# Patient Record
Sex: Female | Born: 1945 | Race: Black or African American | Hispanic: No | Marital: Single | Smoking: Never smoker
Health system: Southern US, Community
[De-identification: ages and names within clinical notes are randomized; demographics above are authoritative.]

## PROBLEM LIST (undated history)

## (undated) DIAGNOSIS — C801 Malignant (primary) neoplasm, unspecified: Secondary | ICD-10-CM

## (undated) DIAGNOSIS — F329 Major depressive disorder, single episode, unspecified: Secondary | ICD-10-CM

## (undated) DIAGNOSIS — R196 Halitosis: Secondary | ICD-10-CM

## (undated) DIAGNOSIS — F32A Depression, unspecified: Secondary | ICD-10-CM

## (undated) DIAGNOSIS — R569 Unspecified convulsions: Secondary | ICD-10-CM

## (undated) DIAGNOSIS — N183 Chronic kidney disease, stage 3 unspecified: Secondary | ICD-10-CM

## (undated) DIAGNOSIS — I10 Essential (primary) hypertension: Secondary | ICD-10-CM

## (undated) DIAGNOSIS — M1990 Unspecified osteoarthritis, unspecified site: Secondary | ICD-10-CM

## (undated) DIAGNOSIS — R634 Abnormal weight loss: Secondary | ICD-10-CM

## (undated) DIAGNOSIS — I619 Nontraumatic intracerebral hemorrhage, unspecified: Secondary | ICD-10-CM

## (undated) DIAGNOSIS — B0239 Other herpes zoster eye disease: Secondary | ICD-10-CM

## (undated) DIAGNOSIS — F1411 Cocaine abuse, in remission: Secondary | ICD-10-CM

## (undated) DIAGNOSIS — E785 Hyperlipidemia, unspecified: Secondary | ICD-10-CM

## (undated) DIAGNOSIS — D649 Anemia, unspecified: Secondary | ICD-10-CM

## (undated) HISTORY — DX: Unspecified osteoarthritis, unspecified site: M19.90

## (undated) HISTORY — DX: Cocaine abuse, in remission: F14.11

## (undated) HISTORY — DX: Malignant (primary) neoplasm, unspecified: C80.1

## (undated) HISTORY — DX: Essential (primary) hypertension: I10

## (undated) HISTORY — DX: Halitosis: R19.6

## (undated) HISTORY — DX: Hyperlipidemia, unspecified: E78.5

## (undated) HISTORY — DX: Major depressive disorder, single episode, unspecified: F32.9

## (undated) HISTORY — DX: Other herpes zoster eye disease: B02.39

## (undated) HISTORY — DX: Chronic kidney disease, stage 3 (moderate): N18.3

## (undated) HISTORY — DX: Abnormal weight loss: R63.4

## (undated) HISTORY — DX: Chronic kidney disease, stage 3 unspecified: N18.30

## (undated) HISTORY — DX: Unspecified convulsions: R56.9

## (undated) HISTORY — DX: Anemia, unspecified: D64.9

## (undated) HISTORY — DX: Depression, unspecified: F32.A

## (undated) HISTORY — DX: Nontraumatic intracerebral hemorrhage, unspecified: I61.9

---

## 2003-02-08 ENCOUNTER — Inpatient Hospital Stay (HOSPITAL_COMMUNITY): Admission: EM | Admit: 2003-02-08 | Discharge: 2003-02-11 | Payer: Self-pay | Admitting: Emergency Medicine

## 2003-04-22 DIAGNOSIS — I619 Nontraumatic intracerebral hemorrhage, unspecified: Secondary | ICD-10-CM

## 2003-04-22 HISTORY — DX: Nontraumatic intracerebral hemorrhage, unspecified: I61.9

## 2003-05-11 ENCOUNTER — Inpatient Hospital Stay (HOSPITAL_COMMUNITY): Admission: EM | Admit: 2003-05-11 | Discharge: 2003-05-20 | Payer: Self-pay | Admitting: Emergency Medicine

## 2003-05-12 ENCOUNTER — Encounter (INDEPENDENT_AMBULATORY_CARE_PROVIDER_SITE_OTHER): Payer: Self-pay | Admitting: Cardiology

## 2003-05-20 ENCOUNTER — Inpatient Hospital Stay (HOSPITAL_COMMUNITY)
Admission: RE | Admit: 2003-05-20 | Discharge: 2003-06-05 | Payer: Self-pay | Admitting: Physical Medicine & Rehabilitation

## 2003-05-21 ENCOUNTER — Encounter: Admission: RE | Admit: 2003-05-21 | Discharge: 2003-05-21 | Payer: Self-pay | Admitting: Family Medicine

## 2003-07-16 ENCOUNTER — Encounter
Admission: RE | Admit: 2003-07-16 | Discharge: 2003-10-14 | Payer: Self-pay | Admitting: Physical Medicine & Rehabilitation

## 2003-10-14 ENCOUNTER — Encounter
Admission: RE | Admit: 2003-10-14 | Discharge: 2003-12-12 | Payer: Self-pay | Admitting: Physical Medicine & Rehabilitation

## 2003-12-01 ENCOUNTER — Encounter
Admission: RE | Admit: 2003-12-01 | Discharge: 2003-12-12 | Payer: Self-pay | Admitting: Physical Medicine & Rehabilitation

## 2003-12-02 ENCOUNTER — Ambulatory Visit: Payer: Self-pay | Admitting: Physical Medicine & Rehabilitation

## 2004-01-29 ENCOUNTER — Ambulatory Visit: Payer: Self-pay | Admitting: Nurse Practitioner

## 2004-02-24 ENCOUNTER — Ambulatory Visit (HOSPITAL_COMMUNITY): Admission: RE | Admit: 2004-02-24 | Discharge: 2004-02-24 | Payer: Self-pay | Admitting: Family Medicine

## 2004-03-01 ENCOUNTER — Ambulatory Visit: Payer: Self-pay | Admitting: Nurse Practitioner

## 2004-03-01 ENCOUNTER — Encounter (INDEPENDENT_AMBULATORY_CARE_PROVIDER_SITE_OTHER): Payer: Self-pay | Admitting: Hospitalist

## 2004-03-01 ENCOUNTER — Other Ambulatory Visit: Admission: RE | Admit: 2004-03-01 | Discharge: 2004-03-01 | Payer: Self-pay | Admitting: Family Medicine

## 2004-03-01 LAB — CONVERTED CEMR LAB: Pap Smear: NORMAL

## 2004-05-24 ENCOUNTER — Encounter
Admission: RE | Admit: 2004-05-24 | Discharge: 2004-08-22 | Payer: Self-pay | Admitting: Physical Medicine & Rehabilitation

## 2004-06-10 ENCOUNTER — Ambulatory Visit: Payer: Self-pay | Admitting: Nurse Practitioner

## 2004-06-25 ENCOUNTER — Ambulatory Visit: Payer: Self-pay | Admitting: Physical Medicine & Rehabilitation

## 2004-08-26 ENCOUNTER — Ambulatory Visit: Payer: Self-pay | Admitting: Nurse Practitioner

## 2005-03-30 ENCOUNTER — Ambulatory Visit (HOSPITAL_COMMUNITY): Admission: RE | Admit: 2005-03-30 | Discharge: 2005-03-30 | Payer: Self-pay | Admitting: Family Medicine

## 2005-04-06 ENCOUNTER — Ambulatory Visit: Payer: Self-pay | Admitting: Nurse Practitioner

## 2005-07-05 ENCOUNTER — Ambulatory Visit: Payer: Self-pay | Admitting: Nurse Practitioner

## 2005-09-19 ENCOUNTER — Ambulatory Visit (HOSPITAL_BASED_OUTPATIENT_CLINIC_OR_DEPARTMENT_OTHER): Admission: RE | Admit: 2005-09-19 | Discharge: 2005-09-19 | Payer: Self-pay | Admitting: Ophthalmology

## 2005-10-03 ENCOUNTER — Ambulatory Visit (HOSPITAL_BASED_OUTPATIENT_CLINIC_OR_DEPARTMENT_OTHER): Admission: RE | Admit: 2005-10-03 | Discharge: 2005-10-03 | Payer: Self-pay | Admitting: Ophthalmology

## 2005-10-25 ENCOUNTER — Ambulatory Visit: Payer: Self-pay | Admitting: Nurse Practitioner

## 2005-10-28 ENCOUNTER — Ambulatory Visit (HOSPITAL_COMMUNITY): Admission: RE | Admit: 2005-10-28 | Discharge: 2005-10-28 | Payer: Self-pay | Admitting: Nurse Practitioner

## 2006-03-01 ENCOUNTER — Ambulatory Visit: Payer: Self-pay | Admitting: Hospitalist

## 2006-03-01 LAB — CONVERTED CEMR LAB
BUN: 35 mg/dL — ABNORMAL HIGH (ref 6–23)
CO2: 27 meq/L (ref 19–32)
Calcium: 10.4 mg/dL (ref 8.4–10.5)
Chloride: 102 meq/L (ref 96–112)
Creatinine, Ser: 1.38 mg/dL — ABNORMAL HIGH (ref 0.40–1.20)
Glucose, Bld: 94 mg/dL (ref 70–99)
Potassium: 3.9 meq/L (ref 3.5–5.3)
Sodium: 140 meq/L (ref 135–145)

## 2006-03-07 ENCOUNTER — Encounter (INDEPENDENT_AMBULATORY_CARE_PROVIDER_SITE_OTHER): Payer: Self-pay | Admitting: Hospitalist

## 2006-03-07 DIAGNOSIS — Z9189 Other specified personal risk factors, not elsewhere classified: Secondary | ICD-10-CM | POA: Insufficient documentation

## 2006-03-07 DIAGNOSIS — I10 Essential (primary) hypertension: Secondary | ICD-10-CM | POA: Insufficient documentation

## 2006-03-07 DIAGNOSIS — G8190 Hemiplegia, unspecified affecting unspecified side: Secondary | ICD-10-CM | POA: Insufficient documentation

## 2006-03-07 DIAGNOSIS — D509 Iron deficiency anemia, unspecified: Secondary | ICD-10-CM

## 2006-03-07 DIAGNOSIS — E785 Hyperlipidemia, unspecified: Secondary | ICD-10-CM

## 2006-03-08 ENCOUNTER — Encounter (INDEPENDENT_AMBULATORY_CARE_PROVIDER_SITE_OTHER): Payer: Self-pay | Admitting: *Deleted

## 2006-03-08 ENCOUNTER — Ambulatory Visit: Payer: Self-pay | Admitting: *Deleted

## 2006-03-08 LAB — CONVERTED CEMR LAB
BUN: 26 mg/dL — ABNORMAL HIGH (ref 6–23)
CO2: 27 meq/L (ref 19–32)
Calcium: 10.2 mg/dL (ref 8.4–10.5)
Chloride: 104 meq/L (ref 96–112)
Creatinine, Ser: 1.46 mg/dL — ABNORMAL HIGH (ref 0.40–1.20)
Glucose, Bld: 89 mg/dL (ref 70–99)
Potassium: 4 meq/L (ref 3.5–5.3)
Sodium: 141 meq/L (ref 135–145)

## 2006-05-01 ENCOUNTER — Ambulatory Visit (HOSPITAL_COMMUNITY): Admission: RE | Admit: 2006-05-01 | Discharge: 2006-05-01 | Payer: Self-pay | Admitting: Obstetrics and Gynecology

## 2006-08-22 ENCOUNTER — Telehealth (INDEPENDENT_AMBULATORY_CARE_PROVIDER_SITE_OTHER): Payer: Self-pay | Admitting: *Deleted

## 2006-08-28 ENCOUNTER — Telehealth: Payer: Self-pay | Admitting: *Deleted

## 2006-09-15 ENCOUNTER — Ambulatory Visit: Payer: Self-pay | Admitting: Internal Medicine

## 2006-09-15 ENCOUNTER — Encounter (INDEPENDENT_AMBULATORY_CARE_PROVIDER_SITE_OTHER): Payer: Self-pay | Admitting: *Deleted

## 2006-09-18 LAB — CONVERTED CEMR LAB
ALT: <8 units/L (ref 0–35)
AST: 14 units/L (ref 0–37)
Albumin: 5.1 g/dL (ref 3.5–5.2)
Alkaline Phosphatase: 57 units/L (ref 39–117)
Amphetamine Screen, Ur: NEGATIVE
BUN: 35 mg/dL — ABNORMAL HIGH (ref 6–23)
Barbiturate Quant, Ur: NEGATIVE
Benzodiazepines.: NEGATIVE
CO2: 26 meq/L (ref 19–32)
Calcium: 10.7 mg/dL — ABNORMAL HIGH (ref 8.4–10.5)
Chloride: 103 meq/L (ref 96–112)
Cholesterol: 202 mg/dL — ABNORMAL HIGH (ref 0–200)
Cocaine Metabolites: NEGATIVE
Creatinine, Ser: 1.45 mg/dL — ABNORMAL HIGH (ref 0.40–1.20)
Creatinine,U: 203.9 mg/dL
Glucose, Bld: 90 mg/dL (ref 70–99)
HDL: 72 mg/dL (ref >39)
LDL Cholesterol: 112 mg/dL — ABNORMAL HIGH (ref 0–99)
Marijuana Metabolite: NEGATIVE
Methadone: NEGATIVE
Opiates: NEGATIVE
Phencyclidine (PCP): NEGATIVE
Potassium: 3.9 meq/L (ref 3.5–5.3)
Propoxyphene: NEGATIVE
Sodium: 141 meq/L (ref 135–145)
Total Bilirubin: 0.8 mg/dL (ref 0.3–1.2)
Total CHOL/HDL Ratio: 2.8
Total Protein: 8.4 g/dL — ABNORMAL HIGH (ref 6.0–8.3)
Triglycerides: 92 mg/dL (ref <150)
VLDL: 18 mg/dL (ref 0–40)

## 2006-11-21 ENCOUNTER — Ambulatory Visit: Payer: Self-pay | Admitting: Hospitalist

## 2006-11-21 LAB — CONVERTED CEMR LAB
BUN: 22 mg/dL (ref 6–23)
Basophils Absolute: 0 10*3/uL (ref 0.0–0.1)
Basophils Relative: 1 % (ref 0–1)
CO2: 22 meq/L (ref 19–32)
Calcium: 10.5 mg/dL (ref 8.4–10.5)
Chloride: 102 meq/L (ref 96–112)
Creatinine, Ser: 1.34 mg/dL — ABNORMAL HIGH (ref 0.40–1.20)
Eosinophils Absolute: 0.1 10*3/uL (ref 0.0–0.7)
Eosinophils Relative: 1 % (ref 0–5)
Glucose, Bld: 85 mg/dL (ref 70–99)
HCT: 36.9 % (ref 36.0–46.0)
Hemoglobin: 11.8 g/dL — ABNORMAL LOW (ref 12.0–15.0)
Lymphocytes Relative: 27 % (ref 12–46)
Lymphs Abs: 1.4 10*3/uL (ref 0.7–3.3)
MCHC: 32 g/dL (ref 30.0–36.0)
MCV: 92 fL (ref 78.0–100.0)
Monocytes Absolute: 0.5 10*3/uL (ref 0.2–0.7)
Monocytes Relative: 9 % (ref 3–11)
Neutro Abs: 3.2 10*3/uL (ref 1.7–7.7)
Neutrophils Relative %: 62 % (ref 43–77)
Platelets: 209 10*3/uL (ref 150–400)
Potassium: 3.6 meq/L (ref 3.5–5.3)
RBC: 4.01 M/uL (ref 3.87–5.11)
RDW: 12.7 % (ref 11.5–14.0)
Sodium: 142 meq/L (ref 135–145)
WBC: 5.2 10*3/uL (ref 4.0–10.5)

## 2007-02-08 ENCOUNTER — Ambulatory Visit: Payer: Self-pay | Admitting: Hospitalist

## 2007-02-09 ENCOUNTER — Telehealth: Payer: Self-pay | Admitting: *Deleted

## 2007-02-09 ENCOUNTER — Telehealth (INDEPENDENT_AMBULATORY_CARE_PROVIDER_SITE_OTHER): Payer: Self-pay | Admitting: Hospitalist

## 2007-03-06 ENCOUNTER — Ambulatory Visit: Payer: Self-pay | Admitting: Hospitalist

## 2007-03-06 LAB — CONVERTED CEMR LAB
ALT: 10 units/L (ref 0–35)
AST: 23 units/L (ref 0–37)
Albumin: 4.9 g/dL (ref 3.5–5.2)
Alkaline Phosphatase: 78 units/L (ref 39–117)
BUN: 25 mg/dL — ABNORMAL HIGH (ref 6–23)
CO2: 21 meq/L (ref 19–32)
Calcium: 10.4 mg/dL (ref 8.4–10.5)
Chloride: 103 meq/L (ref 96–112)
Cholesterol: 204 mg/dL — ABNORMAL HIGH (ref 0–200)
Creatinine, Ser: 1.2 mg/dL (ref 0.40–1.20)
Glucose, Bld: 86 mg/dL (ref 70–99)
HDL: 72 mg/dL (ref >39)
LDL Cholesterol: 117 mg/dL — ABNORMAL HIGH (ref 0–99)
Potassium: 4.1 meq/L (ref 3.5–5.3)
Sodium: 142 meq/L (ref 135–145)
TSH: 3.008 microintl units/mL (ref 0.350–5.50)
Total Bilirubin: 0.8 mg/dL (ref 0.3–1.2)
Total CHOL/HDL Ratio: 2.8
Total Protein: 8.5 g/dL — ABNORMAL HIGH (ref 6.0–8.3)
Triglycerides: 73 mg/dL (ref <150)
VLDL: 15 mg/dL (ref 0–40)

## 2007-05-03 ENCOUNTER — Ambulatory Visit: Payer: Self-pay | Admitting: Hospitalist

## 2007-06-07 ENCOUNTER — Ambulatory Visit: Payer: Self-pay | Admitting: Hospitalist

## 2007-06-13 ENCOUNTER — Ambulatory Visit (HOSPITAL_COMMUNITY): Admission: RE | Admit: 2007-06-13 | Discharge: 2007-06-13 | Payer: Self-pay | Admitting: Hospitalist

## 2007-06-20 DIAGNOSIS — B0230 Zoster ocular disease, unspecified: Secondary | ICD-10-CM

## 2007-06-20 HISTORY — DX: Zoster ocular disease, unspecified: B02.30

## 2007-07-12 ENCOUNTER — Ambulatory Visit: Payer: Self-pay | Admitting: Hospitalist

## 2007-07-12 LAB — CONVERTED CEMR LAB
BUN: 22 mg/dL (ref 6–23)
CO2: 25 meq/L (ref 19–32)
Calcium: 10.5 mg/dL (ref 8.4–10.5)
Cholesterol: 166 mg/dL (ref 0–200)
Creatinine, Ser: 1.2 mg/dL (ref 0.40–1.20)
Glucose, Bld: 96 mg/dL (ref 70–99)
Total Bilirubin: 0.6 mg/dL (ref 0.3–1.2)
Total CHOL/HDL Ratio: 2.4
Triglycerides: 64 mg/dL (ref <150)
VLDL: 13 mg/dL (ref 0–40)

## 2007-07-17 ENCOUNTER — Ambulatory Visit: Payer: Self-pay | Admitting: Hospitalist

## 2007-09-12 ENCOUNTER — Ambulatory Visit: Payer: Self-pay | Admitting: Hospitalist

## 2007-11-05 ENCOUNTER — Telehealth: Payer: Self-pay | Admitting: *Deleted

## 2007-11-09 ENCOUNTER — Ambulatory Visit: Payer: Self-pay | Admitting: *Deleted

## 2007-12-13 ENCOUNTER — Encounter: Payer: Self-pay | Admitting: Internal Medicine

## 2007-12-13 ENCOUNTER — Ambulatory Visit: Payer: Self-pay | Admitting: Internal Medicine

## 2007-12-14 LAB — CONVERTED CEMR LAB
AST: 17 units/L (ref 0–37)
Alkaline Phosphatase: 62 units/L (ref 39–117)
BUN: 17 mg/dL (ref 6–23)
Calcium: 10.8 mg/dL — ABNORMAL HIGH (ref 8.4–10.5)
Creatinine, Ser: 1.19 mg/dL (ref 0.40–1.20)
Glucose, Bld: 90 mg/dL (ref 70–99)
HDL: 75 mg/dL (ref >39)
Total CHOL/HDL Ratio: 2.2
Triglycerides: 66 mg/dL (ref <150)

## 2007-12-27 ENCOUNTER — Ambulatory Visit: Payer: Self-pay | Admitting: Internal Medicine

## 2007-12-27 ENCOUNTER — Encounter: Payer: Self-pay | Admitting: Internal Medicine

## 2007-12-28 DIAGNOSIS — N189 Chronic kidney disease, unspecified: Secondary | ICD-10-CM | POA: Insufficient documentation

## 2007-12-28 LAB — CONVERTED CEMR LAB
CO2: 29 meq/L (ref 19–32)
Calcium: 10.8 mg/dL — ABNORMAL HIGH (ref 8.4–10.5)
Creatinine, Ser: 1.66 mg/dL — ABNORMAL HIGH (ref 0.40–1.20)
Glucose, Bld: 91 mg/dL (ref 70–99)
Sodium: 145 meq/L (ref 135–145)

## 2008-01-01 ENCOUNTER — Encounter: Payer: Self-pay | Admitting: Internal Medicine

## 2008-01-01 ENCOUNTER — Ambulatory Visit: Payer: Self-pay | Admitting: Internal Medicine

## 2008-01-01 ENCOUNTER — Encounter (INDEPENDENT_AMBULATORY_CARE_PROVIDER_SITE_OTHER): Payer: Self-pay | Admitting: *Deleted

## 2008-01-03 LAB — CONVERTED CEMR LAB
CO2: 25 meq/L (ref 19–32)
Calcium: 10.2 mg/dL (ref 8.4–10.5)
Creatinine, Ser: 1.35 mg/dL — ABNORMAL HIGH (ref 0.40–1.20)
Sodium: 144 meq/L (ref 135–145)

## 2008-01-16 ENCOUNTER — Telehealth: Payer: Self-pay | Admitting: *Deleted

## 2008-01-29 ENCOUNTER — Ambulatory Visit: Payer: Self-pay | Admitting: *Deleted

## 2008-01-29 LAB — CONVERTED CEMR LAB
BUN: 21 mg/dL (ref 6–23)
CO2: 29 meq/L (ref 19–32)
Chloride: 107 meq/L (ref 96–112)
Creatinine, Ser: 1.22 mg/dL — ABNORMAL HIGH (ref 0.40–1.20)
Glucose, Bld: 108 mg/dL — ABNORMAL HIGH (ref 70–99)
HCT: 36.3 % (ref 36.0–46.0)
Hemoglobin: 12.1 g/dL (ref 12.0–15.0)
Potassium: 3.2 meq/L — ABNORMAL LOW (ref 3.5–5.3)
RBC: 3.87 M/uL (ref 3.87–5.11)
RDW: 13.8 % (ref 11.5–15.5)
WBC: 5 10*3/uL (ref 4.0–10.5)

## 2008-02-07 ENCOUNTER — Encounter (INDEPENDENT_AMBULATORY_CARE_PROVIDER_SITE_OTHER): Payer: Self-pay | Admitting: *Deleted

## 2008-03-04 ENCOUNTER — Ambulatory Visit: Payer: Self-pay | Admitting: *Deleted

## 2008-03-04 LAB — CONVERTED CEMR LAB
BUN: 22 mg/dL (ref 6–23)
Barbiturate Quant, Ur: NEGATIVE
CO2: 21 meq/L (ref 19–32)
Chloride: 110 meq/L (ref 96–112)
Creatinine, Ser: 1.13 mg/dL (ref 0.40–1.20)
Creatinine, Urine: 287.3 mg/dL
Creatinine,U: 285.4 mg/dL
Glucose, Bld: 90 mg/dL (ref 70–99)
Marijuana Metabolite: NEGATIVE
Opiates: NEGATIVE
Phencyclidine (PCP): NEGATIVE
Potassium: 3.6 meq/L (ref 3.5–5.3)
Propoxyphene: NEGATIVE

## 2008-03-11 ENCOUNTER — Ambulatory Visit: Payer: Self-pay | Admitting: Internal Medicine

## 2008-03-27 ENCOUNTER — Encounter (INDEPENDENT_AMBULATORY_CARE_PROVIDER_SITE_OTHER): Payer: Self-pay | Admitting: *Deleted

## 2008-03-27 ENCOUNTER — Ambulatory Visit: Payer: Self-pay | Admitting: Infectious Disease

## 2008-03-27 LAB — CONVERTED CEMR LAB
BUN: 21 mg/dL (ref 6–23)
Calcium: 10.2 mg/dL (ref 8.4–10.5)
Chloride: 106 meq/L (ref 96–112)
Creatinine, Ser: 1.19 mg/dL (ref 0.40–1.20)

## 2008-04-02 ENCOUNTER — Encounter (INDEPENDENT_AMBULATORY_CARE_PROVIDER_SITE_OTHER): Payer: Self-pay | Admitting: Internal Medicine

## 2008-04-11 ENCOUNTER — Encounter: Payer: Self-pay | Admitting: Internal Medicine

## 2008-04-11 ENCOUNTER — Encounter (INDEPENDENT_AMBULATORY_CARE_PROVIDER_SITE_OTHER): Payer: Self-pay | Admitting: *Deleted

## 2008-04-11 ENCOUNTER — Encounter: Admission: RE | Admit: 2008-04-11 | Discharge: 2008-04-11 | Payer: Self-pay | Admitting: Gastroenterology

## 2008-04-11 DIAGNOSIS — D499 Neoplasm of unspecified behavior of unspecified site: Secondary | ICD-10-CM | POA: Insufficient documentation

## 2008-04-16 ENCOUNTER — Ambulatory Visit: Payer: Self-pay | Admitting: Internal Medicine

## 2008-04-16 ENCOUNTER — Encounter (INDEPENDENT_AMBULATORY_CARE_PROVIDER_SITE_OTHER): Payer: Self-pay | Admitting: *Deleted

## 2008-04-17 ENCOUNTER — Telehealth: Payer: Self-pay | Admitting: Internal Medicine

## 2008-04-17 ENCOUNTER — Telehealth (INDEPENDENT_AMBULATORY_CARE_PROVIDER_SITE_OTHER): Payer: Self-pay | Admitting: Internal Medicine

## 2008-04-29 ENCOUNTER — Ambulatory Visit (HOSPITAL_COMMUNITY): Admission: RE | Admit: 2008-04-29 | Discharge: 2008-04-29 | Payer: Self-pay | Admitting: *Deleted

## 2008-04-29 ENCOUNTER — Ambulatory Visit: Payer: Self-pay | Admitting: *Deleted

## 2008-05-02 ENCOUNTER — Ambulatory Visit: Payer: Self-pay | Admitting: Hematology & Oncology

## 2008-05-07 ENCOUNTER — Encounter (INDEPENDENT_AMBULATORY_CARE_PROVIDER_SITE_OTHER): Payer: Self-pay | Admitting: *Deleted

## 2008-05-13 ENCOUNTER — Ambulatory Visit: Payer: Self-pay | Admitting: *Deleted

## 2008-05-21 ENCOUNTER — Encounter (INDEPENDENT_AMBULATORY_CARE_PROVIDER_SITE_OTHER): Payer: Self-pay | Admitting: *Deleted

## 2008-05-21 LAB — CBC WITH DIFFERENTIAL (CANCER CENTER ONLY)
BASO#: 0 10*3/uL (ref 0.0–0.2)
EOS%: 1.1 % (ref 0.0–7.0)
Eosinophils Absolute: 0.1 10*3/uL (ref 0.0–0.5)
HGB: 13.1 g/dL (ref 11.6–15.9)
LYMPH%: 21.7 % (ref 14.0–48.0)
MCH: 30.4 pg (ref 26.0–34.0)
MCHC: 32.8 g/dL (ref 32.0–36.0)
MCV: 93 fL (ref 81–101)
MONO%: 5.1 % (ref 0.0–13.0)
NEUT#: 4.5 10*3/uL (ref 1.5–6.5)
Platelets: 241 10*3/uL (ref 145–400)
RBC: 4.31 10*6/uL (ref 3.70–5.32)

## 2008-05-27 LAB — COMPREHENSIVE METABOLIC PANEL
AST: 19 U/L (ref 0–37)
Alkaline Phosphatase: 66 U/L (ref 39–117)
BUN: 21 mg/dL (ref 6–23)
Calcium: 10.7 mg/dL — ABNORMAL HIGH (ref 8.4–10.5)
Creatinine, Ser: 1.08 mg/dL (ref 0.40–1.20)
Total Bilirubin: 0.7 mg/dL (ref 0.3–1.2)

## 2008-05-28 ENCOUNTER — Encounter (INDEPENDENT_AMBULATORY_CARE_PROVIDER_SITE_OTHER): Payer: Self-pay | Admitting: General Surgery

## 2008-05-28 ENCOUNTER — Inpatient Hospital Stay (HOSPITAL_COMMUNITY): Admission: RE | Admit: 2008-05-28 | Discharge: 2008-06-02 | Payer: Self-pay | Admitting: General Surgery

## 2008-06-02 LAB — 5 HIAA, QUANTITATIVE, URINE, 24 HOUR
5-HIAA, 24 Hr Urine: 219.9 mg/24 h — ABNORMAL HIGH (ref <=6.0)
Volume, Urine-5HIAA: 550 mL/24 h

## 2008-06-18 ENCOUNTER — Ambulatory Visit: Payer: Self-pay | Admitting: Hematology & Oncology

## 2008-06-19 ENCOUNTER — Encounter (INDEPENDENT_AMBULATORY_CARE_PROVIDER_SITE_OTHER): Payer: Self-pay | Admitting: *Deleted

## 2008-06-19 LAB — CBC WITH DIFFERENTIAL (CANCER CENTER ONLY)
BASO#: 0 10*3/uL (ref 0.0–0.2)
Eosinophils Absolute: 0.1 10*3/uL (ref 0.0–0.5)
HCT: 33.3 % — ABNORMAL LOW (ref 34.8–46.6)
HGB: 10.9 g/dL — ABNORMAL LOW (ref 11.6–15.9)
LYMPH#: 1.6 10*3/uL (ref 0.9–3.3)
MCHC: 32.7 g/dL (ref 32.0–36.0)
MONO#: 0.3 10*3/uL (ref 0.1–0.9)
NEUT#: 2.8 10*3/uL (ref 1.5–6.5)
NEUT%: 58.4 % (ref 39.6–80.0)
RBC: 3.59 10*6/uL — ABNORMAL LOW (ref 3.70–5.32)
WBC: 4.8 10*3/uL (ref 3.9–10.0)

## 2008-06-24 ENCOUNTER — Ambulatory Visit: Payer: Self-pay | Admitting: *Deleted

## 2008-06-26 LAB — COMPREHENSIVE METABOLIC PANEL
ALT: 12 U/L (ref 0–35)
AST: 13 U/L (ref 0–37)
Albumin: 4.5 g/dL (ref 3.5–5.2)
Alkaline Phosphatase: 56 U/L (ref 39–117)
BUN: 20 mg/dL (ref 6–23)
Chloride: 105 mEq/L (ref 96–112)
Potassium: 3.5 mEq/L (ref 3.5–5.3)
Sodium: 139 mEq/L (ref 135–145)
Total Protein: 7.1 g/dL (ref 6.0–8.3)

## 2008-07-09 ENCOUNTER — Ambulatory Visit (HOSPITAL_COMMUNITY): Admission: RE | Admit: 2008-07-09 | Discharge: 2008-07-09 | Payer: Self-pay | Admitting: Internal Medicine

## 2008-07-09 LAB — HM MAMMOGRAPHY: HM Mammogram: NEGATIVE

## 2008-07-17 ENCOUNTER — Encounter (INDEPENDENT_AMBULATORY_CARE_PROVIDER_SITE_OTHER): Payer: Self-pay | Admitting: *Deleted

## 2008-07-17 LAB — COMPREHENSIVE METABOLIC PANEL
Albumin: 4.9 g/dL (ref 3.5–5.2)
Alkaline Phosphatase: 77 U/L (ref 39–117)
BUN: 16 mg/dL (ref 6–23)
Creatinine, Ser: 0.97 mg/dL (ref 0.40–1.20)
Glucose, Bld: 86 mg/dL (ref 70–99)
Potassium: 3.7 mEq/L (ref 3.5–5.3)

## 2008-07-17 LAB — CBC WITH DIFFERENTIAL (CANCER CENTER ONLY)
BASO#: 0 10*3/uL (ref 0.0–0.2)
EOS%: 1.3 % (ref 0.0–7.0)
Eosinophils Absolute: 0.1 10*3/uL (ref 0.0–0.5)
HCT: 36.5 % (ref 34.8–46.6)
HGB: 12.2 g/dL (ref 11.6–15.9)
MCH: 30.4 pg (ref 26.0–34.0)
MCHC: 33.3 g/dL (ref 32.0–36.0)
MONO%: 4.8 % (ref 0.0–13.0)
NEUT#: 3.1 10*3/uL (ref 1.5–6.5)
NEUT%: 66.7 % (ref 39.6–80.0)
RBC: 4 10*6/uL (ref 3.70–5.32)

## 2008-07-21 ENCOUNTER — Telehealth: Payer: Self-pay | Admitting: *Deleted

## 2008-08-13 ENCOUNTER — Ambulatory Visit: Payer: Self-pay | Admitting: Hematology & Oncology

## 2008-08-14 ENCOUNTER — Encounter (INDEPENDENT_AMBULATORY_CARE_PROVIDER_SITE_OTHER): Payer: Self-pay | Admitting: *Deleted

## 2008-08-14 LAB — CBC WITH DIFFERENTIAL (CANCER CENTER ONLY)
BASO%: 0.4 % (ref 0.0–2.0)
EOS%: 1.3 % (ref 0.0–7.0)
HCT: 37.3 % (ref 34.8–46.6)
LYMPH#: 1.7 10*3/uL (ref 0.9–3.3)
MCHC: 33.4 g/dL (ref 32.0–36.0)
MONO#: 0.3 10*3/uL (ref 0.1–0.9)
NEUT#: 3.4 10*3/uL (ref 1.5–6.5)
NEUT%: 61.9 % (ref 39.6–80.0)
RDW: 12.1 % (ref 10.5–14.6)
WBC: 5.5 10*3/uL (ref 3.9–10.0)

## 2008-08-20 LAB — LACTATE DEHYDROGENASE: LDH: 164 U/L (ref 94–250)

## 2008-08-20 LAB — COMPREHENSIVE METABOLIC PANEL
ALT: 17 U/L (ref 0–35)
CO2: 19 mEq/L (ref 19–32)
Creatinine, Ser: 1.22 mg/dL — ABNORMAL HIGH (ref 0.40–1.20)
Total Bilirubin: 0.6 mg/dL (ref 0.3–1.2)

## 2008-08-20 LAB — CHROMOGRANIN A: Chromogranin A: 149.3 ng/mL — ABNORMAL HIGH (ref <=36.4)

## 2008-09-05 ENCOUNTER — Telehealth: Payer: Self-pay | Admitting: *Deleted

## 2008-09-16 ENCOUNTER — Ambulatory Visit: Payer: Self-pay | Admitting: Hematology & Oncology

## 2008-10-07 ENCOUNTER — Telehealth (INDEPENDENT_AMBULATORY_CARE_PROVIDER_SITE_OTHER): Payer: Self-pay | Admitting: Internal Medicine

## 2008-10-15 ENCOUNTER — Ambulatory Visit: Payer: Self-pay | Admitting: Internal Medicine

## 2008-10-15 ENCOUNTER — Encounter (INDEPENDENT_AMBULATORY_CARE_PROVIDER_SITE_OTHER): Payer: Self-pay | Admitting: Internal Medicine

## 2008-10-16 ENCOUNTER — Ambulatory Visit: Payer: Self-pay | Admitting: Vascular Surgery

## 2008-10-16 ENCOUNTER — Encounter (INDEPENDENT_AMBULATORY_CARE_PROVIDER_SITE_OTHER): Payer: Self-pay | Admitting: Internal Medicine

## 2008-10-16 ENCOUNTER — Ambulatory Visit (HOSPITAL_COMMUNITY): Admission: RE | Admit: 2008-10-16 | Discharge: 2008-10-16 | Payer: Self-pay | Admitting: Internal Medicine

## 2008-10-16 LAB — CONVERTED CEMR LAB
BUN: 14 mg/dL (ref 6–23)
CO2: 26 meq/L (ref 19–32)
Calcium: 9.9 mg/dL (ref 8.4–10.5)
Chloride: 103 meq/L (ref 96–112)
Creatinine, Ser: 1.14 mg/dL (ref 0.40–1.20)
Glucose, Bld: 99 mg/dL (ref 70–99)
HCT: 37.3 % (ref 36.0–46.0)
HDL: 73 mg/dL (ref >39)
Hemoglobin: 12.4 g/dL (ref 12.0–15.0)
LDL Cholesterol: 74 mg/dL (ref 0–99)
Platelets: 254 10*3/uL (ref 150–400)
RBC: 4.15 M/uL (ref 3.87–5.11)
Total Bilirubin: 0.8 mg/dL (ref 0.3–1.2)
Total CHOL/HDL Ratio: 2.2
VLDL: 17 mg/dL (ref 0–40)
WBC: 5.5 10*3/uL (ref 4.0–10.5)

## 2008-11-05 ENCOUNTER — Ambulatory Visit: Payer: Self-pay | Admitting: Hematology & Oncology

## 2008-11-06 ENCOUNTER — Encounter (INDEPENDENT_AMBULATORY_CARE_PROVIDER_SITE_OTHER): Payer: Self-pay | Admitting: Internal Medicine

## 2008-11-06 LAB — CBC WITH DIFFERENTIAL (CANCER CENTER ONLY)
BASO%: 0.5 % (ref 0.0–2.0)
EOS%: 1.6 % (ref 0.0–7.0)
LYMPH#: 1.4 10*3/uL (ref 0.9–3.3)
LYMPH%: 27.9 % (ref 14.0–48.0)
MCHC: 34 g/dL (ref 32.0–36.0)
MCV: 90 fL (ref 81–101)
MONO#: 0.2 10*3/uL (ref 0.1–0.9)
Platelets: 220 10*3/uL (ref 145–400)
RDW: 13.1 % (ref 10.5–14.6)
WBC: 4.9 10*3/uL (ref 3.9–10.0)

## 2008-11-14 LAB — COMPREHENSIVE METABOLIC PANEL
Albumin: 4.6 g/dL (ref 3.5–5.2)
Alkaline Phosphatase: 93 U/L (ref 39–117)
CO2: 24 mEq/L (ref 19–32)
Calcium: 10.3 mg/dL (ref 8.4–10.5)
Chloride: 106 mEq/L (ref 96–112)
Glucose, Bld: 137 mg/dL — ABNORMAL HIGH (ref 70–99)
Potassium: 3.4 mEq/L — ABNORMAL LOW (ref 3.5–5.3)
Sodium: 143 mEq/L (ref 135–145)
Total Protein: 7.2 g/dL (ref 6.0–8.3)

## 2008-11-14 LAB — CHROMOGRANIN A: Chromogranin A: 90 ng/mL — ABNORMAL HIGH (ref 1.9–15.0)

## 2008-11-26 ENCOUNTER — Telehealth (INDEPENDENT_AMBULATORY_CARE_PROVIDER_SITE_OTHER): Payer: Self-pay | Admitting: Internal Medicine

## 2008-12-18 ENCOUNTER — Ambulatory Visit: Payer: Self-pay | Admitting: Radiology

## 2008-12-18 ENCOUNTER — Ambulatory Visit (HOSPITAL_BASED_OUTPATIENT_CLINIC_OR_DEPARTMENT_OTHER): Admission: RE | Admit: 2008-12-18 | Discharge: 2008-12-18 | Payer: Self-pay | Admitting: Hematology & Oncology

## 2008-12-31 ENCOUNTER — Ambulatory Visit: Payer: Self-pay | Admitting: Hematology & Oncology

## 2009-01-29 ENCOUNTER — Encounter (INDEPENDENT_AMBULATORY_CARE_PROVIDER_SITE_OTHER): Payer: Self-pay | Admitting: Internal Medicine

## 2009-01-29 LAB — CBC WITH DIFFERENTIAL (CANCER CENTER ONLY)
BASO%: 0.4 % (ref 0.0–2.0)
LYMPH%: 25.2 % (ref 14.0–48.0)
MCH: 30.5 pg (ref 26.0–34.0)
MCV: 89 fL (ref 81–101)
MONO%: 5.8 % (ref 0.0–13.0)
NEUT%: 67.1 % (ref 39.6–80.0)
Platelets: 230 10*3/uL (ref 145–400)
RDW: 12 % (ref 10.5–14.6)

## 2009-02-03 LAB — COMPREHENSIVE METABOLIC PANEL
AST: 24 U/L (ref 0–37)
Alkaline Phosphatase: 79 U/L (ref 39–117)
BUN: 18 mg/dL (ref 6–23)
Creatinine, Ser: 1.21 mg/dL — ABNORMAL HIGH (ref 0.40–1.20)
Total Bilirubin: 0.9 mg/dL (ref 0.3–1.2)

## 2009-02-24 ENCOUNTER — Ambulatory Visit: Payer: Self-pay | Admitting: Hematology & Oncology

## 2009-02-26 ENCOUNTER — Encounter (INDEPENDENT_AMBULATORY_CARE_PROVIDER_SITE_OTHER): Payer: Self-pay | Admitting: Internal Medicine

## 2009-02-26 LAB — CBC WITH DIFFERENTIAL (CANCER CENTER ONLY)
BASO#: 0 10*3/uL (ref 0.0–0.2)
EOS%: 1.2 % (ref 0.0–7.0)
HGB: 13.5 g/dL (ref 11.6–15.9)
LYMPH#: 1.7 10*3/uL (ref 0.9–3.3)
MCHC: 34.4 g/dL (ref 32.0–36.0)
NEUT#: 3.5 10*3/uL (ref 1.5–6.5)
RBC: 4.33 10*6/uL (ref 3.70–5.32)
WBC: 5.7 10*3/uL (ref 3.9–10.0)

## 2009-03-04 LAB — COMPREHENSIVE METABOLIC PANEL
AST: 23 U/L (ref 0–37)
Alkaline Phosphatase: 84 U/L (ref 39–117)
BUN: 24 mg/dL — ABNORMAL HIGH (ref 6–23)
Creatinine, Ser: 1.27 mg/dL — ABNORMAL HIGH (ref 0.40–1.20)
Total Bilirubin: 0.9 mg/dL (ref 0.3–1.2)

## 2009-03-31 ENCOUNTER — Ambulatory Visit: Payer: Self-pay | Admitting: Hematology & Oncology

## 2009-04-02 ENCOUNTER — Ambulatory Visit (HOSPITAL_BASED_OUTPATIENT_CLINIC_OR_DEPARTMENT_OTHER): Admission: RE | Admit: 2009-04-02 | Discharge: 2009-04-02 | Payer: Self-pay | Admitting: Hematology & Oncology

## 2009-04-02 ENCOUNTER — Ambulatory Visit: Payer: Self-pay | Admitting: Diagnostic Radiology

## 2009-04-02 ENCOUNTER — Encounter (INDEPENDENT_AMBULATORY_CARE_PROVIDER_SITE_OTHER): Payer: Self-pay | Admitting: Internal Medicine

## 2009-04-02 LAB — CBC WITH DIFFERENTIAL (CANCER CENTER ONLY)
BASO#: 0 10*3/uL (ref 0.0–0.2)
Eosinophils Absolute: 0.1 10*3/uL (ref 0.0–0.5)
HCT: 41.3 % (ref 34.8–46.6)
HGB: 13.8 g/dL (ref 11.6–15.9)
LYMPH%: 24.7 % (ref 14.0–48.0)
MCH: 31.1 pg (ref 26.0–34.0)
MCV: 93 fL (ref 81–101)
MONO#: 0.4 10*3/uL (ref 0.1–0.9)
MONO%: 5.7 % (ref 0.0–13.0)
NEUT%: 68.4 % (ref 39.6–80.0)
RBC: 4.46 10*6/uL (ref 3.70–5.32)
WBC: 7.5 10*3/uL (ref 3.9–10.0)

## 2009-04-07 LAB — COMPREHENSIVE METABOLIC PANEL
Albumin: 5.1 g/dL (ref 3.5–5.2)
Alkaline Phosphatase: 88 U/L (ref 39–117)
BUN: 20 mg/dL (ref 6–23)
CO2: 20 mEq/L (ref 19–32)
Glucose, Bld: 102 mg/dL — ABNORMAL HIGH (ref 70–99)
Sodium: 139 mEq/L (ref 135–145)
Total Bilirubin: 1.1 mg/dL (ref 0.3–1.2)
Total Protein: 8.2 g/dL (ref 6.0–8.3)

## 2009-04-07 LAB — CHROMOGRANIN A: Chromogranin A: 64 ng/mL — ABNORMAL HIGH (ref 1.9–15.0)

## 2009-04-29 ENCOUNTER — Encounter (INDEPENDENT_AMBULATORY_CARE_PROVIDER_SITE_OTHER): Payer: Self-pay | Admitting: Internal Medicine

## 2009-04-29 LAB — CBC WITH DIFFERENTIAL (CANCER CENTER ONLY)
BASO#: 0 10*3/uL (ref 0.0–0.2)
EOS%: 1.1 % (ref 0.0–7.0)
Eosinophils Absolute: 0.1 10*3/uL (ref 0.0–0.5)
HCT: 39.4 % (ref 34.8–46.6)
HGB: 13.3 g/dL (ref 11.6–15.9)
LYMPH#: 1.6 10*3/uL (ref 0.9–3.3)
MONO#: 0.3 10*3/uL (ref 0.1–0.9)
NEUT#: 4.1 10*3/uL (ref 1.5–6.5)
NEUT%: 66.7 % (ref 39.6–80.0)
RBC: 4.26 10*6/uL (ref 3.70–5.32)
WBC: 6.2 10*3/uL (ref 3.9–10.0)

## 2009-05-06 LAB — COMPREHENSIVE METABOLIC PANEL
AST: 22 U/L (ref 0–37)
Albumin: 4.6 g/dL (ref 3.5–5.2)
BUN: 17 mg/dL (ref 6–23)
CO2: 28 mEq/L (ref 19–32)
Calcium: 10.3 mg/dL (ref 8.4–10.5)
Chloride: 101 mEq/L (ref 96–112)
Creatinine, Ser: 1.2 mg/dL (ref 0.40–1.20)
Glucose, Bld: 83 mg/dL (ref 70–99)
Potassium: 3.4 mEq/L — ABNORMAL LOW (ref 3.5–5.3)

## 2009-05-06 LAB — CHROMOGRANIN A

## 2009-05-26 ENCOUNTER — Ambulatory Visit: Payer: Self-pay | Admitting: Hematology & Oncology

## 2009-05-27 LAB — CBC WITH DIFFERENTIAL (CANCER CENTER ONLY)
BASO#: 0 10*3/uL (ref 0.0–0.2)
EOS%: 1.2 % (ref 0.0–7.0)
Eosinophils Absolute: 0.1 10*3/uL (ref 0.0–0.5)
HGB: 13.4 g/dL (ref 11.6–15.9)
LYMPH#: 1.7 10*3/uL (ref 0.9–3.3)
MCH: 30.6 pg (ref 26.0–34.0)
MCHC: 33 g/dL (ref 32.0–36.0)
MONO%: 6.1 % (ref 0.0–13.0)
NEUT#: 3.1 10*3/uL (ref 1.5–6.5)
Platelets: 229 10*3/uL (ref 145–400)
RBC: 4.37 10*6/uL (ref 3.70–5.32)

## 2009-05-29 LAB — BASIC METABOLIC PANEL
BUN: 31 mg/dL — ABNORMAL HIGH (ref 6–23)
Chloride: 104 mEq/L (ref 96–112)
Potassium: 3 mEq/L — ABNORMAL LOW (ref 3.5–5.3)
Sodium: 145 mEq/L (ref 135–145)

## 2009-05-29 LAB — CHROMOGRANIN A: Chromogranin A: 86 ng/mL — ABNORMAL HIGH (ref 1.9–15.0)

## 2009-06-17 ENCOUNTER — Ambulatory Visit: Payer: Self-pay | Admitting: Internal Medicine

## 2009-06-25 ENCOUNTER — Ambulatory Visit: Payer: Self-pay | Admitting: Hematology & Oncology

## 2009-07-23 ENCOUNTER — Ambulatory Visit (HOSPITAL_BASED_OUTPATIENT_CLINIC_OR_DEPARTMENT_OTHER): Admission: RE | Admit: 2009-07-23 | Discharge: 2009-07-23 | Payer: Self-pay | Admitting: Hematology & Oncology

## 2009-07-23 ENCOUNTER — Encounter (INDEPENDENT_AMBULATORY_CARE_PROVIDER_SITE_OTHER): Payer: Self-pay | Admitting: Internal Medicine

## 2009-07-23 ENCOUNTER — Ambulatory Visit: Payer: Self-pay | Admitting: Diagnostic Radiology

## 2009-07-23 LAB — CBC WITH DIFFERENTIAL (CANCER CENTER ONLY)
BASO#: 0 10*3/uL (ref 0.0–0.2)
Eosinophils Absolute: 0.1 10*3/uL (ref 0.0–0.5)
HCT: 40.9 % (ref 34.8–46.6)
HGB: 13.6 g/dL (ref 11.6–15.9)
LYMPH#: 1.8 10*3/uL (ref 0.9–3.3)
LYMPH%: 27.5 % (ref 14.0–48.0)
MCV: 92 fL (ref 81–101)
MONO#: 0.4 10*3/uL (ref 0.1–0.9)
NEUT%: 65.2 % (ref 39.6–80.0)
RBC: 4.43 10*6/uL (ref 3.70–5.32)
WBC: 6.6 10*3/uL (ref 3.9–10.0)

## 2009-07-23 LAB — CMP (CANCER CENTER ONLY)
CO2: 30 mEq/L (ref 18–33)
Calcium: 10.4 mg/dL — ABNORMAL HIGH (ref 8.0–10.3)
Chloride: 96 mEq/L — ABNORMAL LOW (ref 98–108)
Creat: 1.1 mg/dl (ref 0.6–1.2)
Glucose, Bld: 106 mg/dL (ref 73–118)
Total Bilirubin: 1.5 mg/dl (ref 0.20–1.60)

## 2009-07-28 LAB — CHROMOGRANIN A: Chromogranin A: 58 ng/mL — ABNORMAL HIGH (ref 1.9–15.0)

## 2009-08-21 ENCOUNTER — Ambulatory Visit: Payer: Self-pay | Admitting: Hematology & Oncology

## 2009-08-24 ENCOUNTER — Encounter: Payer: Self-pay | Admitting: Internal Medicine

## 2009-08-24 LAB — CBC WITH DIFFERENTIAL (CANCER CENTER ONLY)
EOS%: 1.3 % (ref 0.0–7.0)
MCH: 31.6 pg (ref 26.0–34.0)
MCHC: 34.1 g/dL (ref 32.0–36.0)
MONO%: 5.7 % (ref 0.0–13.0)
NEUT#: 3.6 10*3/uL (ref 1.5–6.5)
Platelets: 247 10*3/uL (ref 145–400)
RBC: 4.23 10*6/uL (ref 3.70–5.32)

## 2009-08-31 LAB — COMPREHENSIVE METABOLIC PANEL
AST: 20 U/L (ref 0–37)
Albumin: 4.9 g/dL (ref 3.5–5.2)
Alkaline Phosphatase: 69 U/L (ref 39–117)
BUN: 28 mg/dL — ABNORMAL HIGH (ref 6–23)
Creatinine, Ser: 1.41 mg/dL — ABNORMAL HIGH (ref 0.40–1.20)
Potassium: 3.5 mEq/L (ref 3.5–5.3)
Total Bilirubin: 0.8 mg/dL (ref 0.3–1.2)

## 2009-09-04 ENCOUNTER — Ambulatory Visit: Payer: Self-pay | Admitting: Internal Medicine

## 2009-10-08 ENCOUNTER — Ambulatory Visit: Payer: Self-pay | Admitting: Hematology & Oncology

## 2009-10-12 ENCOUNTER — Encounter: Payer: Self-pay | Admitting: Internal Medicine

## 2009-10-12 LAB — CBC WITH DIFFERENTIAL (CANCER CENTER ONLY)
BASO#: 0 10*3/uL (ref 0.0–0.2)
BASO%: 0.4 % (ref 0.0–2.0)
Eosinophils Absolute: 0.1 10*3/uL (ref 0.0–0.5)
HCT: 35.9 % (ref 34.8–46.6)
HGB: 12 g/dL (ref 11.6–15.9)
LYMPH#: 1.7 10*3/uL (ref 0.9–3.3)
MONO#: 0.3 10*3/uL (ref 0.1–0.9)
NEUT%: 61.9 % (ref 39.6–80.0)
RBC: 3.87 10*6/uL (ref 3.70–5.32)
RDW: 11.2 % (ref 10.5–14.6)
WBC: 5.3 10*3/uL (ref 3.9–10.0)

## 2009-10-15 LAB — COMPREHENSIVE METABOLIC PANEL
ALT: 16 U/L (ref 0–35)
CO2: 28 mEq/L (ref 19–32)
Calcium: 10.2 mg/dL (ref 8.4–10.5)
Chloride: 108 mEq/L (ref 96–112)
Creatinine, Ser: 1.22 mg/dL — ABNORMAL HIGH (ref 0.40–1.20)
Glucose, Bld: 108 mg/dL — ABNORMAL HIGH (ref 70–99)

## 2009-10-15 LAB — CHROMOGRANIN A: Chromogranin A: 56 ng/mL — ABNORMAL HIGH (ref 1.9–15.0)

## 2009-11-10 ENCOUNTER — Ambulatory Visit: Payer: Self-pay | Admitting: Internal Medicine

## 2009-11-13 ENCOUNTER — Ambulatory Visit: Payer: Self-pay | Admitting: Hematology & Oncology

## 2009-11-16 ENCOUNTER — Encounter: Payer: Self-pay | Admitting: Internal Medicine

## 2009-11-16 LAB — CBC WITH DIFFERENTIAL (CANCER CENTER ONLY)
BASO#: 0 10*3/uL (ref 0.0–0.2)
EOS%: 1.2 % (ref 0.0–7.0)
Eosinophils Absolute: 0.1 10*3/uL (ref 0.0–0.5)
HGB: 12.7 g/dL (ref 11.6–15.9)
LYMPH%: 25.1 % (ref 14.0–48.0)
MCH: 31.4 pg (ref 26.0–34.0)
MCHC: 33.8 g/dL (ref 32.0–36.0)
MCV: 93 fL (ref 81–101)
MONO%: 6.2 % (ref 0.0–13.0)
RBC: 4.05 10*6/uL (ref 3.70–5.32)

## 2009-11-18 LAB — COMPREHENSIVE METABOLIC PANEL
AST: 32 U/L (ref 0–37)
Alkaline Phosphatase: 69 U/L (ref 39–117)
BUN: 28 mg/dL — ABNORMAL HIGH (ref 6–23)
Creatinine, Ser: 1.39 mg/dL — ABNORMAL HIGH (ref 0.40–1.20)
Glucose, Bld: 115 mg/dL — ABNORMAL HIGH (ref 70–99)
Total Bilirubin: 0.9 mg/dL (ref 0.3–1.2)

## 2009-11-18 LAB — CHROMOGRANIN A: Chromogranin A: 208 ng/mL — ABNORMAL HIGH (ref 1.9–15.0)

## 2009-11-26 ENCOUNTER — Telehealth: Payer: Self-pay | Admitting: Internal Medicine

## 2010-01-01 ENCOUNTER — Ambulatory Visit: Payer: Self-pay | Admitting: Hematology & Oncology

## 2010-01-04 ENCOUNTER — Encounter: Payer: Self-pay | Admitting: Internal Medicine

## 2010-01-04 LAB — CBC WITH DIFFERENTIAL (CANCER CENTER ONLY)
BASO%: 0.5 % (ref 0.0–2.0)
EOS%: 1.1 % (ref 0.0–7.0)
HCT: 37.8 % (ref 34.8–46.6)
HGB: 12.6 g/dL (ref 11.6–15.9)
LYMPH#: 1.6 10*3/uL (ref 0.9–3.3)
MCHC: 33.3 g/dL (ref 32.0–36.0)
MONO#: 0.5 10*3/uL (ref 0.1–0.9)
NEUT#: 6.3 10*3/uL (ref 1.5–6.5)
RDW: 11.8 % (ref 10.5–14.6)
WBC: 8.6 10*3/uL (ref 3.9–10.0)

## 2010-01-06 LAB — CHROMOGRANIN A

## 2010-01-06 LAB — COMPREHENSIVE METABOLIC PANEL
ALT: 13 U/L (ref 0–35)
AST: 18 U/L (ref 0–37)
Albumin: 5 g/dL (ref 3.5–5.2)
CO2: 28 mEq/L (ref 19–32)
Calcium: 10.9 mg/dL — ABNORMAL HIGH (ref 8.4–10.5)
Chloride: 104 mEq/L (ref 96–112)
Potassium: 3.8 mEq/L (ref 3.5–5.3)
Total Protein: 7.5 g/dL (ref 6.0–8.3)

## 2010-02-01 ENCOUNTER — Ambulatory Visit: Payer: Self-pay | Admitting: Hematology & Oncology

## 2010-03-01 ENCOUNTER — Encounter: Payer: Self-pay | Admitting: Internal Medicine

## 2010-03-01 LAB — CBC WITH DIFFERENTIAL (CANCER CENTER ONLY)
BASO#: 0 10*3/uL (ref 0.0–0.2)
BASO%: 0.6 % (ref 0.0–2.0)
EOS%: 1 % (ref 0.0–7.0)
MCH: 30.4 pg (ref 26.0–34.0)
MCHC: 32.7 g/dL (ref 32.0–36.0)
MONO%: 7.8 % (ref 0.0–13.0)
NEUT#: 3.7 10*3/uL (ref 1.5–6.5)
Platelets: 272 10*3/uL (ref 145–400)
RDW: 11.8 % (ref 10.5–14.6)

## 2010-03-04 LAB — COMPREHENSIVE METABOLIC PANEL
AST: 21 U/L (ref 0–37)
Albumin: 5.3 g/dL — ABNORMAL HIGH (ref 3.5–5.2)
Alkaline Phosphatase: 69 U/L (ref 39–117)
BUN: 23 mg/dL (ref 6–23)
Potassium: 3.5 mEq/L (ref 3.5–5.3)
Sodium: 141 mEq/L (ref 135–145)

## 2010-03-05 ENCOUNTER — Telehealth: Payer: Self-pay | Admitting: Internal Medicine

## 2010-03-26 ENCOUNTER — Ambulatory Visit: Payer: Self-pay | Admitting: Hematology & Oncology

## 2010-03-29 LAB — CBC WITH DIFFERENTIAL (CANCER CENTER ONLY)
BASO#: 0 10*3/uL (ref 0.0–0.2)
BASO%: 0.4 % (ref 0.0–2.0)
EOS%: 1 % (ref 0.0–7.0)
Eosinophils Absolute: 0.1 10*3/uL (ref 0.0–0.5)
HCT: 37.5 % (ref 34.8–46.6)
HGB: 12.4 g/dL (ref 11.6–15.9)
LYMPH#: 1.5 10*3/uL (ref 0.9–3.3)
LYMPH%: 24.2 % (ref 14.0–48.0)
MCH: 30.5 pg (ref 26.0–34.0)
MCHC: 33 g/dL (ref 32.0–36.0)
MCV: 92 fL (ref 81–101)
MONO#: 0.4 10*3/uL (ref 0.1–0.9)
MONO%: 6.3 % (ref 0.0–13.0)
NEUT#: 4.2 10*3/uL (ref 1.5–6.5)
NEUT%: 68.1 % (ref 39.6–80.0)
Platelets: 237 10*3/uL (ref 145–400)
RBC: 4.06 10*6/uL (ref 3.70–5.32)
RDW: 11.3 % (ref 10.5–14.6)
WBC: 6.2 10*3/uL (ref 3.9–10.0)

## 2010-04-01 LAB — COMPREHENSIVE METABOLIC PANEL
ALT: 11 U/L (ref 0–35)
AST: 16 U/L (ref 0–37)
Albumin: 4.7 g/dL (ref 3.5–5.2)
Alkaline Phosphatase: 57 U/L (ref 39–117)
BUN: 19 mg/dL (ref 6–23)
CO2: 30 mEq/L (ref 19–32)
Calcium: 10.4 mg/dL (ref 8.4–10.5)
Chloride: 106 mEq/L (ref 96–112)
Creatinine, Ser: 1.18 mg/dL (ref 0.40–1.20)
Glucose, Bld: 93 mg/dL (ref 70–99)
Potassium: 3.9 mEq/L (ref 3.5–5.3)
Sodium: 145 mEq/L (ref 135–145)
Total Bilirubin: 0.5 mg/dL (ref 0.3–1.2)
Total Protein: 7.7 g/dL (ref 6.0–8.3)

## 2010-04-01 LAB — CHROMOGRANIN A: Chromogranin A: 74 ng/mL — ABNORMAL HIGH (ref 1.9–15.0)

## 2010-04-01 LAB — LACTATE DEHYDROGENASE: LDH: 135 U/L (ref 94–250)

## 2010-04-11 ENCOUNTER — Encounter: Payer: Self-pay | Admitting: Hematology & Oncology

## 2010-04-18 LAB — CONVERTED CEMR LAB
ALT: 27 units/L (ref 0–35)
AST: 21 units/L (ref 0–37)
Alkaline Phosphatase: 69 units/L (ref 39–117)
BUN: 21 mg/dL (ref 6–23)
Creatinine, Ser: 1.02 mg/dL (ref 0.40–1.20)
INR: 0.9 (ref 0.0–1.5)
MCHC: 34.1 g/dL (ref 30.0–36.0)
Potassium: 3.6 meq/L (ref 3.5–5.3)
Prothrombin Time: 12.1 s (ref 11.6–15.2)
RBC: 4.13 M/uL (ref 3.87–5.11)
RDW: 12.7 % (ref 11.5–15.5)
aPTT: 32 s (ref 24–37)

## 2010-04-22 NOTE — Progress Notes (Signed)
Summary: Refill/gh  Phone Note Refill Request Message from:  Fax from Pharmacy on November 26, 2009 5:23 PM  Refills Requested: Medication #1:  ZOCOR 40 MG  TABS Take 1/2 tablet by mouth at bedtime for your cholesterol   Last Refilled: 09/25/2009  Method Requested: Electronic Initial call taken by: Angelina Ok RN,  November 26, 2009 5:23 PM    Prescriptions: ZOCOR 40 MG  TABS (SIMVASTATIN) Take 1/2 tablet by mouth at bedtime for your cholesterol  #30 x 3   Entered and Authorized by:   Darnelle Maffucci MD   Signed by:   Darnelle Maffucci MD on 11/27/2009   Method used:   Electronically to        [facility] [id number] (retail)       [address] [place], [place]  [address]       Ph: [phone]       Fax: [phone]   RxID:   [id number]

## 2010-04-22 NOTE — Consult Note (Signed)
Summary: [facility]  [facility]   Imported By: Margie Billet 09/17/2009 10:11:35  _____________________________________________________________________  External Attachment:    Type:   Image     Comment:   External Document

## 2010-04-22 NOTE — Consult Note (Signed)
Summary: [facility]  [facility]   Imported By: Louretta Parma 11/26/2009 10:37:48  _____________________________________________________________________  External Attachment:    Type:   Image     Comment:   External Document

## 2010-04-22 NOTE — Consult Note (Signed)
Summary: [facility]  [facility]   Imported By: Louretta Parma 01/12/2010 16:51:23  _____________________________________________________________________  External Attachment:    Type:   Image     Comment:   External Document

## 2010-04-22 NOTE — Consult Note (Signed)
[facility]   Imported By: Margie Billet 03/22/2010 13:53:53  _____________________________________________________________________  External Attachment:    Type:   Image     Comment:   External Document

## 2010-04-22 NOTE — Consult Note (Signed)
Summary[facility]  [facility]   Imported By: Florinda Marker 05/14/2009 14:15:31  _____________________________________________________________________  External Attachment:    Type:   Image     Comment:   External Document

## 2010-04-22 NOTE — Consult Note (Signed)
[facility]   Imported By: Florinda Marker 04/22/2009 15:28:25  _____________________________________________________________________  External Attachment:    Type:   Image     Comment:   External Document

## 2010-04-22 NOTE — Progress Notes (Signed)
Summary: refill/gg  Phone Note Refill Request  on March 05, 2010 4:33 PM  Refills Requested: Medication #1:  CATAPRES 0.2 MG TABS Take 1 tablet by mouth three times a day   Last Refilled: 02/04/2010  Method Requested: Electronic Initial call taken by: Merrie Roof RN,  March 05, 2010 4:33 PM  Follow-up for Phone Call        Refilled electronically.  Follow-up by: Margarito Liner MD,  March 08, 2010 5:44 PM    Prescriptions: CATAPRES 0.2 MG TABS (CLONIDINE HCL) Take 1 tablet by mouth three times a day  #90 x 2   Entered and Authorized by:   Margarito Liner MD   Signed by:   Margarito Liner MD on 03/08/2010   Method used:   Electronically to        [facility] [id number] (retail)       [address] [place], [place]  [address]       Ph: [phone]       Fax: [phone]   RxID:   [id number]

## 2010-04-22 NOTE — Assessment & Plan Note (Signed)
Summary: ACUTE-MED REFILLS/CFB([facility])   Vital Signs:  Patient profile:   65 year old female Height:      62 inches (157.48 cm) Weight:      114.1 pounds (51.86 kg) BMI:     20.94 Temp:     97.3 degrees F (36.28 degrees C) oral Pulse rate:   64 / minute BP sitting:   138 / 76  (right arm)  Vitals Entered By: Stanton Kidney Ditzler RN (September 04, 2009 2:17 PM) Is Patient Diabetic? No Pain Assessment Patient in pain? no      Nutritional Status BMI of 19 -24 = normal Nutritional Status Detail appetite good  Have you ever been in a relationship where you felt threatened, hurt or afraid?denies   Does patient need assistance? Functional Status Self care Ambulation Impaired:Risk for fall Comments Uses a cane. Refills on meds.    Primary Care Provider:  Joaquin Courts  MD   History of Present Illness: [name] comes for a f/u visit. She is here just to refill her meds. She has no complaints. She sees Dr. Myna Hidalgo in [facility]. She recently had labwork done with him.   Depression History:      The patient denies a depressed mood most of the day and a diminished interest in her usual daily activities.         Preventive Screening-Counseling & Management  Alcohol-Tobacco     Smoking Status: never  Caffeine-Diet-Exercise     Does Patient Exercise: yes     Type of exercise: ROM     Times/week: 5  Current Medications (verified): 1)  Zocor 40 Mg  Tabs (Simvastatin) .... Take 1/2 Tablet By Mouth At Bedtime For Your Cholesterol 2)  Catapres 0.2 Mg Tabs (Clonidine Hcl) .... Take 1 Tablet By Mouth Three Times A Day 3)  Multivitamin/iron   Tabs (Multiple Vitamins-Iron) 4)  Norvasc 10 Mg Tabs (Amlodipine Besylate) .... Take 1 Tablet By Mouth Once A Day 5)  Hydrochlorothiazide 25 Mg Tabs (Hydrochlorothiazide) .... Take 1 Tablet By Mouth Once A Day 6)  Sandostatin 50 Mcg/ml Soln (Octreotide Acetate)  Allergies: 1)  ! * "pollen"  Review of Systems      See HPI  Physical  Exam  Lungs:  normal breath sounds, no crackles, and no wheezes.   Heart:  normal rate, regular rhythm, no murmur, and no gallop.   Extremities:  trace left pedal edema and trace right pedal edema.   Neurologic:  alert & oriented X3.     Impression & Recommendations:  Problem # 1:  HYPERTENSION (ICD-401.9) BP improved and I refilled her clonidine.  Her updated medication list for this problem includes:    Catapres 0.2 Mg Tabs (Clonidine hcl) .Marland Kitchen... Take 1 tablet by mouth three times a day    Norvasc 10 Mg Tabs (Amlodipine besylate) .Marland Kitchen... Take 1 tablet by mouth once a day    Hydrochlorothiazide 25 Mg Tabs (Hydrochlorothiazide) .Marland Kitchen... Take 1 tablet by mouth once a day  BP today: 138/76 Prior BP: 178/78 (06/17/2009)  Labs Reviewed: K+: 3.7 (10/15/2008) Creat: : 1.14 (10/15/2008)   Chol: 164 (10/15/2008)   HDL: 73 (10/15/2008)   LDL: 74 (10/15/2008)   TG: 85 (10/15/2008)  Problem # 2:  HYPERLIPIDEMIA (ICD-272.4) She is taking her zocor. WIll check FLP when she comes fasting next.  Her updated medication list for this problem includes:    Zocor 40 Mg Tabs (Simvastatin) .Marland Kitchen... Take 1/2 tablet by mouth at bedtime for your cholesterol  Labs Reviewed:  SGOT: 25 (10/15/2008)   SGPT: 23 (10/15/2008)   HDL:73 (10/15/2008), 75 (12/13/2007)  LDL:74 (10/15/2008), 75 (12/13/2007)  Chol:164 (10/15/2008), 163 (12/13/2007)  Trig:85 (10/15/2008), 66 (12/13/2007)  Complete Medication List: 1)  Zocor 40 Mg Tabs (Simvastatin) .... Take 1/2 tablet by mouth at bedtime for your cholesterol 2)  Catapres 0.2 Mg Tabs (Clonidine hcl) .... Take 1 tablet by mouth three times a day 3)  Multivitamin/iron Tabs (Multiple vitamins-iron) 4)  Norvasc 10 Mg Tabs (Amlodipine besylate) .... Take 1 tablet by mouth once a day 5)  Hydrochlorothiazide 25 Mg Tabs (Hydrochlorothiazide) .... Take 1 tablet by mouth once a day 6)  Sandostatin 50 Mcg/ml Soln (Octreotide acetate)  Patient Instructions: 1)  Please schedule a  follow-up appointment in 2 months. 2)  Limit your Sodium (Salt) to less than 2 grams a day(slightly less than 1/2 a teaspoon) to prevent fluid retention, swelling, or worsening of symptoms. 3)  It is important that you exercise regularly at least 20 minutes 5 times a week. If you develop chest pain, have severe difficulty breathing, or feel very tired , stop exercising immediately and seek medical attention. 4)  See your eye doctor yearly to check for diabetic eye damage. 5)  Check your feet each night for sore areas, calluses or signs of infection. 6)  Check your Blood Pressure regularly. If it is above: you should make an appointment. Prescriptions: CATAPRES 0.2 MG TABS (CLONIDINE HCL) Take 1 tablet by mouth three times a day  #90 x 0   Entered and Authorized by:   Jason Coop MD   Signed by:   Jason Coop MD on 09/04/2009   Method used:   Electronically to        [facility] [id number] (retail)       [address] [place], [place]  [address]       Ph: [phone]       Fax: [phone]   RxID:   [id number]    Prevention & Chronic Care Immunizations   Influenza vaccine: refuses  (12/13/2007)   Influenza vaccine deferral: Deferred  (10/15/2008)    Tetanus booster: Not documented   Td booster deferral: Deferred  (10/15/2008)    Pneumococcal vaccine: Not documented    H. zoster vaccine: Not documented   H. zoster vaccine deferral: Deferred  (10/15/2008)  Colorectal Screening   Hemoccult: Not documented    Colonoscopy: Not documented   Colonoscopy action/deferral: Not indicated  (10/15/2008)  Other Screening   Pap smear: Normal  (03/01/2004)   Pap smear action/deferral: Not indicated-other  (10/15/2008)    Mammogram: ASSESSMENT: Negative - BI-RADS 1^MM DIGITAL SCREENING  (07/09/2008)   Mammogram action/deferral: Screening mammogram in 1 year.     (06/13/2007)   Mammogram due: 06/2008    DXA bone density scan: Not  documented   Smoking status: never  (09/04/2009)  Lipids   Total Cholesterol: 164  (10/15/2008)   LDL: 74  (10/15/2008)   LDL Direct: Not documented   HDL: 73  (10/15/2008)   Triglycerides: 85  (10/15/2008)    SGOT (AST): 25  (10/15/2008)   SGPT (ALT): 23  (10/15/2008)   Alkaline phosphatase: 97  (10/15/2008)   Total bilirubin: 0.8  (10/15/2008)    Lipid flowsheet reviewed?: Yes   Progress toward LDL goal: Unchanged  Hypertension   Last Blood Pressure: 138 / 76  (09/04/2009)   Serum creatinine: 1.14  (10/15/2008)  Serum potassium 3.7  (10/15/2008)    Hypertension flowsheet reviewed?: Yes   Progress toward BP goal: At goal  Self-Management Support :   Personal Goals (by the next clinic visit) :      Personal blood pressure goal: 140/90  (09/04/2009)     Personal LDL goal: 100  (09/04/2009)    Patient will work on the following items until the next clinic visit to reach self-care goals:     Medications and monitoring: take my medicines every day, check my blood pressure, bring all of my medications to every visit, weigh myself weekly  (09/04/2009)     Eating: eat more vegetables, use fresh or frozen vegetables, eat foods that are low in salt, eat baked foods instead of fried foods, eat fruit for snacks and desserts, limit or avoid alcohol  (09/04/2009)     Activity: take a 30 minute walk every day  (09/04/2009)    Hypertension self-management support: Written self-care plan, Education handout, Resources for patients handout  (09/04/2009)   Hypertension self-care plan printed.   Hypertension education handout printed    Lipid self-management support: Written self-care plan, Education handout, Resources for patients handout  (09/04/2009)   Lipid self-care plan printed.   Lipid education handout printed      Resource handout printed.

## 2010-04-22 NOTE — Assessment & Plan Note (Signed)
Summary: est-ck/fu/meds/cfb   Vital Signs:  Patient profile:   65 year old female Height:      62 inches (157.48 cm) Weight:      106.8 pounds (48.55 kg) BMI:     19.60 Temp:     97.2 degrees F (36.22 degrees C) oral Pulse rate:   57 / minute BP sitting:   123 / 66  (right arm)  Vitals Entered By: Stanton Kidney Ditzler RN (November 10, 2009 8:50 AM) Is Patient Diabetic? No Pain Assessment Patient in pain? no      Nutritional Status BMI of 19 -24 = normal Nutritional Status Detail appetite good  Have you ever been in a relationship where you felt threatened, hurt or afraid?denies   Does patient need assistance? Functional Status Self care Ambulation Impaired:Risk for fall Comments Uses a cane. FU BP.   Primary Care Provider:  Joaquin Courts  MD   History of Present Illness: Pt is a 65 yo f with PMH of carcinoid tumor, HTN, HLD presents to [facility] for followup.   Patient is doing well today, and denies any new complaints, BP is doing well, and will go on Monday to her oncologist for followup which she does monthly.    Patient is feeling well and denies CP, abdominal pain, nausea, vomiting, HA's, palpitations, blurred vision. fever, chills, diarrhea, constipation or SOB.   Depression History:      The patient denies a depressed mood most of the day and a diminished interest in her usual daily activities.         Preventive Screening-Counseling & Management  Alcohol-Tobacco     Smoking Status: never  Caffeine-Diet-Exercise     Does Patient Exercise: yes     Type of exercise: ROM     Times/week: 5  Current Medications (verified): 1)  Zocor 40 Mg  Tabs (Simvastatin) .... Take 1/2 Tablet By Mouth At Bedtime For Your Cholesterol 2)  Catapres 0.2 Mg Tabs (Clonidine Hcl) .... Take 1 Tablet By Mouth Three Times A Day 3)  Multivitamin/iron   Tabs (Multiple Vitamins-Iron) 4)  Norvasc 10 Mg Tabs (Amlodipine Besylate) .... Take 1 Tablet By Mouth Once A Day 5)  Hydrochlorothiazide 25 Mg  Tabs (Hydrochlorothiazide) .... Take 1 Tablet By Mouth Once A Day 6)  Sandostatin 50 Mcg/ml Soln (Octreotide Acetate)  Allergies: 1)  ! * "pollen"  Review of Systems       As per HPI  Physical Exam  General:  alert, pleasant, sitting in wheelchair w/ cane beside her, no distress.  Lungs:  normal breath sounds, no crackles, and no wheezes.   Heart:  normal rate, regular rhythm, no murmur, and no gallop.  S4 present.  Abdomen:  +BS's, soft, NT and ND Msk:  no joint swelling, no joint warmth, and no redness over joints.    Neurologic:  alert & oriented X3     Impression & Recommendations:  Problem # 1:  HYPERTENSION (ICD-401.9) Assessment Unchanged  Well controlled on current treatment, No new changes made today, Will continue to monitor.   Her updated medication list for this problem includes:    Catapres 0.2 Mg Tabs (Clonidine hcl) .Marland Kitchen... Take 1 tablet by mouth three times a day    Norvasc 10 Mg Tabs (Amlodipine besylate) .Marland Kitchen... Take 1 tablet by mouth once a day    Hydrochlorothiazide 25 Mg Tabs (Hydrochlorothiazide) .Marland Kitchen... Take 1 tablet by mouth once a day  BP today: 123/66 Prior BP: 138/76 (09/04/2009)  Labs Reviewed: K+: 3.7 (10/15/2008)  Creat: : 1.14 (10/15/2008)   Chol: 164 (10/15/2008)   HDL: 73 (10/15/2008)   LDL: 74 (10/15/2008)   TG: 85 (10/15/2008)  Problem # 2:  HYPERLIPIDEMIA (ICD-272.4) Assessment: Unchanged  Well controlled on current treatment, No new changes made today, Will continue to monitor.   Her updated medication list for this problem includes:    Zocor 40 Mg Tabs (Simvastatin) .Marland Kitchen... 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  Myna Hidalgo.  She is doing very well considering and follows w/ them monthly. next visit in on Monday 11/16/09.   Problem # 4:  Preventive Health Care (ICD-V70.0) Assessment: Comment Only uptodate with all screening.  Complete Medication List: 1)  Zocor 40 Mg Tabs (Simvastatin) .... Take 1/2 tablet by mouth at bedtime for your cholesterol 2)  Catapres 0.2 Mg Tabs (Clonidine hcl) .... Take 1 tablet by mouth three times a day 3)  Multivitamin/iron Tabs (Multiple vitamins-iron) 4)  Norvasc 10 Mg Tabs (Amlodipine besylate) .... Take 1 tablet by mouth once a day 5)  Hydrochlorothiazide 25 Mg Tabs (Hydrochlorothiazide) .... Take 1 tablet by mouth once a day 6)  Sandostatin 50 Mcg/ml Soln (Octreotide acetate)  Patient Instructions: 1)  Please schedule a follow-up appointment in 6 months.   Prevention & Chronic Care Immunizations   Influenza vaccine: refuses  (12/13/2007)   Influenza vaccine deferral: Deferred  (10/15/2008)    Tetanus booster: Not documented   Td booster deferral: Deferred  (10/15/2008)    Pneumococcal vaccine: Not documented    H. zoster vaccine: Not documented   H. zoster vaccine deferral: Deferred  (10/15/2008)  Colorectal Screening   Hemoccult: Not documented    Colonoscopy: Not documented   Colonoscopy action/deferral: Not indicated  (10/15/2008)  Other Screening   Pap smear: Normal  (03/01/2004)   Pap smear action/deferral: Not indicated-other  (10/15/2008)    Mammogram: ASSESSMENT: Negative - BI-RADS 1^MM DIGITAL SCREENING  (07/09/2008)   Mammogram action/deferral: Refused  (11/10/2009)   Mammogram due: 06/2008    DXA bone density scan: Not documented   DXA bone density action/deferral: Refused  (11/10/2009)   Smoking status: never  (11/10/2009)  Lipids   Total Cholesterol: 164  (10/15/2008)   LDL: 74  (10/15/2008)   LDL Direct: Not documented   HDL: 73  (10/15/2008)   Triglycerides: 85  (10/15/2008)    SGOT (AST): 25  (10/15/2008)   SGPT (ALT):  23  (10/15/2008)   Alkaline phosphatase: 97  (10/15/2008)   Total bilirubin: 0.8  (10/15/2008)    Lipid flowsheet reviewed?: Yes   Progress toward LDL goal: At goal  Hypertension   Last Blood Pressure: 123 / 66  (11/10/2009)   Serum creatinine: 1.14  (10/15/2008)   Serum potassium 3.7  (10/15/2008)    Hypertension flowsheet reviewed?: Yes   Progress toward BP goal: At goal  Self-Management Support :   Personal Goals (by the next clinic visit) :      Personal blood pressure goal: 140/90  (09/04/2009)     Personal LDL goal: 100  (09/04/2009)    Patient will work on the following items until the next clinic visit to reach self-care goals:     Medications and monitoring: take my medicines every day, check  my blood pressure, bring all of my medications to every visit, weigh myself weekly  (11/10/2009)     Eating: drink diet soda or water instead of juice or soda, eat more vegetables, use fresh or frozen vegetables, eat foods that are low in salt, eat baked foods instead of fried foods, eat fruit for snacks and desserts, limit or avoid alcohol  (11/10/2009)     Activity: take a 30 minute walk every day  (11/10/2009)    Hypertension self-management support: Written self-care plan, Education handout, Resources for patients handout  (11/10/2009)   Hypertension self-care plan printed.   Hypertension education handout printed    Lipid self-management support: Written self-care plan, Education handout, Resources for patients handout  (11/10/2009)   Lipid self-care plan printed.   Lipid education handout printed      Resource handout printed.

## 2010-04-22 NOTE — Consult Note (Signed)
Summary: [facility]  [facility]   Imported By: Louretta Parma 12/14/2009 16:00:55  _____________________________________________________________________  External Attachment:    Type:   Image     Comment:   External Document

## 2010-04-22 NOTE — Assessment & Plan Note (Signed)
Summary: est-ck/fu/meds/cfb   Vital Signs:  Patient profile:   65 year old female Height:      62 inches (157.48 cm) Weight:      116.5 pounds (52.95 kg) BMI:     21.39 Temp:     98.7 degrees F (37.06 degrees C) oral Pulse rate:   75 / minute BP sitting:   178 / 78  (right arm)  Vitals Entered By: Stanton Kidney Ditzler RN (June 17, 2009 3:18 PM) Is Patient Diabetic? No Pain Assessment Patient in pain? no      Nutritional Status BMI of 19 -24 = normal Nutritional Status Detail appetite good  Have you ever been in a relationship where you felt threatened, hurt or afraid?denies   Does patient need assistance? Functional Status Self care Ambulation Impaired:Risk for fall Comments Uses a cane. FU BP.   Primary Care Provider:  Joaquin Courts  MD   History of Present Illness: Pt is 65 yo female w/ past med hx below here for routine f/u.  She notes a callous on the bottom of her R foot that is hurting her and limiting her walking that has been bothering her off and on for quite some time.  She is seeing Dr. Daleen Squibb, podiatrist, for this.  She is in good spirits as usual and notes her cancer has not spread.  She takes her meds regularly and brought her pill bottles with her.   Depression History:      The patient denies a depressed mood most of the day and a diminished interest in her usual daily activities.         Preventive Screening-Counseling & Management  Alcohol-Tobacco     Smoking Status: never  Caffeine-Diet-Exercise     Does Patient Exercise: yes     Type of exercise: ROM     Times/week: 5  Current Medications (verified): 1)  Zocor 40 Mg  Tabs (Simvastatin) .... Take 1/2 Tablet By Mouth At Bedtime For Your Cholesterol 2)  Catapres 0.2 Mg Tabs (Clonidine Hcl) .... Take 1 Tablet By Mouth Two Times A Day 3)  Multivitamin/iron   Tabs (Multiple Vitamins-Iron) 4)  Norvasc 10 Mg Tabs (Amlodipine Besylate) .... Take 1 Tablet By Mouth Once A Day 5)  Hydrochlorothiazide  25 Mg Tabs (Hydrochlorothiazide) .... Take 1 Tablet By Mouth Once A Day 6)  Sandostatin 50 Mcg/ml Soln (Octreotide Acetate)  Allergies: 1)  ! * "pollen"  Past History:  Past Medical History: Last updated: 10/15/2008 Metastatic carcinoid ca:            -s/p bowel resection by Dr. [name] 03/10             -f/u w/ Dr. Myna Hidalgo w/ sandostatin injections q monthly Anemia-iron deficiency Depression Hyperlipidemia Hypertension Hx of cocaine abuse, quit 3/06  -  Seizure and HTN urgency secondary to use 12/04 Seizure  Left putamen hemorrhagic Cerebrovascular accident, hx of (2/05) -  5 x 2.5 cm in size L putamen hemorrhage -  Pronounced residual Right hemiparesis (arm and leg)  -  Prior ischemic lacunar infarcts (external capsule, left/caudal putamen, left thalamus seen on 11/04 Head CT) Hx of weight loss, 15 lbs 8/07 (regained, 104 8/07 and 112lb 12/07) Hx of arthritis Hx of halitosis, per notes HIV and RPR neg 11/05; 8/07 Right eye herpes zoster opthamlicus 4/09  Social History: Last updated: 02/08/2007 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  Divorced since the 1970's.  Never Smoked Alcohol use-no Drug use-no. She  only used cocaine once and states that she had the stroke immediately.  Regular exercise-yes  Social History: Reviewed history from 02/08/2007 and no changes required. 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  Divorced since the 1970's.  Never Smoked Alcohol use-no Drug use-no. She only used cocaine once and states that she had the stroke immediately.  Regular exercise-yes  Review of Systems       as per hpi.  Physical Exam  General:  alert, pleasant, sitting in wheelchair w/ cane beside her, no distress.  Eyes:  anicteric. Lungs:  Normal respiratory effort, chest expands symmetrically. Lungs are clear to auscultation, no crackles or wheezes. Heart:  Normal rate and regular rhythm.  S1 and S2 normal without gallop, murmur, click, rub or other extra sounds. Abdomen:  +BS's, soft, NT and ND Extremities:  1 cm circular calous noted on the bottom of her R foot.  Neurologic:  Walking w/ cane, limping on R foot.  Psych:  mood euthymic.    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 Myna Hidalgo.  She is doing very well considering and follows w/ them monthly.   Problem # 2:  HYPERTENSION (ICD-401.9) BP continues to be up.  She is open to taking clonidine 3x's a day so we will plan on increasing.  Aggressive BP control may not be necessary given her widespread carcinoid, however, I would like it a bit lower than what it is now b/c of her hx of prior CVA.  Consider adding betablocker at her next visit.  Her updated medication list for this problem includes:    Catapres 0.2 Mg Tabs (Clonidine hcl) .Marland Kitchen... Take 1 tablet by mouth three times a day    Norvasc 10 Mg Tabs (Amlodipine besylate) .Marland Kitchen... Take 1 tablet by mouth once a day    Hydrochlorothiazide 25 Mg Tabs (Hydrochlorothiazide) .Marland Kitchen... Take 1 tablet by mouth once a day  Problem # 3:  CALLUS, FOOT (ICD-700) F/u w/ podiatry.   Complete Medication List: 1)  Zocor 40 Mg Tabs (Simvastatin) .... Take 1/2 tablet by mouth at bedtime for your cholesterol 2)  Catapres 0.2 Mg Tabs (Clonidine hcl) .... Take 1 tablet by mouth three times a day 3)  Multivitamin/iron Tabs (Multiple vitamins-iron) 4)  Norvasc 10 Mg Tabs (Amlodipine besylate) .... Take 1 tablet by mouth once a day 5)  Hydrochlorothiazide 25 Mg Tabs (Hydrochlorothiazide) .... Take 1 tablet by mouth once a day 6)  Sandostatin 50 Mcg/ml Soln (Octreotide acetate)  Patient Instructions: 1)  Please make a followup appointment in 1 month for a checkup on your blood pressure. 2)  Please increase clonidine to three times a day.  3)  Please see Dr. Okey Dupre about your foot.  Prescriptions: CATAPRES 0.2 MG TABS (CLONIDINE HCL) Take 1  tablet by mouth three times a day  #90 x 3   Entered and Authorized by:   Joaquin Courts  MD   Signed by:   Joaquin Courts  MD on 06/17/2009   Method used:   Electronically to        [facility] [id number] (retail)       [address]       [place], [place]  [address]       Ph: [phone]       Fax: [phone]   RxID:   [id number]    Prevention & Chronic Care Immunizations  Influenza vaccine: refuses  (12/13/2007)   Influenza vaccine deferral: Deferred  (10/15/2008)    Tetanus booster: Not documented   Td booster deferral: Deferred  (10/15/2008)    Pneumococcal vaccine: Not documented    H. zoster vaccine: Not documented   H. zoster vaccine deferral: Deferred  (10/15/2008)  Colorectal Screening   Hemoccult: Not documented    Colonoscopy: Not documented   Colonoscopy action/deferral: Not indicated  (10/15/2008)  Other Screening   Pap smear: Normal  (03/01/2004)   Pap smear action/deferral: Not indicated-other  (10/15/2008)    Mammogram: ASSESSMENT: Negative - BI-RADS 1^MM DIGITAL SCREENING  (07/09/2008)   Mammogram action/deferral: Screening mammogram in 1 year.     (06/13/2007)   Mammogram due: 06/2008    DXA bone density scan: Not documented   Smoking status: never  (06/17/2009)  Lipids   Total Cholesterol: 164  (10/15/2008)   LDL: 74  (10/15/2008)   LDL Direct: Not documented   HDL: 73  (10/15/2008)   Triglycerides: 85  (10/15/2008)    SGOT (AST): 25  (10/15/2008)   SGPT (ALT): 23  (10/15/2008)   Alkaline phosphatase: 97  (10/15/2008)   Total bilirubin: 0.8  (10/15/2008)    Lipid flowsheet reviewed?: Yes   Progress toward LDL goal: At goal  Hypertension   Last Blood Pressure: 178 / 78  (06/17/2009)   Serum creatinine: 1.14  (10/15/2008)   Serum potassium 3.7  (10/15/2008)    Hypertension flowsheet reviewed?: Yes   Progress toward BP goal: Deteriorated  Self-Management Support :    Patient will work on the  following items until the next clinic visit to reach self-care goals:     Medications and monitoring: take my medicines every day, check my blood pressure, bring all of my medications to every visit  (06/17/2009)     Eating: eat more vegetables, use fresh or frozen vegetables, eat foods that are low in salt, eat baked foods instead of fried foods, eat fruit for snacks and desserts  (06/17/2009)     Activity: take a 30 minute walk every day, take the stairs instead of the elevator  (10/15/2008)    Hypertension self-management support: Written self-care plan, Education handout, Resources for patients handout  (06/17/2009)   Hypertension self-care plan printed.   Hypertension education handout printed    Lipid self-management support: Written self-care plan, Education handout, Resources for patients handout  (06/17/2009)   Lipid self-care plan printed.   Lipid education handout printed      Resource handout printed.   Appended Document: est-ck/fu/meds/cfb Called and clarified with pt that she needs to take clonidine 3X's a day and HCTZ only once a day. Patient/caller verbalizes understanding of these instructions.

## 2010-04-22 NOTE — Letter (Signed)
Summary: [facility]  [facility]   Imported By: Margie Billet 08/18/2009 12:19:02  _____________________________________________________________________  External Attachment:    Type:   Image     Comment:   External Document

## 2010-04-26 ENCOUNTER — Other Ambulatory Visit: Payer: Self-pay | Admitting: Hematology & Oncology

## 2010-04-26 ENCOUNTER — Encounter (HOSPITAL_BASED_OUTPATIENT_CLINIC_OR_DEPARTMENT_OTHER): Payer: PRIVATE HEALTH INSURANCE | Admitting: Hematology & Oncology

## 2010-04-26 DIAGNOSIS — C7A098 Malignant carcinoid tumors of other sites: Secondary | ICD-10-CM

## 2010-04-26 DIAGNOSIS — Z5111 Encounter for antineoplastic chemotherapy: Secondary | ICD-10-CM

## 2010-04-26 DIAGNOSIS — C787 Secondary malignant neoplasm of liver and intrahepatic bile duct: Secondary | ICD-10-CM

## 2010-04-26 LAB — CBC WITH DIFFERENTIAL (CANCER CENTER ONLY)
BASO#: 0 10*3/uL (ref 0.0–0.2)
LYMPH#: 1.9 10*3/uL (ref 0.9–3.3)
MCH: 30.7 pg (ref 26.0–34.0)
MCHC: 33.3 g/dL (ref 32.0–36.0)
MCV: 92 fL (ref 81–101)
MONO#: 0.4 10*3/uL (ref 0.1–0.9)
NEUT#: 3.9 10*3/uL (ref 1.5–6.5)
NEUT%: 62.2 % (ref 39.6–80.0)
RDW: 11.4 % (ref 10.5–14.6)

## 2010-04-28 LAB — COMPREHENSIVE METABOLIC PANEL
ALT: 18 U/L (ref 0–35)
AST: 20 U/L (ref 0–37)
CO2: 28 mEq/L (ref 19–32)
Calcium: 10.8 mg/dL — ABNORMAL HIGH (ref 8.4–10.5)
Chloride: 102 mEq/L (ref 96–112)
Creatinine, Ser: 1.1 mg/dL (ref 0.40–1.20)
Sodium: 142 mEq/L (ref 135–145)
Total Bilirubin: 0.7 mg/dL (ref 0.3–1.2)
Total Protein: 8.1 g/dL (ref 6.0–8.3)

## 2010-04-28 LAB — CHROMOGRANIN A

## 2010-05-06 ENCOUNTER — Other Ambulatory Visit: Payer: Self-pay | Admitting: Internal Medicine

## 2010-05-17 ENCOUNTER — Encounter: Payer: Self-pay | Admitting: Internal Medicine

## 2010-05-24 ENCOUNTER — Other Ambulatory Visit: Payer: Self-pay | Admitting: Family

## 2010-05-24 ENCOUNTER — Encounter (HOSPITAL_BASED_OUTPATIENT_CLINIC_OR_DEPARTMENT_OTHER): Payer: PRIVATE HEALTH INSURANCE | Admitting: Hematology & Oncology

## 2010-05-24 DIAGNOSIS — C787 Secondary malignant neoplasm of liver and intrahepatic bile duct: Secondary | ICD-10-CM

## 2010-05-24 DIAGNOSIS — C7A098 Malignant carcinoid tumors of other sites: Secondary | ICD-10-CM

## 2010-05-24 LAB — CBC WITH DIFFERENTIAL (CANCER CENTER ONLY)
BASO#: 0.1 10*3/uL (ref 0.0–0.2)
Eosinophils Absolute: 0.1 10*3/uL (ref 0.0–0.5)
HCT: 37.9 % (ref 34.8–46.6)
HGB: 12.6 g/dL (ref 11.6–15.9)
LYMPH%: 27.1 % (ref 14.0–48.0)
MCH: 30.4 pg (ref 26.0–34.0)
MCV: 91 fL (ref 81–101)
MONO#: 0.7 10*3/uL (ref 0.1–0.9)
MONO%: 8.7 % (ref 0.0–13.0)
NEUT%: 62.4 % (ref 39.6–80.0)
RBC: 4.15 10*6/uL (ref 3.70–5.32)
WBC: 7.8 10*3/uL (ref 3.9–10.0)

## 2010-05-27 LAB — COMPREHENSIVE METABOLIC PANEL
Albumin: 5.3 g/dL — ABNORMAL HIGH (ref 3.5–5.2)
CO2: 29 mEq/L (ref 19–32)
Glucose, Bld: 90 mg/dL (ref 70–99)
Sodium: 143 mEq/L (ref 135–145)
Total Bilirubin: 0.7 mg/dL (ref 0.3–1.2)
Total Protein: 8.3 g/dL (ref 6.0–8.3)

## 2010-05-27 LAB — CHROMOGRANIN A: Chromogranin A: 74 ng/mL — ABNORMAL HIGH (ref 1.9–15.0)

## 2010-06-04 ENCOUNTER — Other Ambulatory Visit: Payer: Self-pay | Admitting: Internal Medicine

## 2010-06-15 ENCOUNTER — Encounter: Payer: Self-pay | Admitting: Internal Medicine

## 2010-06-21 ENCOUNTER — Encounter (HOSPITAL_BASED_OUTPATIENT_CLINIC_OR_DEPARTMENT_OTHER): Payer: PRIVATE HEALTH INSURANCE | Admitting: Hematology & Oncology

## 2010-06-21 ENCOUNTER — Other Ambulatory Visit: Payer: Self-pay | Admitting: Family

## 2010-06-21 ENCOUNTER — Other Ambulatory Visit: Payer: Self-pay | Admitting: *Deleted

## 2010-06-21 DIAGNOSIS — C7A098 Malignant carcinoid tumors of other sites: Secondary | ICD-10-CM

## 2010-06-21 DIAGNOSIS — C787 Secondary malignant neoplasm of liver and intrahepatic bile duct: Secondary | ICD-10-CM

## 2010-06-21 LAB — CBC WITH DIFFERENTIAL (CANCER CENTER ONLY)
BASO%: 1 % (ref 0.0–2.0)
HCT: 34.5 % — ABNORMAL LOW (ref 34.8–46.6)
LYMPH%: 27.5 % (ref 14.0–48.0)
MCH: 30.5 pg (ref 26.0–34.0)
MCHC: 33.6 g/dL (ref 32.0–36.0)
MCV: 91 fL (ref 81–101)
MONO#: 0.5 10*3/uL (ref 0.1–0.9)
MONO%: 8.6 % (ref 0.0–13.0)
NEUT%: 61.4 % (ref 39.6–80.0)
Platelets: 237 10*3/uL (ref 145–400)
RDW: 12 % (ref 11.1–15.7)
WBC: 6.2 10*3/uL (ref 3.9–10.0)

## 2010-06-21 NOTE — Telephone Encounter (Signed)
Pharmacy would like for you to consider Chlorthalidone 26 mg tablets 1 po daily.  Dispense ly ly

## 2010-06-22 ENCOUNTER — Other Ambulatory Visit: Payer: Self-pay | Admitting: *Deleted

## 2010-06-22 MED ORDER — AMLODIPINE BESYLATE 10 MG PO TABS: 10.0000 mg | ORAL_TABLET | Freq: Every day | ORAL | Status: DC

## 2010-06-24 LAB — CHROMOGRANIN A: Chromogranin A: 96 ng/mL — ABNORMAL HIGH (ref 1.9–15.0)

## 2010-06-24 LAB — COMPREHENSIVE METABOLIC PANEL
Albumin: 4.7 g/dL (ref 3.5–5.2)
CO2: 25 mEq/L (ref 19–32)
Chloride: 103 mEq/L (ref 96–112)
Glucose, Bld: 105 mg/dL — ABNORMAL HIGH (ref 70–99)
Potassium: 3.6 mEq/L (ref 3.5–5.3)
Sodium: 142 mEq/L (ref 135–145)
Total Protein: 7.5 g/dL (ref 6.0–8.3)

## 2010-07-01 LAB — GLUCOSE, CAPILLARY: Glucose-Capillary: 154 mg/dL — ABNORMAL HIGH (ref 70–99)

## 2010-07-01 LAB — CROSSMATCH
ABO/RH(D): O POS
Antibody Screen: NEGATIVE

## 2010-07-01 LAB — HEMOGLOBIN AND HEMATOCRIT, BLOOD
HCT: 25.7 % — ABNORMAL LOW (ref 36.0–46.0)
Hemoglobin: 8.9 g/dL — ABNORMAL LOW (ref 12.0–15.0)

## 2010-07-01 LAB — BASIC METABOLIC PANEL
BUN: 17 mg/dL (ref 6–23)
CO2: 25 mEq/L (ref 19–32)
CO2: 25 mEq/L (ref 19–32)
Calcium: 9.2 mg/dL (ref 8.4–10.5)
Chloride: 103 mEq/L (ref 96–112)
Creatinine, Ser: 1.26 mg/dL — ABNORMAL HIGH (ref 0.4–1.2)
Creatinine, Ser: 1.36 mg/dL — ABNORMAL HIGH (ref 0.4–1.2)
GFR calc Af Amer: 52 mL/min — ABNORMAL LOW (ref >60)
GFR calc Af Amer: 58 mL/min — ABNORMAL LOW (ref >60)
GFR calc non Af Amer: 39 mL/min — ABNORMAL LOW (ref >60)
GFR calc non Af Amer: 43 mL/min — ABNORMAL LOW (ref >60)
Glucose, Bld: 114 mg/dL — ABNORMAL HIGH (ref 70–99)
Potassium: 3.8 mEq/L (ref 3.5–5.1)
Sodium: 137 mEq/L (ref 135–145)
Sodium: 141 mEq/L (ref 135–145)

## 2010-07-01 LAB — DIFFERENTIAL
Basophils Absolute: 0 10*3/uL (ref 0.0–0.1)
Lymphocytes Relative: 17 % (ref 12–46)
Lymphs Abs: 0.9 10*3/uL (ref 0.7–4.0)
Neutro Abs: 3.7 10*3/uL (ref 1.7–7.7)

## 2010-07-01 LAB — PROTIME-INR
INR: 1.1 (ref 0.00–1.49)
Prothrombin Time: 14.2 seconds (ref 11.6–15.2)

## 2010-07-01 LAB — CBC
HCT: 24.3 % — ABNORMAL LOW (ref 36.0–46.0)
Hemoglobin: 13 g/dL (ref 12.0–15.0)
Hemoglobin: 8 g/dL — ABNORMAL LOW (ref 12.0–15.0)
Hemoglobin: 8.3 g/dL — ABNORMAL LOW (ref 12.0–15.0)
Hemoglobin: 9.7 g/dL — ABNORMAL LOW (ref 12.0–15.0)
MCHC: 34.6 g/dL (ref 30.0–36.0)
MCHC: 35.1 g/dL (ref 30.0–36.0)
MCV: 91.9 fL (ref 78.0–100.0)
MCV: 92.7 fL (ref 78.0–100.0)
Platelets: 148 10*3/uL — ABNORMAL LOW (ref 150–400)
Platelets: 155 10*3/uL (ref 150–400)
Platelets: 198 10*3/uL (ref 150–400)
Platelets: 217 10*3/uL (ref 150–400)
RBC: 2.62 MIL/uL — ABNORMAL LOW (ref 3.87–5.11)
RDW: 12.7 % (ref 11.5–15.5)
RDW: 12.8 % (ref 11.5–15.5)
RDW: 12.9 % (ref 11.5–15.5)
RDW: 13.1 % (ref 11.5–15.5)
WBC: 5.2 10*3/uL (ref 4.0–10.5)
WBC: 5.2 10*3/uL (ref 4.0–10.5)
WBC: 5.2 10*3/uL (ref 4.0–10.5)

## 2010-07-01 LAB — COMPREHENSIVE METABOLIC PANEL
ALT: 26 U/L (ref 0–35)
AST: 31 U/L (ref 0–37)
CO2: 26 mEq/L (ref 19–32)
Calcium: 9.3 mg/dL (ref 8.4–10.5)
Creatinine, Ser: 1.08 mg/dL (ref 0.4–1.2)
GFR calc Af Amer: >60 mL/min (ref >60)
GFR calc non Af Amer: 51 mL/min — ABNORMAL LOW (ref >60)
Sodium: 142 mEq/L (ref 135–145)
Total Protein: 5.8 g/dL — ABNORMAL LOW (ref 6.0–8.3)

## 2010-07-01 LAB — PREPARE RBC (CROSSMATCH)

## 2010-07-01 LAB — ABO/RH: ABO/RH(D): O POS

## 2010-07-21 ENCOUNTER — Other Ambulatory Visit: Payer: Self-pay | Admitting: Internal Medicine

## 2010-07-26 ENCOUNTER — Encounter (HOSPITAL_BASED_OUTPATIENT_CLINIC_OR_DEPARTMENT_OTHER): Payer: PRIVATE HEALTH INSURANCE | Admitting: Hematology & Oncology

## 2010-07-26 DIAGNOSIS — C7A098 Malignant carcinoid tumors of other sites: Secondary | ICD-10-CM

## 2010-07-26 DIAGNOSIS — C787 Secondary malignant neoplasm of liver and intrahepatic bile duct: Secondary | ICD-10-CM

## 2010-08-03 NOTE — Discharge Summary (Signed)
[name], [name]             ACCOUNT NO.:  [id number]   MEDICAL RECORD NO.:  [id number]          PATIENT TYPE:  INP   LOCATION:  5149                         FACILITY:  [facility]   PHYSICIAN:  [name]. [name], M.D.DATE OF BIRTH:  1945-10-18   DATE OF ADMISSION:  05/28/2008  DATE OF DISCHARGE:  06/02/2008                               DISCHARGE SUMMARY   DISCHARGE DIAGNOSES:  1. Cecal carcinoid tumor.  2. Liver metastases.  3. Status post right colectomy and liver biopsy.   HISTORY OF PRESENT ILLNESS:  [name] is a 65 year old African  American female who presented for elective right colectomy and liver  biopsy.  She has a cecal carcinoid as well as multiple liver lesions  consistent with metastases.   HOSPITAL COURSE:  The patient underwent an uncomplicated right colectomy  as well as liver biopsy.  Postoperatively, she remained afebrile and  hemodynamically stable.  She was on Entereg protocol.  She did have  usual postoperative ileus and she did fine for the first 2 days  postoperatively.  Then, on May 31, 2008, she developed some bloody  stools.  Her hemoglobin dropped into the 8 range.  Her Lovenox was  stopped and she was typed and crossed but, however, she did not need any  transfusions.  The lowest hemoglobin was 8.0.  Follow up on June 01, 2008, was 8.3 and she had a normal formed brown stool and subsequently  the following day, which is June 02, 2008, hemoglobin is 8.9.  She had  a normal stool last night with no further bleeding.  She remained  hemodynamically stable throughout.  Her hypertension was controlled on  her home medications and she was discharged home on postoperative day #5  in stable condition.   DISCHARGE DIET:  Regular.   DISCHARGE ACTIVITY:  No lifting.   DISCHARGE MEDICATIONS:  Percocet 5/325 1-2 every 6 hours as needed for  pain.  In addition, she is to continue her other home medications  including:  1. Klor-Con 20 mEq p.o.  daily.  2. Hydrochlorothiazide 25 mg p.o. daily.  3. Amlodipine 10 mg p.o. daily.  4. Lisinopril 40 mg daily.  5. Simvastatin 40 mg p.o. daily.  6. Clonidine 0.2 mg p.o. b.i.d.   Followup is with myself in 1 week for staple removal.      [name]. [name], M.D.  Electronically Signed     BET/MEDQ  D:  06/02/2008  T:  06/03/2008  Job:  [id number]   cc:   Joaquin Courts, MD  Anselmo Rod, M.D.

## 2010-08-03 NOTE — Op Note (Signed)
[name], [name]             ACCOUNT NO.:  [id number]   MEDICAL RECORD NO.:  [id number]          PATIENT TYPE:  INP   LOCATION:  5149                         FACILITY:  [facility]   PHYSICIAN:  [name]. [name], M.D.DATE OF BIRTH:  [birth year]   DATE OF PROCEDURE:  05/28/2008  DATE OF DISCHARGE:                               OPERATIVE REPORT   PREOPERATIVE DIAGNOSES:  1. Cecal carcinoid tumor.  2. Multiple liver masses.   POSTOPERATIVE DIAGNOSES:  1. Cecal carcinoid tumor.  2. Multiple liver masses.   PROCEDURES:  1. Right colectomy.  2. Liver biopsy.   SURGEON:  [name]. [name], MD.   ASSISTANT:  Bertram Savin, MD.   ANESTHESIA:  General endotracheal.   HISTORY OF PRESENT ILLNESS:  Ms. Boehning is a 65 year old African  American female who was found on colonoscopy to have a cecal mass by Dr.  Charna Elizabeth.  Biopsy demonstrated carcinoid tumor.  She underwent CT  scan of the abdomen and pelvis which showed wall thickening in the  medial aspect of the cecum consistent with location of the carcinoid, it  also showed hepatomegaly with diffuse hypervascular liver masses  consistent with metastasis.  She presents today for right colectomy and  liver biopsy.  She has seen Oncology last week and she is on enteric  protocol.   PROCEDURE IN DETAIL:  Informed consent was obtained.  The patient was  identified in the preop holding area and her site was marked.  She  received intravenous antibiotics.  She was brought to the operating  room, general endotracheal anesthesia was administered by the Anesthesia  staff.  A Foley catheter was placed by the nursing staff.  Her abdomen  was prepped and draped in the sterile fashion.  A midline incision was  made.  Subcutaneous tissues were dissected down revealing the anterior  fascia.  This was divided sharply along the linea alba, and the  peritoneal cavity was entered under direct vision without difficulty.  The fascia was opened to  the length of the incision.  Exploration  revealed the liver to be filled with multiple masses.  There was one  easily packed visible and accessible medial right liver lobe and a wedge  was taken out of this and sent for pathology.  Cautery was used to get  excellent hemostasis.  Attention was then directed to the right colon,  it was found on palpation to have a very firm mass in the cecum.  The  right colon was freed up from its lateral peritoneal attachments.  Using  Bovie cautery and LigaSure, this brought the colon up into the wound.  This mobilization was continued around the hepatic flexure, and we got  up to the mid transverse colon.  The omentum at this point was divided.  The colon was then divided around the mid transverse colon with GIA 75  stapler.  The mesentery was taken down with Bovie cautery, Kelly clamps,  and suture ligated as needed.  We continued this down and then the  terminal ileum was divided with a GIA 75 stapler and the remaining  anterior bleeding mesentery  was taken down with the LigaSure, achieving  excellent hemostasis.  This specimen was passed off.  The right gutter  was checked and there was no bleeding.  The colon was then anastomosed  to the ileum with a side-to-side anastomosis with GIA 75 stapler.  The  resultant enterotomy was closed with TA 60.  There was nice widely  patent anastomosis.  A couple of stitches were placed along the staple  line to get excellent hemostasis.  Staple lines were intact and there  was no leakage, some air forward and backward.  The crotch stitch of 2-0  silk was placed as well.  We changed our gloves then the rents in the  mesentery was closed with interrupted figure-of-eight 2-0 silk sutures.  The anastomosis remained viable and patent.  There was no bleeding.  The  abdomen was copiously irrigated with several liters of warm saline.  Anastomosis was again checked and was intact and viable.  The intestines  were returned  to the anatomical position.  Irrigation fluid was  evacuated and it was clear.  The fascia was then closed with #1 looped  PDS one from end of the incision and tied to the middle  The sutures  were tucked.  Subcutaneous tissues were irrigated, and the skin was  closed with staples.  Sponge, needles, and instrument counts were  correct.  The patient tolerated the procedure well without apparent  complication.  A sterile dressing was placed.  She was taken to the  recovery room in stable condition.      [name] [name], M.D.  Electronically Signed     BET/MEDQ  D:  05/28/2008  T:  05/29/2008  Job:  [id number]   cc:   Anselmo Rod, M.D.  Joaquin Courts, MD

## 2010-08-06 NOTE — H&P (Signed)
NAMERHEANNA, [name]                         ACCOUNT NO.:  [id number]   MEDICAL RECORD NO.:  [id number]                   PATIENT TYPE:  INP   LOCATION:  2104                                 FACILITY:  [facility]   PHYSICIAN:  Leighton Roach McDiarmid, M.D.             DATE OF BIRTH:  1945-07-13   DATE OF ADMISSION:  05/11/2003  DATE OF DISCHARGE:                                HISTORY & PHYSICAL   HISTORY OF PRESENT ILLNESS:  This is a 65 year old African-American female  who presents with the sudden onset of right-sided weakness and dysarthria,  which she noted upon waking up this morning.  No other associated symptoms.   PAST HISTORY:  1. Hypertension.  2. Previous hospitalization in November of 2004 for seizures secondary to     cocaine abuse, and hypertensive urgency.   MEDICATIONS:  1. Clonidine 0.1 mg b.i.d.  2. Iron supplements.   ALLERGIES:  No known drug allergies.   FAMILY HISTORY:  No history of diabetes, cerebrovascular accident,  hypertension, or cardiac disease.   PERSONAL AND SOCIAL HISTORY:  Lives with boyfriend.  Has 3 grown children  who do not live with her.  Has a history of cocaine abuse.  No smoking, no  alcohol, no IV drug use.   REVIEW OF SYSTEMS:  No nausea, vomiting, fever, or history of trauma.  No  shortness of breath, chest pain, dyspnea, dysphagia, cough, or palpitations.   PHYSICAL EXAMINATION ON ADMISSION:  VITAL SIGNS:  Blood pressure 191/98,  becoming 187/83, pulse rate of 59, respiratory rate 20, temperature 98.7, O2  saturations 98% on room air.  GENERAL:  Awake, alert, reactive x2 with slurred speech, not in distress.  HEENT:  Normocephalic and atraumatic.  Pupils equal, round and reactive to  light bilaterally.  Tympanic membranes with normal landmarks on the right.  Tympanic membranes obscured on the left.  No posterior pharyngeal  congestion.  NECK:  No thyromegaly.  No carotid bruits.  CVS:  Dynamic precordium.  Regular rate and rhythm.   There is a grade 2/6 to  3/6 systolic murmur best heard at the right parasternal border at the third  intercostal space.  ABDOMEN:  Normoactive bowel sounds.  No hepatosplenomegaly.  No tenderness.  EXTREMITIES:  No edema, no cyanosis, no clubbing.  There were +2 pulses.  NEUROLOGIC:  Cranial nerves - shallow right nasolabial fold.  Tongue  deviated to the right.  Uvula deviated to the left.  The rest of the cranial  nerves intact.  Motor - there was 2/5 on the right upper extremity, 3/5 to  5/5 on the right lower extremity, 5/5 on the left upper extremity and the  left lower extremity.  Sensory intact with __________ +2, symmetric.  No  Babinski or clonus.  Cerebellar good finger-to-nose test using the left  upper extremity.   LABORATORY DATA:  On admission, hemoglobin 11, hematocrit 32.8, WBC's 5.4,  platelets 169.  Sodium  139, potassium 3.2, chloride 102, CO2 30, BUN 9,  creatinine 1.1, calcium 9.2, albumin 3.7.  Total protein 6.7.  AST 24, ALT  18, total bilirubin 1.  Alkaline phosphatase 59, blood pressure 12.9, INR  1.0, PTT 25.  EKG with normal sinus rhythm, right bundle branch block.  Left  ventricular hypertrophy with QRS widening, ST depression, and inverted T  waves on V3 to V6.  CT of the head without contrast showed a 5.0 x 2.5 x 2.5  cm left basal ganglia hemorrhage, 3-4 mm MLS on the right.   ASSESSMENT AND PLAN:  A 65 year old African-American female presenting with  right-sided weakness and dysarthria.  1. Cerebrovascular accident, hemorrhagic.  Monitor vital signs and     neurologic examination every 2 hours.  We will watch out for signs of     increasing intracranial pressure.  Hold aspirin and anti-platelet     treatment.  We will check a fasting lipid panel.  Stroke Team consult for     further evaluation and management.  2. Hypertension.  Hold clonidine.  Will give nicardipine IV drip to maintain     blood pressure between 170 to 190 systolic.  We will avoid  aggressive     blood pressure control for fear of extension of the ischemia of the     tissue surrounding the hemorrhagic area.  3. Rule out myocardial infarction.  The patient did not complain of chest     pain on admission; however, EKG showed new findings of ST depression and     T wave inversion on the lateral leads.  Will cycle cardiac enzymes, and     repeat EKG after 8 hours.  4. Fluids, electrolytes, and nutrition.  NPO temporarily.  IVF normal saline     at 125 cc per hour.  Will supplement potassium.      Jillian Sabal, MD                         [name], M.D.    MC/MEDQ  D:  05/11/2003  T:  05/11/2003  Job:  [id number]   cc:   [facility]

## 2010-08-06 NOTE — Consult Note (Signed)
[name], [name]                         ACCOUNT NO.:  [id number]   MEDICAL RECORD NO.:  [id number]                   PATIENT TYPE:  INP   LOCATION:  2104                                 FACILITY:  [facility]   PHYSICIAN:  Deanna Artis. Sharene Skeans, M.D.           DATE OF BIRTH:  03/28/1945   DATE OF CONSULTATION:  05/11/2003  DATE OF DISCHARGE:                                   CONSULTATION   CHIEF COMPLAINT:  Right-sided weakness.   HISTORY OF THE PRESENT CONDITION:  The patient is a 65 year old right-handed  African-American woman with longstanding history of hypertension,  noncompliance with medical regimen, and cocaine abuse with prior lacunar  infarctions who presented with a right-sided weakness that began around 8  p.m. on Saturday evening.  The patient presented to the emergency room this  morning and was noted to have a 5 x 2.5 x 2.5 cm primary hemorrhage in the  left putamen.  There was moderate effacement of the left lateral ventricle  and of the thalamus.  Family Practice Teaching Service follows the patient  and admitted her.  I was asked to see her in consultation to determine the  etiology of her dysfunctions and to make recommendations for further workup  and treatment.   PAST MEDICAL HISTORY:  Remarkable for hypertension.  She was admitted  November 2004 for a seizure secondary to cocaine abuse.  The patient is  unable to tell me of any other significant medical problems.   PAST SURGICAL HISTORY:  None known.   MEDICATIONS:  Clonidine 0.1 mg b.i.d.   DRUG ALLERGIES:  None known.   FAMILY HISTORY:  The patient's mother died in her 60s of old age.  Father  died in his 45s of unknown causes.  There is no known history of coronary  artery disease, hypertension, diabetes mellitus.   SOCIAL HISTORY:  The patient lives with her boyfriend.  She has 3 children  who do not live with her.  She denied use of smoking, alcohol.  She has used  cocaine in the past.   REVIEW OF  SYSTEMS:  The patient has had no trauma.  No signs of infection,  no fever, normal sleep patterns.  She has had prior incontinence of bowel  and bladder but this is not a regular thing.  She has also had headache but  she does not complain of headache at this time.  A 12-system review is  otherwise negative.   PHYSICAL EXAMINATION:  GENERAL:  On examination today, a pleasant woman  lying in bed in no distress.  VITAL SIGNS:  Blood pressure now 178/69 after Cardene.  The patient has had  blood pressures as high as 210/100.  Resting pulse 68, respirations 19.  Pulse oximetry 100.  EAR, NOSE, AND THROAT:  No bruits.  LUNGS:  Clear.  HEART:  No murmur.  Pulses normal.  ABDOMEN:  Soft, bowel sounds normal.  EXTREMITIES:  Negative.  NEUROLOGICAL:  Mental Status:  The patient was awake, alert, names objects,  follows commands.  Cranial Nerves:  Round reactive pupils. Visual fields  full.  Fundi normal, right central seventh.  Tongue and uvula midline.  Motor Examination:  Right drift, normal strength except the wrist extensor  and flexor which is 4, grip 4, fine motor movements poor quality.  Hip  flexors 4/5, rest is 5/5.  Left side is normal.  Sensory exam:  Astereognosis on the right.  Primary sensation is okay.  Cerebellar:  Clumsy  on the right, normal on the left.  Deep tendon reflexes:  Right upper  extremity reflex predominance, left knee and patellae are equal, ankles are  absent.  The patient had bilateral flexor plantar responses.   IMPRESSION:  1. 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  2. Prior lacunar disease right external capsule of several small lesions     involving the external capsule and caudal putamen, left thalamus, and     left putamen noted on the February 08, 2003 CT scan.  3. Uncontrolled hypertension.  4. Dysarthria, dysphagia, and mild right  hemiparesis.  5. Cocaine-induced seizure.   PLAN:  Keep the blood pressure 180 plus or minus 10 systolic, 90 plus or  minus 10 diastolic.  No antiplatelet agents.  Dysphagia II diet.  The  patient does not need an extensive workup for stroke.  We probably should  look at an echocardiogram and carotid Doppler and look for other risk  factors for stroke.  I appreciate the opportunity to participate in her  care.  If you have questions, do not hesitate to contact me.                                               Deanna Artis. Sharene Skeans, M.D.    [facility]  D:  05/11/2003  T:  05/11/2003  Job:  [id number]   cc:   Leighton Roach McDiarmid, M.D.  Fax: [phone]

## 2010-08-06 NOTE — Op Note (Signed)
NAMESTEPHANIEANN, [name]             ACCOUNT NO.:  [id number]   MEDICAL RECORD NO.:  [id number]          PATIENT TYPE:  AMB   LOCATION:  DSC                          FACILITY:  [facility]   PHYSICIAN:  Salley Scarlet., M.D.DATE OF BIRTH:  1945-08-23   DATE OF PROCEDURE:  DATE OF DISCHARGE:  10/03/2005                                 OPERATIVE REPORT   PREOPERATIVE DIAGNOSIS:  Chalazion, upper and lower lid, left eye.   POSTOPERATIVE DIAGNOSIS:  Chalazion, upper and lower lid, left eye.   PROCEDURE:  Chalazion excision, upper and lower lid, left eye.   ANESTHESIA:  Local with Xylocaine one-percent.   PROCEDURE:  Upper and lower lid of the left eye were inspected.  There was a  large lesion located along the medial aspect of the upper lid of the left  eye and a lesion about the same size located about the central aspect of the  lower lid of the left eye.  The patient was prepped and draped in the usual  manner.  The upper and lower lids were then infiltrated with several cubic  centimeters of Xylocaine.  Chalazion clamp was applied over the lesion on  the lower lid.  The lid was everted and a cruciate incision was made in the  tarsal lesion.  The lesion was curetted using the chalazion curette.  The  sac was incised using sharp and blunt dissection.  A second clamp was then  applied over the lesion of the upper lid.  The lid was everted, a cruciate  incision made in the tarsal of the lesion.  The lesion was curetted using  the chalazion curette.  The sac was incised using sharp and blunt  dissection.  Polysporin ointment and a pressure patch was applied.  The  patient tolerated the procedure well.  She was discharged to the post-  anesthesia recovery room in satisfactory condition.  She has been instructed  to take Vicodin every four hours as needed for pain, to remove the patch  tomorrow, to resume her drops four times a day and we will see her in the  office in one week.   DISCHARGE DIAGNOSIS:  Chalazion, upper and lower lid, left eye.      Salley Scarlet., M.D.  Electronically Signed     TB/MEDQ  D:  10/05/2005  T:  10/05/2005  Job:  [id number]

## 2010-08-06 NOTE — Assessment & Plan Note (Signed)
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 She was discharged to home with her  boyfriend. She has completed her home health therapies. She really denies  any significant problems at this point. She still complains of dragging her  right leg with walking. She is not using adaptive equipment to walk,  however. She has interest in trying a cane. She does complain of decreased  shoulder movement and occasional pain in the right shoulder. The patient  denies problems with seizures, new weakness, numbness, dizziness, spasm or  vertigo. She has had no problems with vision, confusion, sleep, mood,  swallowing, bowel or bladder function. She denies headaches.   REVIEW OF SYSTEMS:  A 14 point health and history checklist was completed.  Details are on this form.   PHYSICAL EXAMINATION:  VITAL SIGNS:  Blood pressure is 155/57, pulse 64,  respiratory rate 20. She is sating 100% on room air.  NEUROLOGIC:  The patient walks with a gait, favoring the right side and  tends to shuffle the right foot. Affect is appropriate and she is well kept.  Cranial nerve examination showed some mild central 7 signs on the right.  Visual fields were intact. Cognition was appropriate. She had occasional  word finding deficits. Right upper extremity strength has improved  dramatically. She is 3+ to 4 out of 5 proximally and 3 out of 5 distally.  Right lower extremity is 4 out of 5 throughout. She still has some decreased  fine motor movement of the right upper and lower extremities. Reflexes are  slightly increased on the right side. Sensory function appeared to be  grossly intact today on the right. Left sided neurological examination was  normal. The patient had decreased range of motion and the humerus locked  with the scapula at about 45 degrees of abduction. She is limited to passive  movement to about 60 degrees of abduction today. There was some  pain with  this as well as with passive internal and external rotation.  HEART:  Regular rate and rhythm.  LUNGS:  Clear.  SKIN:  Intact. She had no clubbing, cyanosis, or edema.   ASSESSMENT:  1. Status post left basal ganglia hemorrhage.  2. History of poly-substance abuse.  3. Hypertension.  4. Right adhesive capsulitis.   PLAN:  1. The patient is making great gains at this point. I would like to send her     to outpatient therapy to further her gait. She should probably be walking     with a straight cane at this point. I would also like for therapy to     address right sided shoulder issues.  2. We discussed poly-substance abuse and her need to stay clean at this     point and the patient agrees with that plan.  3. The patient will followup with [facility] regarding hypertension, which     is fairly controlled at this point.  4. Will see the patient back in 2 months time.      Ranelle Oyster, M.D.   ZTS/MedQ  D:  10/03/2003 11:07:04  T:  10/03/2003 11:51:20  Job #:  [id number]

## 2010-08-06 NOTE — Discharge Summary (Signed)
NAMEJAKI, [name]                         ACCOUNT NO.:  [id number]   MEDICAL RECORD NO.:  [id number]                   PATIENT TYPE:  IPS   LOCATION:  4144                                 FACILITY:  [facility]   PHYSICIAN:  Ranelle Oyster, M.D.             DATE OF BIRTH:  1945/10/03   DATE OF ADMISSION:  05/20/2003  DATE OF DISCHARGE:  06/05/2003                                 DISCHARGE SUMMARY   DISCHARGE DIAGNOSES:  1. Left basal ganglia hemorrhage.  2. Hypertension.  3. History of polysubstance abuse.   HISTORY OF PRESENT ILLNESS:  [name] is a 65 year old female  with a history of hypertension and cocaine-induced seizures who was admitted  to 20 with right sided weakness.  UA positive for cocaine.  CT done showed 5  x 2.5 x 2.5-cm left basal ganglia hemorrhage compatible with a hypertensive  bleed.  Carotid duplex done showed no RCA stenosis.  Cardiac echo showed an  EF of 55 to 65%, no ventricular wall abnormality.  Neurology was consulted  for input and recommends no antiplatelets.  No signs of dysphagia, reported  the patient on regular diet,  __________  BP on a monitor, then she was  better controlled.  The patient continued with mild right hemiparesis.  PT,  speech therapy initiated and the patient is at close supervision for  transfers, moderate to maximum assist ambulating 30 feet with quad cane,  maximum assist for sequencing and right lower extremity advancement.  Speech  is 80% intelligible with problems with organization and reasoning.   PAST MEDICAL HISTORY:  1. Hypertension.  2. Anemia.  3. Cocaine-induced seizures in November 2004.   ALLERGIES:  No known drug allergies.   SOCIAL HISTORY:  The patient lives with sister and boyfriend and was  independent prior to admission.  She denies alcohol, tobacco or cocaine use.   HOSPITAL COURSE:  [name] was admitted to rehabilitation on  May 20, 2003 for inpatient therapies to consist of  PT, OT daily.  After  admission the patient's blood pressures were monitored on a b.i.d. basis and  she has shown borderline control.  Lisinopril was increased to b.i.d. with  better control.  Speech therapy has been following her for dysarthria as  well as cognition.  The patient's speech is intelligible at sentence level  with mild dysarthria with variable speed of rate.  The patient's basic  comprehension and expression are intact.  She needs to slow down in her  expression to prevent dysarthria.  She is able to follow two-step commands.  High level comprehension is at 65%.  Reading was not tested.  Writing is  repressible to right paresis.  High level expression is at moderate to  maximum assist.  In terms of ADLs, the patient is at supervision for  bathing, minimum assist for dressing, needs instructional cuing for lower  body care, needs supervision for toileting.  Minimal assist with simple home  management tasks.  She continues to demonstrate a decrease in right  attention during grooming as well as decreasing awareness of her deficits.  She is at supervision for transfers, close supervision traveling 250 feet  without assistive device.  She is noted to have decreased stance time on  right lower extremity with tendency to drag her right lower extremity;  however, she is able to maintain balance independently.  Further followup  therapies to include PT/OT by advanced home care, 24-hour supervision is  recommended and the patient's family is to provide this.  On June 05, 2003  the patient is discharged to home.   DISCHARGE MEDICATIONS:  1. Lisinopril 20 mg per day.  2. Hydrochlorothiazide 25 mg a day.  3. Zocor 20 mg q.h.s.  4. Multivitamin with iron one per day.   DISCHARGE INSTRUCTIONS:  1. At 24-hour supervision for now.  2. Do not walk without supervision or assistance.   DIET:  Regular.   SPECIAL INSTRUCTIONS:  1. No alcohol.  2. No tobacco.  3. No driving.  4. No  cocaine.   FOLLOW UP:  1. The patient is to follow up with LMD at [facility] in the next two     weeks.  2. Follow up with Dr. Riley Kill on April 29.  3. Follow up with Dr. Sharene Skeans as needed.      Greg Cutter, P.A.                    Ranelle Oyster, M.D.    PP/MEDQ  D:  06/05/2003  T:  06/09/2003  Job:  [id number]   cc:   [address]   Deanna Artis. Sharene Skeans, M.D.  [address]  [address]  [place]  [place] [address]  Fax: [phone]

## 2010-08-06 NOTE — Assessment & Plan Note (Signed)
MEDICAL RECORD NUMBER:  [id number].   [name] is here regarding her left basal ganglia stroke and hypertension. She  continues to have some difficulties with her balance but nothing new is  noted. She has not had any problems with headaches, dizziness, blurred  vision, etc. She denies pain. Her leg tightness usually clears up in the  morning after she walks a bit. She has had a blood pressure checked at  [facility] but no further changes have been made since we addressed it with  increasing her lisinopril last visit.   SOCIAL HISTORY:  The patient denies any new changes.   REVIEW OF SYSTEMS:  Negative for any new items today. Full review of systems  is in the health and history section of the chart.   PHYSICAL EXAMINATION:  Blood pressure is 166/84, pulse 58, respiratory rate  16. She is saturating 100% on room air. The patient walks with some  shuffling still on the right side but is stable in her posture and balance.  Affect is bright and appropriate. She is mildly dysarthric but cognitively  intact. Cranial exam is within normal limits. Strength is 5/5 throughout.  Reflexes are 2++ on the right.  HEART:  Regular rate and rhythm.  LUNGS:  Clear.  ABDOMEN:  Soft and nontender.   ASSESSMENT:  1.  Status post left basal ganglia hemorrhage.  2.  Hypertension.  3.  History of polysubstance abuse.   PLAN:  1.  Will add Norvasc 5 mg q.d. to our regimen of lisinopril 50 mg a day and      hydrochlorothiazide 25 mg q.d. I hoped to add Tenormin, but the      patient's heart rate would not allow it. Recommend followup with      [facility] in the next one or two months to check blood pressure.  2.  I will see the back in three months' time.      ZTS/MedQ  D:  06/25/2004 11:06:43  T:  06/25/2004 15:04:38  Job #:  [id number]

## 2010-08-06 NOTE — Discharge Summary (Signed)
NAMEARLETTA, Adams                         ACCOUNT NO.:  [id number]   MEDICAL RECORD NO.:  [id number]                   PATIENT TYPE:  INP   LOCATION:  5734                                 FACILITY:  [facility]   PHYSICIAN:  Madaline Guthrie, M.D.                 DATE OF BIRTH:  03-17-1946   DATE OF ADMISSION:  02/08/2003  DATE OF DISCHARGE:  02/11/2003                                 DISCHARGE SUMMARY   DISCHARGE DIAGNOSES:  1. Seizures secondary to cocaine abuse.  2. Hypertensive urgency.  3. Cocaine use.   DISCHARGE MEDICATIONS:  Clonidine 0.1 mg p.o. b.i.d.   DISPOSITION:  Improved.  Things to be checked the next visit will be blood  pressure and seizure activity.   PROCEDURES:  1. EKG on November 20 which showed normal sinus rhythm, atrial enlargement,     right bundle branch block, left ventricular hypertrophy with QRS     widening, ventricular rate of 84 beats per minute, PR interval 138, QRS     duration 154, QTC 488.  No pathologic ST elevation or ST depression.  2. Head CT without contrast February 08, 2003, negative for bleed or other     activity in cranial process, bilateral basal ganglia lacunar infarct as     above noted in the read out of the film.   CONSULTATIONS:  None.   HISTORY:  [name] is a 65 year old African-American female with a  history of drug abuse and hypertension who is here after seizure activity  this a.m.  The patient was witnessed by her neighbors to be staring at the  sky, having shaking motions, and falling down and foaming pink stuff at the  mouth.  It was stated that this happened after the patient had taken her  blood pressure medications.  EMS was called and arrived within minutes.  EMS  said that the patient had garbled speech and blood pressure of 240/100.  While in the presence of EMS, the patient seized again and was given 5 mg  Diazepam which caused the seizure to subside.  At [facility] Emergency  Department parking lot, the  patient seized again and was given another 5 mg  of Diazepam by EMS.  While in the emergency department inside [facility], the patient was seen to have a seizure again and was given  Lorazepam 2 mg IV.  On exam, the patient had a blood pressure of 206/112.  She was given labetalol IV 20 mg for reduction of blood pressure down to  140/96.   PHYSICAL EXAMINATION:  VITAL SIGNS:  Temperature 99.2, pulse 85, blood  pressure 194/91, respiratory rate 17, O2 saturation 100% on room air.  GENERAL:  She is lying in bed originally fighting oxygen mask on her face  but occasionally resting quietly but no answering questions.  HEENT:  Pupils 4 mm constricted down to 3 mm light.  Pupils equal,  round,  and reactive to light and accommodation.  Sclerae clear.  Pupils are  symmetric.  Her tongue does show positive trauma.  NECK:  Supple.  LUNGS:  Clear to auscultation bilaterally anteriorly.  She has no crackles,  no wheezes.  She does not have labored breathing.  CARDIOVASCULAR:  Regular rate and rhythm without murmurs, rubs, or gallops.  ABDOMEN:  Positive bowel sounds.  No hepatosplenomegaly.  Soft, nontender,  nondistended.  EXTREMITIES:  No clubbing, cyanosis, or edema.  SKIN:  No lesions or laceration.  MUSCULOSKELETAL:  She is able to move all extremities.  NEUROLOGIC:  She has 2+ deep tendon reflexes x 4.  EXTREMITIES:  Normal Babinski.  PSYCHIATRIC:  Unable to perform.   ADMISSION LABORATORY DATA:  Hemoglobin 11, hematocrit 33.  Sodium 140,  potassium 3.3, chloride 105, CO2 27, BUN 13, blood sugar 137.  Initial ABG  showed pH 7.19, CO2 67, bicarb 25.   Head CT as noted above.   In the emergency department, her ABG corrected to pH 7.35, CO2 of 56, oxygen  183, and bicarb 31.   HOSPITAL COURSE:  #1.  SEIZURES:  Etiology was felt to be most likely secondary to cocaine use  given the fact that her head CT was normal, and no other cause could be  found except for positive urine  drug screen.  All of her electrolytes were  normal as well.  The altered mental status with which she presented was felt  to be secondary to postictal state because within on day, the patient began  to clear and admitted to cocaine use and also was stating she would not use  cocaine again given the fact it caused the seizure and that she did not feel  good after having seizure.  After the day of admission, no further seizure  activity was witnessed.  The patient was given Dilantin while in the  hospital; however, because the etiology was thought to be cocaine, it was  felt that the best way to prevent further seizure activity was just to stop  the cocaine use.  If, however, in the future the patient does have another  seizure, it probably would be a good idea to put the patient on chronic  Dilantin therapy.   #2.  HYPERTENSIVE URGENCY:  The patient, while she had altered mental  status, was treated with labetalol IV. As soon as patient was able to take  oral medications, she was switched back to the Clonidine which helped her  maintain her blood pressure.  Throughout the rest of the hospital course,  the patient's blood pressure was improved within the range of 210 to 128/40s  to 70s.  It is thought that, since she does get her care at [facility], I  will defer to them for chronic hypertension management.  It is a question of  if the patient would benefit from b.i.d. dose of clonidine instead of once a  day dosing and would recommend increasing the number of times she takes  medicine daily if she continues to have elevated blood pressure.   #3.  COCAINE USE:  The patient was offered counseling on drug cessation.   #4.  ANEMIA:  The patient did have a normocytic anemia most likely secondary  to iron-deficiency.  Her occult blood test was negative.  Because she was  asymptomatic, will follow for now but in future would recommend possible  iron supplementation for this.   DISCHARGE  LABORATORY DATA:  The patient was  discharged on November 23.  Laboratories included an ABG which showed a pH of 7.42, CO2 41, oxygen 176,  bicarb 25, oxygen saturation 99.8%.  White blood cell count 6, hemoglobin  10, hematocrit 30, platelet count 138.   FOLLOW UP:  The patient is scheduled to see [facility] in two  to three weeks post discharge to follow up for her chronic problems there.      Foye Clock, MD                         Madaline Guthrie, M.D.    JH/MEDQ  D:  03/11/2003  T:  03/12/2003  Job:  [id number]   cc:   [facility]

## 2010-08-06 NOTE — Assessment & Plan Note (Signed)
DATE OF VISIT:  May 26, 2004.   MEDICAL RECORD NUMBER:  [id number].   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  She  is still having some mild problems with coordination along the right foot,  but she is walking without a device currently.  She denies any headaches and  problems with strength in general or sensory function.  She has had no pain.  She had some right shoulder tightness initially, but that has cleared.  She  spends a lot of her time with her boyfriend.  She is actually living with  him now.  She has had her blood pressure checked by [facility].  The last  time she saw them was about one month ago.   SOCIAL HISTORY:  The patient states that she remains off of drugs.  She does  have some stress from her boyfriend and their day to day interactions, but  otherwise is doing well.   REVIEW OF SYSTEMS:  The patient reports periodic agitation.  She denies any  headaches.  No weakness or numbness.  She does have some tightness in the  right leg once she stands up from a seated position.  Full review of systems  is in the health and history section on the chart.   PHYSICAL EXAMINATION:  The blood pressure is 190/84, the pulse is 60 and the  respiratory rate is 20.  She is saturating 96% on room air.  The patient  walks with a shuffling gait to the right side.  She has some minor problems  advancing the right foot in a fluid fashion.  Affect is bright and  appropriate.  Appearance is fairly well kept.  Cognitively she is  appropriate.  No focal cranial nerve abnormalities were noted.  She had  minimal dysarthria.  She seemed to find words very well today.  Strength is  really at 5/5 at both extremities.  Sensory exam was grossly intact.  Reflexes were still increased at 2+ on the right side.  She had good  shoulder range of motion and really range of motion throughout the right and  lower extremities.  The heart was regular rate and rhythm.  The lungs  were  clear.  The abdomen was soft and nontender.  The skin was intact.   ASSESSMENT:  1.  Status post left basal ganglia hemorrhage.  2.  History of polysubstance abuse.  3.  Hypertension.   PLAN:  1.  The patient is doing well from a physical standpoint.  I want to see her      continue walking and ambulating as tolerated.  2.  I am concerned about her blood pressure and risk for restroke.  Will      increase lisinopril to 40 mg daily.  Will have her come back in one      month for followup.  May need to consider another agent for blood      pressure control.      ZTS/MedQ  D:  05/26/2004 12:35:04  T:  05/26/2004 14:02:59  Job #:  [id number]

## 2010-08-06 NOTE — Assessment & Plan Note (Signed)
HISTORY OF PRESENT ILLNESS:  Ms. Sultan is here in followup of her left  basal ganglia stroke. We discussed her blood pressure last visit as well as  her continued rehabilitation progress. She was having some significant  problems with her right shoulder, which included discomfort and decreased  range of motion. She has continued to work with therapy on these upper  extremity issues as well as her gait. She has exceeded her functional goals.  She is independent now at the household level without any problems. She does  have some right shoulder tightness but the functional range of motion of the  shoulder has improved. The patient is to stay clean and clear from drugs.  She spends some time with her boyfriend, who has also been clean. The  patient denies pain today.   REVIEW OF SYSTEMS:  The patient denies chest pain, shortness of breath,  cold, flu, wheezing or coughing symptoms. The patient denies seizures,  weakness, numbness, dizziness, spasms, stroke, problems with confusion,  anxiety, agitation or headaches. She denies nausea, vomiting, reflux,  diarrhea, constipation, or bladder and bowel incontinence. The patient  denies fever, chills, skin breakdown, sweating or problems with sugars.   PHYSICAL EXAMINATION:  GENERAL:  On examination today, the patient is  pleasant and in no acute distress.  VITAL SIGNS:  Blood pressure 163/62, pulse 58, respiratory rate 18. She is  sating 100% on room air.  NEUROLOGIC:  The patient walks with a slightly shuffling gait to the right  side but is generally stable. She improves the further she walks. We tried  some heel to toe ambulation today and the patient lost her balance slightly.  The patient was able to stand on the left foot for 5 seconds and then the  right foot for 3 seconds before starting to lose her balance. Her affect is  bright and appropriate and appearance is well kept. On neurological  examination, cranial nerve examination  showed a mild right central 7. She  had some mild dysarthria as well. Cognition was appropriate. Word finding  deficits were minimal today. Strength in the right upper extremity is  improved to 4 out of 5 throughout proximally and even down to distally,  which may have been 4 to 4 minus out of 5. Right lower extremity is 5 out of  5. She had some fine motor coordination deficits in the right side but  overall, did well in the sitting position with using the right extremities.  Reflexes were slightly hyperactive on the right side at 2 plus/plus. Sensory  function is grossly intact. On range of motion examination at the right  shoulder, the patient had limited abduction without scapular rotation. She  was able to lift the arm to approximately 50 degrees before meeting bony  lock. With scapular movement, the patient was able to lift the arm over her  head. Passive movement was 5 to 10 degrees beyond this active movement  today. The patient had minimal pain with these passive maneuvers today.  HEART:  Regular rate and rhythm.  LUNGS:  Clear to auscultation.  ABDOMEN:  Soft and nontender. Bowel sounds were positive.  SKIN:  Intact without clubbing, cyanosis, or edema.   ASSESSMENT:  1.  Status post left basal ganglia hemorrhage.  2.  History of poly-substance abuse.  3.  Hypertension.  4.  Right sided adhesive capsulitis, which is slightly improved.   PLAN:  1.  The patient will complete her therapy and followup with a home exercise  program.  2.  The patient will maintain on current blood pressure medications and      followup with [facility] for her blood pressure needs.  3.  We discussed vocational rehabilitation but the patient does not have a      desire to return to work at this point.  4.  Will see the patient back in about 6 months' time.      Ranelle Oyster, M.D.   ZTS/MedQ  D:  12/02/2003 13:16:53  T:  12/02/2003 21:12:58  Job #:  [id number]

## 2010-08-23 ENCOUNTER — Other Ambulatory Visit: Payer: Self-pay | Admitting: Hematology & Oncology

## 2010-08-23 ENCOUNTER — Encounter (HOSPITAL_BASED_OUTPATIENT_CLINIC_OR_DEPARTMENT_OTHER): Payer: PRIVATE HEALTH INSURANCE | Admitting: Hematology & Oncology

## 2010-08-23 DIAGNOSIS — C7A098 Malignant carcinoid tumors of other sites: Secondary | ICD-10-CM

## 2010-08-23 DIAGNOSIS — Z5111 Encounter for antineoplastic chemotherapy: Secondary | ICD-10-CM

## 2010-08-23 DIAGNOSIS — C787 Secondary malignant neoplasm of liver and intrahepatic bile duct: Secondary | ICD-10-CM

## 2010-08-23 DIAGNOSIS — C179 Malignant neoplasm of small intestine, unspecified: Secondary | ICD-10-CM

## 2010-08-23 LAB — CBC WITH DIFFERENTIAL (CANCER CENTER ONLY)
BASO#: 0 10*3/uL (ref 0.0–0.2)
Eosinophils Absolute: 0.1 10*3/uL (ref 0.0–0.5)
HCT: 35.8 % (ref 34.8–46.6)
HGB: 12.1 g/dL (ref 11.6–15.9)
MCH: 30.6 pg (ref 26.0–34.0)
MCV: 91 fL (ref 81–101)
MONO%: 8.8 % (ref 0.0–13.0)
NEUT#: 3.4 10*3/uL (ref 1.5–6.5)
NEUT%: 60.9 % (ref 39.6–80.0)
RBC: 3.95 10*6/uL (ref 3.70–5.32)

## 2010-08-26 LAB — COMPREHENSIVE METABOLIC PANEL
Albumin: 4.8 g/dL (ref 3.5–5.2)
Alkaline Phosphatase: 58 U/L (ref 39–117)
BUN: 18 mg/dL (ref 6–23)
Calcium: 11 mg/dL — ABNORMAL HIGH (ref 8.4–10.5)
Glucose, Bld: 96 mg/dL (ref 70–99)
Potassium: 3.5 mEq/L (ref 3.5–5.3)

## 2010-08-26 LAB — CHROMOGRANIN A: Chromogranin A: 78 ng/mL — ABNORMAL HIGH (ref 1.9–15.0)

## 2010-09-20 ENCOUNTER — Encounter (HOSPITAL_BASED_OUTPATIENT_CLINIC_OR_DEPARTMENT_OTHER): Payer: PRIVATE HEALTH INSURANCE | Admitting: Hematology & Oncology

## 2010-09-20 ENCOUNTER — Other Ambulatory Visit: Payer: Self-pay | Admitting: Family

## 2010-09-20 DIAGNOSIS — Z5111 Encounter for antineoplastic chemotherapy: Secondary | ICD-10-CM

## 2010-09-20 DIAGNOSIS — C179 Malignant neoplasm of small intestine, unspecified: Secondary | ICD-10-CM

## 2010-09-23 LAB — CHROMOGRANIN A

## 2010-10-05 ENCOUNTER — Other Ambulatory Visit: Payer: Self-pay | Admitting: *Deleted

## 2010-10-05 MED ORDER — CLONIDINE HCL 0.2 MG PO TABS: 0.2000 mg | ORAL_TABLET | Freq: Three times a day (TID) | ORAL | Status: DC

## 2010-10-05 NOTE — Telephone Encounter (Signed)
appt set for aug 2 at [facility]

## 2010-10-05 NOTE — Telephone Encounter (Signed)
I cannot tell when her last appoint was in clinic. I will refill for 1 month. She needs evaluation if we ae going to continue her prescribing- she does see Dr. Myna Hidalgo regularly for lab work and oncology follow.

## 2010-10-08 ENCOUNTER — Other Ambulatory Visit: Payer: Self-pay | Admitting: *Deleted

## 2010-10-10 MED ORDER — HYDROCHLOROTHIAZIDE 25 MG PO TABS: 25.0000 mg | ORAL_TABLET | Freq: Every day | ORAL | Status: DC

## 2010-10-18 ENCOUNTER — Other Ambulatory Visit: Payer: Self-pay | Admitting: Family

## 2010-10-18 ENCOUNTER — Encounter (HOSPITAL_BASED_OUTPATIENT_CLINIC_OR_DEPARTMENT_OTHER): Payer: PRIVATE HEALTH INSURANCE | Admitting: Hematology & Oncology

## 2010-10-18 DIAGNOSIS — C179 Malignant neoplasm of small intestine, unspecified: Secondary | ICD-10-CM

## 2010-10-18 DIAGNOSIS — Z5111 Encounter for antineoplastic chemotherapy: Secondary | ICD-10-CM

## 2010-10-18 DIAGNOSIS — C7A098 Malignant carcinoid tumors of other sites: Secondary | ICD-10-CM

## 2010-10-18 DIAGNOSIS — C787 Secondary malignant neoplasm of liver and intrahepatic bile duct: Secondary | ICD-10-CM

## 2010-10-18 LAB — CBC WITH DIFFERENTIAL (CANCER CENTER ONLY)
BASO%: 0.7 % (ref 0.0–2.0)
EOS%: 0.4 % (ref 0.0–7.0)
MCH: 31.5 pg (ref 26.0–34.0)
MCHC: 34.8 g/dL (ref 32.0–36.0)
MONO%: 7.6 % (ref 0.0–13.0)
NEUT#: 4.8 10*3/uL (ref 1.5–6.5)
Platelets: 227 10*3/uL (ref 145–400)
RBC: 3.78 10*6/uL (ref 3.70–5.32)

## 2010-10-21 ENCOUNTER — Encounter: Payer: Self-pay | Admitting: Internal Medicine

## 2010-10-21 ENCOUNTER — Ambulatory Visit (INDEPENDENT_AMBULATORY_CARE_PROVIDER_SITE_OTHER): Payer: PRIVATE HEALTH INSURANCE | Admitting: Internal Medicine

## 2010-10-21 VITALS — BP 122/66 | HR 68 | Temp 97.7°F | Ht 61.0 in | Wt 107.7 lb

## 2010-10-21 DIAGNOSIS — I10 Essential (primary) hypertension: Secondary | ICD-10-CM

## 2010-10-21 DIAGNOSIS — R638 Other symptoms and signs concerning food and fluid intake: Secondary | ICD-10-CM

## 2010-10-21 DIAGNOSIS — E785 Hyperlipidemia, unspecified: Secondary | ICD-10-CM

## 2010-10-21 DIAGNOSIS — Z0000 Encounter for general adult medical examination without abnormal findings: Secondary | ICD-10-CM | POA: Insufficient documentation

## 2010-10-21 DIAGNOSIS — F329 Major depressive disorder, single episode, unspecified: Secondary | ICD-10-CM | POA: Insufficient documentation

## 2010-10-21 LAB — COMPREHENSIVE METABOLIC PANEL
ALT: 12 U/L (ref 0–35)
Albumin: 4.5 g/dL (ref 3.5–5.2)
CO2: 28 mEq/L (ref 19–32)
Calcium: 10.9 mg/dL — ABNORMAL HIGH (ref 8.4–10.5)
Chloride: 104 mEq/L (ref 96–112)
Creatinine, Ser: 1.34 mg/dL — ABNORMAL HIGH (ref 0.50–1.10)
Sodium: 144 mEq/L (ref 135–145)
Total Protein: 7.7 g/dL (ref 6.0–8.3)

## 2010-10-21 LAB — CHROMOGRANIN A

## 2010-10-21 MED ORDER — ENSURE PLUS PO LIQD: 237.0000 mL | Freq: Two times a day (BID) | ORAL | Status: DC

## 2010-10-21 NOTE — Assessment & Plan Note (Addendum)
Assessment: Disease Control:  controlled in 2010 with high HDL, I don't anticipate significant decline of disease control. Progress toward goals:  unable to assess, as pt has not had a FLP since 2010. Barriers to meeting goals:  pt does not prefer to get lab draws, although today she is agreeable to consider returning for a lab visit to complete FLP in the next month.  Plan: Treatment:   1) continue current medications     2) Will set up lab order to complete FLP in 1 month, instructed pt to be fasting for appt. CMP indicates normal liver function in 09/2010.

## 2010-10-21 NOTE — Progress Notes (Signed)
Subjective:    Patient ID: [name], female    DOB: July 06, 1945, 65 y.o.   MRN: [id number]  HPI Pt is a 65 y.o. female who  has a past medical history of carcinoid tumor, HTN, HLD, anemia, depression and who presents to clinic today for the following:    1) HTN - Patient does not check blood pressure regularly. Currently taking Amlodipine 10mg , HCTZ 25mg , and Clonidine 0.2 TID. Denies headaches, dizziness, lightheadedness, chest pain, shortness of breath.   2) HLD  - last FLP in 2010 shows good control on her current regimen of Simvastatin 20mg  daily. Denies chest pain, difficulty breathing, palpitations, tachycardia, and muscle pains.    3) Depression - feels well without medication requirement. Denies symptoms of anhedonia, sleep disturbances including excessive sleep or not enough sleep, difficulty with concentration, feelings of isolation, disengagement from previously enjoyable activities, suicidal ideation, homicidal ideation.  4) Preventative care - the patient is due for PCV immunization, tetanus shot, fasting lipid panel, colonoscopy, and mammogram. She is previously refused all of these interventions except for mammogram which was last performed in 2010 and was a BI-RADS 1. The patient indicates that she hates shots and therefore does not want to receive the recommended immunizations.  5) Decreased oral intake - the patient indicates that she's recently had decreased oral intake, she continues to have an appetite, however she feels like she is unable to eat enough to keep up with her weight. She was recommended by her oncologist to try to gain weight. She indicates that she is not having decreased appetite at this time, dysphasia, odynophagia, or sensation of other mechanical obstruction.   Review of Systems Per HPI.  Current Outpatient Medications Medication Sig  . amLODipine (NORVASC) 10 MG tablet Take 1 tablet (10 mg total) by mouth daily.  . cloNIDine (CATAPRES) 0.2 MG  tablet Take 1 tablet (0.2 mg total) by mouth 3 (three) times daily.  . hydrochlorothiazide 25 MG tablet Take 1 tablet (25 mg total) by mouth daily.  . Multiple Vitamins-Iron (MULTIVITAMIN/IRON) TABS Take 1 tablet by mouth daily.    . simvastatin (ZOCOR) 40 MG tablet Take 1/2 tablet(s) at bedtime forcholesterol  . Ensure Plus (ENSURE PLUS) LIQD Take 237 mLs by mouth 2 (two) times daily between meals.    Allergies Review of patient's allergies indicates no known allergies.  Past Medical History  Diagnosis Date  . 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 Myna Hidalgo w/sandostatin injections q monthly  . Anemia     iron deficiency  . Depression   . Hyperlipidemia   . Hypertension   . History of cocaine abuse     quit 03/06; seizure and HTN urgency secondary to use 12/04  . Seizures   . 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 Weight loss     15 lbs 08/07 (regained 104 08/07 and 112 lbs 12/07)  . Arthritis   . Halitosis     per notes  . Herpes zoster of eye 04/09    right eye        Objective:   Physical Exam General: Vital signs reviewed and noted. Well-developed, thin-appearing woman, in no acute distress; alert, appropriate and cooperative throughout examination.  Head: Normocephalic, atraumatic.  Neck: No deformities, masses, or tenderness noted.  Lungs:  Normal respiratory effort. Clear to auscultation BL without crackles or wheezes.  Heart: RRR. S1 and S2 normal without gallop, murmur, or rubs.  Abdomen:  BS normoactive. Soft, Nondistended, non-tender.  No masses or organomegaly.  Extremities: No pretibial edema.        Assessment & Plan:  Case and plan of care discussed with Dr. Doneen Poisson.

## 2010-10-21 NOTE — Assessment & Plan Note (Signed)
Lab Results  Component Value Date   NA 144 10/18/2010   NA 138 07/23/2009   K 3.2* 10/18/2010   K 3.3 07/23/2009   CL 104 10/18/2010   CL 96* 07/23/2009   CO2 28 10/18/2010   CO2 30 07/23/2009   BUN 25* 10/18/2010   BUN 16 07/23/2009   CREATININE 1.34* 10/18/2010   CREATININE 1.1 07/23/2009    BP Readings from Last 3 Encounters:  10/21/10 122/66  11/10/09 123/66  09/04/09 138/76    Assessment: Hypertension control:  controlled  Progress toward goals:  at goal Barriers to meeting goals:  no barriers identified  Plan: Hypertension treatment:  continue current medications

## 2010-10-21 NOTE — Assessment & Plan Note (Addendum)
-   Refuses PCV, Tdap today - Needs mammogram, referral sent. - Should also requests paperwork to be completed today for ADA paratransit eligibility.

## 2010-10-21 NOTE — Patient Instructions (Signed)
   Please follow-up at the clinic in 3-6 months, at which time we will reevaluate your blood pressure, appetite, cholesterol.  Please come for a lab visit to check your cholesterol checked within 1 month - please be fasting (no eating for 8 hours).  Ensure has been called to your pharmacy.  If you have been started on new medication(s), and you develop throat closing, tongue swelling, rash, please stop the medication and call the clinic at [phone] and go to the ER.  If you are diabetic, please bring your meter to your next visit.  If symptoms worsen, or new symptoms arise, please call the clinic or go to the ER.  Please bring all of your medications in a bag to your next visit.

## 2010-10-21 NOTE — Assessment & Plan Note (Signed)
Assessment: Disease Control:  controlled without medications and without indication of acute exacerbation. Progress toward goals:  at goal Barriers to meeting goals:  no barriers identified  Plan: Treatment:    continue off of medications at this time, which can be addressed in the future if issues arise.

## 2010-10-21 NOTE — Assessment & Plan Note (Signed)
Patient indicates that she has had decreased oral intake recently, however she denies symptoms of increased appetite or esophageal dysmotility. Reviewing weight over the past several years indicates that the patient has had stable weight since last seen by [place] in 2011, however she is noted to be decreasing in her weight per her oncologist. - Will start Ensure supplementation twice daily between meals which can be further escalated if needed. - We'll continue to monitor the patient's weight, with consideration of nutrition referral versus adding an appetite stimulant such as Megace in the future if despite Ensure, the patient continues to have decreased oral intake.

## 2010-10-27 ENCOUNTER — Ambulatory Visit (HOSPITAL_COMMUNITY): Payer: PRIVATE HEALTH INSURANCE

## 2010-11-09 ENCOUNTER — Ambulatory Visit (HOSPITAL_COMMUNITY): Payer: PRIVATE HEALTH INSURANCE

## 2010-11-15 ENCOUNTER — Encounter (HOSPITAL_BASED_OUTPATIENT_CLINIC_OR_DEPARTMENT_OTHER): Payer: PRIVATE HEALTH INSURANCE | Admitting: Hematology & Oncology

## 2010-11-15 DIAGNOSIS — C787 Secondary malignant neoplasm of liver and intrahepatic bile duct: Secondary | ICD-10-CM

## 2010-11-15 DIAGNOSIS — C7A098 Malignant carcinoid tumors of other sites: Secondary | ICD-10-CM

## 2010-12-01 ENCOUNTER — Ambulatory Visit (HOSPITAL_COMMUNITY)
Admission: RE | Admit: 2010-12-01 | Discharge: 2010-12-01 | Disposition: A | Discharge status: Home / Self Care | Payer: PRIVATE HEALTH INSURANCE | Source: Ambulatory Visit | Attending: Internal Medicine | Admitting: Internal Medicine

## 2010-12-01 DIAGNOSIS — Z0000 Encounter for general adult medical examination without abnormal findings: Secondary | ICD-10-CM

## 2010-12-01 DIAGNOSIS — Z1231 Encounter for screening mammogram for malignant neoplasm of breast: Secondary | ICD-10-CM | POA: Insufficient documentation

## 2010-12-13 ENCOUNTER — Other Ambulatory Visit: Payer: Self-pay | Admitting: Family

## 2010-12-13 ENCOUNTER — Other Ambulatory Visit: Payer: Self-pay | Admitting: Internal Medicine

## 2010-12-13 ENCOUNTER — Encounter (HOSPITAL_BASED_OUTPATIENT_CLINIC_OR_DEPARTMENT_OTHER): Payer: PRIVATE HEALTH INSURANCE | Admitting: Hematology & Oncology

## 2010-12-13 DIAGNOSIS — Z5111 Encounter for antineoplastic chemotherapy: Secondary | ICD-10-CM

## 2010-12-13 DIAGNOSIS — C179 Malignant neoplasm of small intestine, unspecified: Secondary | ICD-10-CM

## 2010-12-13 DIAGNOSIS — C787 Secondary malignant neoplasm of liver and intrahepatic bile duct: Secondary | ICD-10-CM

## 2010-12-13 DIAGNOSIS — C7A098 Malignant carcinoid tumors of other sites: Secondary | ICD-10-CM

## 2010-12-13 LAB — CBC WITH DIFFERENTIAL (CANCER CENTER ONLY)
BASO#: 0.1 10*3/uL (ref 0.0–0.2)
Eosinophils Absolute: 0.1 10*3/uL (ref 0.0–0.5)
HGB: 12.4 g/dL (ref 11.6–15.9)
LYMPH%: 23.5 % (ref 14.0–48.0)
MCH: 31.4 pg (ref 26.0–34.0)
MCV: 92 fL (ref 81–101)
MONO%: 9 % (ref 0.0–13.0)
RBC: 3.95 10*6/uL (ref 3.70–5.32)

## 2010-12-16 LAB — COMPREHENSIVE METABOLIC PANEL
Albumin: 4.6 g/dL (ref 3.5–5.2)
Alkaline Phosphatase: 57 U/L (ref 39–117)
BUN: 18 mg/dL (ref 6–23)
CO2: 29 mEq/L (ref 19–32)
Glucose, Bld: 115 mg/dL — ABNORMAL HIGH (ref 70–99)
Potassium: 3.3 mEq/L — ABNORMAL LOW (ref 3.5–5.3)
Total Bilirubin: 0.7 mg/dL (ref 0.3–1.2)

## 2010-12-16 LAB — CHROMOGRANIN A: Chromogranin A: 110 ng/mL — ABNORMAL HIGH (ref 1.9–15.0)

## 2011-01-10 ENCOUNTER — Encounter (HOSPITAL_BASED_OUTPATIENT_CLINIC_OR_DEPARTMENT_OTHER): Payer: PRIVATE HEALTH INSURANCE | Admitting: Hematology & Oncology

## 2011-01-10 ENCOUNTER — Other Ambulatory Visit: Payer: Self-pay | Admitting: Family

## 2011-01-10 DIAGNOSIS — C787 Secondary malignant neoplasm of liver and intrahepatic bile duct: Secondary | ICD-10-CM

## 2011-01-10 DIAGNOSIS — C7A098 Malignant carcinoid tumors of other sites: Secondary | ICD-10-CM

## 2011-01-10 DIAGNOSIS — C170 Malignant neoplasm of duodenum: Secondary | ICD-10-CM

## 2011-01-10 DIAGNOSIS — Z5111 Encounter for antineoplastic chemotherapy: Secondary | ICD-10-CM

## 2011-01-10 LAB — CBC WITH DIFFERENTIAL (CANCER CENTER ONLY)
BASO%: 1 % (ref 0.0–2.0)
Eosinophils Absolute: 0.1 10*3/uL (ref 0.0–0.5)
LYMPH#: 1.5 10*3/uL (ref 0.9–3.3)
LYMPH%: 25.7 % (ref 14.0–48.0)
MCV: 93 fL (ref 81–101)
MONO#: 0.6 10*3/uL (ref 0.1–0.9)
Platelets: 201 10*3/uL (ref 145–400)
RBC: 3.91 10*6/uL (ref 3.70–5.32)
WBC: 6 10*3/uL (ref 3.9–10.0)

## 2011-01-15 LAB — COMPREHENSIVE METABOLIC PANEL
ALT: 15 U/L (ref 0–35)
Albumin: 4.8 g/dL (ref 3.5–5.2)
CO2: 29 mEq/L (ref 19–32)
Calcium: 10.8 mg/dL — ABNORMAL HIGH (ref 8.4–10.5)
Chloride: 103 mEq/L (ref 96–112)
Glucose, Bld: 98 mg/dL (ref 70–99)
Potassium: 3.9 mEq/L (ref 3.5–5.3)
Sodium: 143 mEq/L (ref 135–145)
Total Bilirubin: 0.6 mg/dL (ref 0.3–1.2)
Total Protein: 7.8 g/dL (ref 6.0–8.3)

## 2011-01-15 LAB — CHROMOGRANIN A: Chromogranin A: 88 ng/mL — ABNORMAL HIGH (ref 1.9–15.0)

## 2011-02-08 ENCOUNTER — Encounter: Payer: Self-pay | Admitting: Hematology & Oncology

## 2011-02-11 ENCOUNTER — Other Ambulatory Visit: Payer: Self-pay | Admitting: Hematology & Oncology

## 2011-02-14 ENCOUNTER — Other Ambulatory Visit: Payer: Self-pay | Admitting: Family

## 2011-02-14 ENCOUNTER — Ambulatory Visit (HOSPITAL_BASED_OUTPATIENT_CLINIC_OR_DEPARTMENT_OTHER): Payer: PRIVATE HEALTH INSURANCE | Admitting: Hematology & Oncology

## 2011-02-14 ENCOUNTER — Other Ambulatory Visit (HOSPITAL_BASED_OUTPATIENT_CLINIC_OR_DEPARTMENT_OTHER): Payer: PRIVATE HEALTH INSURANCE | Admitting: Lab

## 2011-02-14 VITALS — BP 154/65 | HR 76 | Temp 97.3°F | Ht 61.0 in | Wt 112.0 lb

## 2011-02-14 DIAGNOSIS — C179 Malignant neoplasm of small intestine, unspecified: Secondary | ICD-10-CM

## 2011-02-14 DIAGNOSIS — Z5111 Encounter for antineoplastic chemotherapy: Secondary | ICD-10-CM

## 2011-02-14 DIAGNOSIS — N189 Chronic kidney disease, unspecified: Secondary | ICD-10-CM

## 2011-02-14 DIAGNOSIS — C7A098 Malignant carcinoid tumors of other sites: Secondary | ICD-10-CM

## 2011-02-14 DIAGNOSIS — D499 Neoplasm of unspecified behavior of unspecified site: Secondary | ICD-10-CM

## 2011-02-14 DIAGNOSIS — C787 Secondary malignant neoplasm of liver and intrahepatic bile duct: Secondary | ICD-10-CM

## 2011-02-14 LAB — CBC WITH DIFFERENTIAL (CANCER CENTER ONLY)
BASO%: 0.6 % (ref 0.0–2.0)
EOS%: 0.6 % (ref 0.0–7.0)
LYMPH#: 1.8 10*3/uL (ref 0.9–3.3)
MCHC: 34 g/dL (ref 32.0–36.0)
MONO#: 0.5 10*3/uL (ref 0.1–0.9)
NEUT%: 63.7 % (ref 39.6–80.0)
Platelets: 196 10*3/uL (ref 145–400)
RDW: 12.4 % (ref 11.1–15.7)
WBC: 6.4 10*3/uL (ref 3.9–10.0)

## 2011-02-14 MED ORDER — OCTREOTIDE ACETATE 30 MG IM KIT
30.0000 mg | PACK | Freq: Once | INTRAMUSCULAR | Status: AC
  Administered 2011-02-14: 30 mg via INTRAMUSCULAR

## 2011-02-14 NOTE — Progress Notes (Signed)
This office note has been dictated.

## 2011-02-15 NOTE — Progress Notes (Signed)
CC:   [name]. [name], M.D. Manning Charity, MD  DIAGNOSIS:  Metastatic carcinoid tumor.  CURRENT THERAPY:  Sandostatin 30 mg IM every month.  INTERIM HISTORY:  Ms. Alverson comes in for followup.  She is doing well.  She had a good Thanksgiving.  She ate well over Thanksgiving holidays.  She is watching her blood pressure.  She is taking her medications as she should.  There has been no abdominal pain.  There has been no diarrhea.  She has had no cough or shortness breath.  There has been no wheezing.  We last saw her, her chromogranin-A level was 88.  This is stable for her.  PHYSICAL EXAMINATION:  This is a petite appearing black female in no obvious distress.  Vital signs show temperature of 97.3, pulse 76, respiratory rate 18, blood pressure 154/65.  Weight is 112.  Head and neck exam shows a normocephalic, atraumatic skull.  There are no ocular or oral lesions.  There are no palpable cervical or supraclavicular lymph nodes.  Lungs: Clear bilaterally.  Cardiac:  Regular rate and rhythm with a normal S1, S2.  There are no murmurs, rubs or bruits. Abdomen:  Soft with good bowel sounds.  There is no palpable abdominal mass.  There is no fluid wave.  No palpable hepatosplenomegaly.  She has laparotomy scars that are well-healed  on her abdomen.  Extremities show no clubbing, cyanosis or edema.  Neurological exam shows no focal neurological deficits.  Skin exam:  No rash, ecchymosis or petechia.  LABORATORY STUDIES:  White cell count is 6.4, hemoglobin 12.7, hematocrit 37.3, platelet count is 196.  IMPRESSION:  Ms. Trinidad is a 65 year old African American female with metastatic carcinoid.  So far, she has done very well.  She is asymptomatic with this.  Her chromogranin-A level has gone up "up and down."  I do not see a need for any scans right now.  I think as long as she is stable and asymptomatic, we can just follow her along.  Will get her back for a Sandostatin  only in 1 month.  I will see back in 2 months.    ______________________________ Josph Macho, M.D. PRE/MEDQ  D:  02/14/2011  T:  02/15/2011  Job:  [id number] ADDENDUM: Chromogranin A is 88.

## 2011-02-19 LAB — COMPREHENSIVE METABOLIC PANEL
ALT: 29 U/L (ref 0–35)
AST: 32 U/L (ref 0–37)
CO2: 31 mEq/L (ref 19–32)
Calcium: 11.1 mg/dL — ABNORMAL HIGH (ref 8.4–10.5)
Chloride: 103 mEq/L (ref 96–112)
Creatinine, Ser: 1.09 mg/dL (ref 0.50–1.10)
Sodium: 144 mEq/L (ref 135–145)
Total Bilirubin: 0.7 mg/dL (ref 0.3–1.2)
Total Protein: 7.5 g/dL (ref 6.0–8.3)

## 2011-02-19 LAB — CHROMOGRANIN A: Chromogranin A: 122 ng/mL — ABNORMAL HIGH (ref 1.9–15.0)

## 2011-03-14 ENCOUNTER — Ambulatory Visit (HOSPITAL_BASED_OUTPATIENT_CLINIC_OR_DEPARTMENT_OTHER): Payer: PRIVATE HEALTH INSURANCE

## 2011-03-14 VITALS — BP 110/80 | HR 80 | Temp 96.8°F

## 2011-03-14 DIAGNOSIS — C787 Secondary malignant neoplasm of liver and intrahepatic bile duct: Secondary | ICD-10-CM

## 2011-03-14 DIAGNOSIS — C7A098 Malignant carcinoid tumors of other sites: Secondary | ICD-10-CM

## 2011-03-14 DIAGNOSIS — D499 Neoplasm of unspecified behavior of unspecified site: Secondary | ICD-10-CM

## 2011-03-14 MED ORDER — OCTREOTIDE ACETATE 30 MG IM KIT
30.0000 mg | PACK | Freq: Once | INTRAMUSCULAR | Status: AC
  Administered 2011-03-14: 30 mg via INTRAMUSCULAR

## 2011-03-30 ENCOUNTER — Other Ambulatory Visit: Payer: Self-pay | Admitting: *Deleted

## 2011-03-30 MED ORDER — SIMVASTATIN 40 MG PO TABS: 20.0000 mg | ORAL_TABLET | Freq: Every day | ORAL | Status: DC

## 2011-03-30 NOTE — Telephone Encounter (Signed)
Pt hasnt had fasting lipids since  2010 although scheduled in august 2012. So, ill fill 1 month and she needs a lab visit where she should fast for 12 h prior.please call to schedule. Thanks.  Johnette Abraham, D.O.

## 2011-04-01 NOTE — Telephone Encounter (Signed)
Pt called and she  asked that her labs be drawn at Dr Gustavo Lah office in [facility].  He is her oncologist and does labs every month.  Next visit 1/28. I called and they will drawn labs and fax to [place]. Pt informed she needs to be NPO for 12 hours.

## 2011-04-18 ENCOUNTER — Ambulatory Visit (HOSPITAL_BASED_OUTPATIENT_CLINIC_OR_DEPARTMENT_OTHER): Payer: Medicaid Other | Admitting: Hematology & Oncology

## 2011-04-18 ENCOUNTER — Other Ambulatory Visit (HOSPITAL_BASED_OUTPATIENT_CLINIC_OR_DEPARTMENT_OTHER): Payer: Medicaid Other | Admitting: Lab

## 2011-04-18 ENCOUNTER — Ambulatory Visit (HOSPITAL_BASED_OUTPATIENT_CLINIC_OR_DEPARTMENT_OTHER): Payer: Medicaid Other

## 2011-04-18 VITALS — BP 131/60 | HR 61 | Temp 96.1°F

## 2011-04-18 DIAGNOSIS — C7A098 Malignant carcinoid tumors of other sites: Secondary | ICD-10-CM

## 2011-04-18 DIAGNOSIS — D499 Neoplasm of unspecified behavior of unspecified site: Secondary | ICD-10-CM

## 2011-04-18 DIAGNOSIS — C787 Secondary malignant neoplasm of liver and intrahepatic bile duct: Secondary | ICD-10-CM

## 2011-04-18 DIAGNOSIS — D3A00 Benign carcinoid tumor of unspecified site: Secondary | ICD-10-CM

## 2011-04-18 LAB — CBC WITH DIFFERENTIAL (CANCER CENTER ONLY)
BASO#: 0 10*3/uL (ref 0.0–0.2)
BASO%: 0.5 % (ref 0.0–2.0)
EOS%: 0.5 % (ref 0.0–7.0)
HCT: 37.1 % (ref 34.8–46.6)
HGB: 12.4 g/dL (ref 11.6–15.9)
MCH: 30.8 pg (ref 26.0–34.0)
MCHC: 33.4 g/dL (ref 32.0–36.0)
MONO%: 7.9 % (ref 0.0–13.0)
NEUT%: 68.1 % (ref 39.6–80.0)
RDW: 12 % (ref 11.1–15.7)

## 2011-04-18 MED ORDER — OCTREOTIDE ACETATE 30 MG IM KIT
30.0000 mg | PACK | Freq: Once | INTRAMUSCULAR | Status: AC
  Administered 2011-04-18: 30 mg via INTRAMUSCULAR

## 2011-04-18 NOTE — Progress Notes (Signed)
This office note has been dictated.

## 2011-04-19 NOTE — Progress Notes (Signed)
CC:   [name]. [name], M.D. Manning Charity, MD  DIAGNOSIS:  Metastatic carcinoid tumor.  CURRENT THERAPY:  Sandostatin 30 mg IM every month.  INTERIM HISTORY:  [name] comes in for followup.  She is doing okay.  She got through the holidays without any problems.  She is feeling well.  There is no abdominal pain.  She has had no nausea or vomiting.  She has had no diarrhea.  She has had no bleeding. There has been no fever.  When we last saw her, her chromogranin A level was 122.  With [name], this tends to go up and down.  She has had no diarrhea.  There has been no wheezing.  PHYSICAL EXAMINATION:  This is a well-developed well-nourished black female in no obvious distress.  Vital signs:  Temperature of 96.1, pulse 61, respiratory 18, blood pressure 131/60.  Weight was 110.  Head and neck:  Normocephalic, atraumatic skull.  There are no ocular or oral lesions.  There are no palpable cervical or supraclavicular lymph nodes. Lungs:  Clear bilaterally.  Cardiac:  Regular rate and rhythm with normal S1, S2.  There are no murmurs, rubs, or bruits.  Abdomen:  Soft with good bowel sounds.  There is no palpable abdominal mass.  There is no fluid wave.  No palpable hepatosplenomegaly.  Back:  No tenderness over the spine, ribs, or hips.  Extremities:  No clubbing, cyanosis or edema.  Neurological:  No focal neurological deficits.  LABORATORY DATA:  White cell count 11.5, hemoglobin 12.4, hematocrit 37.1, platelet count 224.  IMPRESSION:  [name] is a 66 year old African American female with metastatic carcinoid.  She has done very well with this.  Again, chromogranin A level does tend to go up and down.  Her last CT scan, I think, was done back in May 2011.  For me, I think as long as she is asymptomatic, and we do not see a continued to rise of chromogranin A level, we could probably hold off on scanning her.  We will plan to get her back monthly for  Sandostatin injections.  I will plan to see her back myself in another couple months   ______________________________ Josph Macho, M.D. PRE/MEDQ  D:  04/18/2011  T:  04/19/2011  Job:  [id number]  ADDEDNUM:  CHROMGRANIN A is 94.

## 2011-04-22 LAB — COMPREHENSIVE METABOLIC PANEL
ALT: 13 U/L (ref 0–35)
AST: 18 U/L (ref 0–37)
Alkaline Phosphatase: 64 U/L (ref 39–117)
BUN: 20 mg/dL (ref 6–23)
Creatinine, Ser: 1.3 mg/dL — ABNORMAL HIGH (ref 0.50–1.10)
Total Bilirubin: 0.7 mg/dL (ref 0.3–1.2)

## 2011-05-16 ENCOUNTER — Ambulatory Visit (HOSPITAL_BASED_OUTPATIENT_CLINIC_OR_DEPARTMENT_OTHER): Payer: Medicaid Other

## 2011-05-16 VITALS — BP 154/68 | HR 69 | Temp 97.4°F

## 2011-05-16 DIAGNOSIS — C7A098 Malignant carcinoid tumors of other sites: Secondary | ICD-10-CM

## 2011-05-16 DIAGNOSIS — D499 Neoplasm of unspecified behavior of unspecified site: Secondary | ICD-10-CM

## 2011-05-16 DIAGNOSIS — C787 Secondary malignant neoplasm of liver and intrahepatic bile duct: Secondary | ICD-10-CM

## 2011-05-16 MED ORDER — OCTREOTIDE ACETATE 30 MG IM KIT
30.0000 mg | PACK | Freq: Once | INTRAMUSCULAR | Status: AC
  Administered 2011-05-16: 30 mg via INTRAMUSCULAR

## 2011-05-19 ENCOUNTER — Ambulatory Visit: Payer: PRIVATE HEALTH INSURANCE

## 2011-05-31 ENCOUNTER — Other Ambulatory Visit: Payer: Self-pay | Admitting: *Deleted

## 2011-05-31 NOTE — Telephone Encounter (Signed)
I talked to pt and she has labs done at cancer center and does not want to come here also. I called Dr Gustavo Lah office and they will draw a lipid panel for [place] on 3/25 and fax to [place]. I will send to you.

## 2011-05-31 NOTE — Telephone Encounter (Signed)
Pt is out of med.  Can you refill for her?

## 2011-05-31 NOTE — Telephone Encounter (Signed)
Will not refill until [name] gets a fasting lipid panel to assess the appropriateness of continued simvastatin.  Dr. Saralyn Pilar had requested this in August 2012 (visit) and in January 2013 (refill request), but it does not appear to have been done.  When completed we will be happy to refill the simvastatin if appropriate.

## 2011-06-01 NOTE — Telephone Encounter (Signed)
Please forward me the lipid panel when it becomes available.  Thank you!  Johnette Abraham, D.O.

## 2011-06-13 ENCOUNTER — Other Ambulatory Visit (HOSPITAL_BASED_OUTPATIENT_CLINIC_OR_DEPARTMENT_OTHER): Payer: Medicare HMO | Admitting: Lab

## 2011-06-13 ENCOUNTER — Ambulatory Visit (HOSPITAL_BASED_OUTPATIENT_CLINIC_OR_DEPARTMENT_OTHER): Payer: Medicaid Other | Admitting: Hematology & Oncology

## 2011-06-13 ENCOUNTER — Ambulatory Visit (HOSPITAL_BASED_OUTPATIENT_CLINIC_OR_DEPARTMENT_OTHER): Payer: Medicaid Other

## 2011-06-13 VITALS — BP 144/72 | HR 64 | Temp 97.5°F | Ht 61.0 in | Wt 111.0 lb

## 2011-06-13 DIAGNOSIS — C787 Secondary malignant neoplasm of liver and intrahepatic bile duct: Secondary | ICD-10-CM

## 2011-06-13 DIAGNOSIS — D499 Neoplasm of unspecified behavior of unspecified site: Secondary | ICD-10-CM

## 2011-06-13 DIAGNOSIS — C7A098 Malignant carcinoid tumors of other sites: Secondary | ICD-10-CM

## 2011-06-13 DIAGNOSIS — D3A00 Benign carcinoid tumor of unspecified site: Secondary | ICD-10-CM

## 2011-06-13 LAB — CBC WITH DIFFERENTIAL (CANCER CENTER ONLY)
BASO#: 0 10*3/uL (ref 0.0–0.2)
Eosinophils Absolute: 0 10*3/uL (ref 0.0–0.5)
HGB: 12.4 g/dL (ref 11.6–15.9)
LYMPH#: 1.4 10*3/uL (ref 0.9–3.3)
MONO#: 0.6 10*3/uL (ref 0.1–0.9)
NEUT#: 4.6 10*3/uL (ref 1.5–6.5)
RBC: 4.04 10*6/uL (ref 3.70–5.32)

## 2011-06-13 MED ORDER — OCTREOTIDE ACETATE 30 MG IM KIT
30.0000 mg | PACK | Freq: Once | INTRAMUSCULAR | Status: AC
  Administered 2011-06-13: 30 mg via INTRAMUSCULAR

## 2011-06-13 NOTE — Progress Notes (Signed)
This office note has been dictated.

## 2011-06-13 NOTE — Patient Instructions (Signed)

## 2011-06-14 NOTE — Progress Notes (Signed)
CC:   Jillian Charity, MD, [facility] [name]. [name], M.D.  DIAGNOSIS:  Metastatic carcinoid tumor.  CURRENT THERAPY:  Sandostatin 30 mg IM q. month.  INTERVAL HISTORY:  [name] comes in for her followup.  She is doing quite well.  She has had no complaints at all.  She has had no symptoms referable to the carcinoid.  Her chromogranin A level goes up and down.  When we last saw her in January, chromogranin A was 94.  She has had no problems with nausea or vomiting.  There has been no cough or shortness breath.  She still gets around with a cane secondary to her past CVA.  PHYSICAL EXAM:  General: This is a petite black female in no obvious distress.  Vital signs: Show a temperature of 97.5, pulse 64, heart rate 18, blood pressure 144/72, and weight is 111.  Head and neck exam shows a normocephalic, atraumatic skull.  There are no ocular or oral lesions. There are no palpable cervical or supraclavicular lymph nodes.  Lungs are clear bilaterally.  Cardiac examination:  Regular rate and rhythm with a normal S1 and S2.  There are no murmurs, rubs, or bruits. Abdominal exam: Soft with good bowel sounds.  There is no palpable abdominal mass.  There is no fluid wave.  There is no palpable hepatosplenomegaly.  She does have a healed surgical scar in the abdomen.  Back exam:  No tenderness over the spine, ribs, or hips. Extremities: Shows no clubbing, cyanosis, or edema.  LABORATORY STUDIES:  White cell count is 6.6, hemoglobin 12.4, hematocrit 37.1, and platelet count 238.  IMPRESSION:  [name] is a 66 year old African American female with a metastatic carcinoid.  She is asymptomatic with this.  She still has measurable disease, but again she asymptomatic.  I do not see a need to put her through any scans right now.  We will plan to get her back monthly for her Sandostatin.  I want to see her back myself in another 3  months.    ______________________________ Josph Macho, M.D. PRE/MEDQ  D:  06/13/2011  T:  06/13/2011  Job:  [id number]

## 2011-06-15 LAB — COMPREHENSIVE METABOLIC PANEL
Albumin: 4.9 g/dL (ref 3.5–5.2)
BUN: 26 mg/dL — ABNORMAL HIGH (ref 6–23)
Calcium: 11.3 mg/dL — ABNORMAL HIGH (ref 8.4–10.5)
Chloride: 101 mEq/L (ref 96–112)
Glucose, Bld: 79 mg/dL (ref 70–99)
Potassium: 3.4 mEq/L — ABNORMAL LOW (ref 3.5–5.3)

## 2011-06-15 LAB — CHROMOGRANIN A: Chromogranin A: 132 ng/mL — ABNORMAL HIGH (ref 1.9–15.0)

## 2011-06-21 ENCOUNTER — Ambulatory Visit (INDEPENDENT_AMBULATORY_CARE_PROVIDER_SITE_OTHER): Payer: Medicare HMO | Admitting: Internal Medicine

## 2011-06-21 ENCOUNTER — Encounter: Payer: Self-pay | Admitting: Internal Medicine

## 2011-06-21 VITALS — BP 150/70 | HR 80 | Temp 97.4°F | Ht 61.0 in | Wt 110.7 lb

## 2011-06-21 DIAGNOSIS — N183 Chronic kidney disease, stage 3 unspecified: Secondary | ICD-10-CM

## 2011-06-21 DIAGNOSIS — E785 Hyperlipidemia, unspecified: Secondary | ICD-10-CM

## 2011-06-21 DIAGNOSIS — I10 Essential (primary) hypertension: Secondary | ICD-10-CM

## 2011-06-21 DIAGNOSIS — Z0000 Encounter for general adult medical examination without abnormal findings: Secondary | ICD-10-CM

## 2011-06-21 LAB — LIPID PANEL
HDL: 66 mg/dL (ref >39)
LDL Cholesterol: 120 mg/dL — ABNORMAL HIGH (ref 0–99)
Triglycerides: 105 mg/dL (ref <150)

## 2011-06-21 NOTE — Progress Notes (Signed)
Subjective:    Patient ID: [name] female   DOB: October 28, 1945 66 y.o.   MRN: [id number]  HPI: Pt is a 66 y.o. female who  has a past medical history of carcinoid tumor, HTN, HLD, anemia, depression and who presents to clinic today for the following:  1) HTN - Patient does not check blood pressure regularly at home. Currently taking amlodipine (Norvase) 10mg , clonidine (Catapres) 0.2mg  TID, and hydrochlorothiazide (HCTZ) 25mg . Patient misses doses 0 x per week on average. denies headaches, dizziness, lightheadedness, chest pain, shortness of breath.  does request refills today.  2) HLD - currently taking Simvastatin 40mg  daily - out for 1 month.  denies chest pain, difficulty breathing, palpitations, tachycardia, and muscle pains. does request refills today.    3) Preventative health care - the patient is due for PCV immunization, tetanus shot, fasting lipid panel, colonoscopy, and mammogram. Given her metastatic carcinoid disease, likely may not obtain the long-term benefit of routine screening exams - which she continues to refuse. Last mammogram (2010)was a BI-RADS 1. The patient indicates that she hates shots and therefore does not want to receive the recommended immunizations.  Review of Systems: Per HPI.   Current Outpatient Medications: Medication Sig  . amLODipine (NORVASC) 10 MG tablet TAKE ONE (1) TABLET(S) DAILY  . cloNIDine (CATAPRES) 0.2 MG tablet Take 1 tablet (0.2 mg total) by mouth 3 (three) times daily.  . hydrochlorothiazide 25 MG tablet Take 1 tablet (25 mg total) by mouth daily.  . Multiple Vitamins-Iron (MULTIVITAMIN/IRON) TABS Take 1 tablet by mouth daily.   . simvastatin (ZOCOR) 40 MG tablet - has not taken x 1 month Take 0.5 tablets (20 mg total) by mouth at bedtime.   Allergies: No Known Allergies  Past Medical History  Diagnosis Date  . 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 Myna Hidalgo w/sandostatin  injections q monthly  . Anemia     iron deficiency  . Depression   . Hyperlipidemia   . Hypertension   . History of cocaine abuse     quit 03/06; seizure and HTN urgency secondary to use 12/04  . Seizures   . 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 Weight loss     15 lbs 08/07 (regained 104 08/07 and 112 lbs 12/07)  . Arthritis   . Halitosis     per notes  . Herpes zoster of eye 04/09    right eye  . Chronic kidney disease, stage III (moderate)     BL Cr 1.2-1.3    Past Surgical History: No past surgical history on file.   Objective:    Physical Exam: Filed Vitals:   06/21/11 1534  BP: 169/73  Pulse: 80  Temp: 97.4 F (36.3 C)    Recheck BP: 150/70 mmHg   General: Vital signs reviewed and noted. Well-developed, thin-appearing woman, in no acute distress; alert, appropriate and cooperative throughout examination.  Head: Normocephalic, atraumatic.  Neck: No deformities, masses, or tenderness noted.  Lungs:  Normal respiratory effort. Clear to auscultation BL without crackles or wheezes.  Heart: RRR. S1 and S2 normal without gallop, murmur, or rubs.  Abdomen:  BS normoactive. Soft, Nondistended, non-tender.  No masses or organomegaly.  Extremities: No pretibial edema.  Assessment/ Plan:   Case and plan of care discussed with Dr. Blanch Media.

## 2011-06-21 NOTE — Patient Instructions (Signed)
   Please follow-up at the clinic in 3-6 months, at which time we will reevaluate your blood pressure, cholesterol - OR, please follow-up in the clinic sooner if needed.  There have not been changes in your medications.   We will get labs, if they are abnormal, I will call.  If symptoms worsen, or new symptoms arise, please call the clinic or go to the ER.  Please bring all of your medications in a bag to your next visit.

## 2011-06-22 ENCOUNTER — Encounter: Payer: Self-pay | Admitting: Internal Medicine

## 2011-06-22 MED ORDER — SIMVASTATIN 20 MG PO TABS: 20.0000 mg | ORAL_TABLET | Freq: Every day | ORAL | Status: DC

## 2011-06-22 NOTE — Assessment & Plan Note (Signed)
Pertinent Data: BP Readings from Last 3 Encounters:  06/21/11 150/70  06/13/11 144/72  05/16/11 154/68    Basic Metabolic Panel:    Component Value Date/Time   NA 141 06/13/2011 1117   NA 138 07/23/2009 1000   K 3.4* 06/13/2011 1117   K 3.3 07/23/2009 1000   CL 101 06/13/2011 1117   CL 96* 07/23/2009 1000   CO2 29 06/13/2011 1117   CO2 30 07/23/2009 1000   BUN 26* 06/13/2011 1117   BUN 16 07/23/2009 1000   CREATININE 1.24* 06/13/2011 1117   CREATININE 1.1 07/23/2009 1000   GLUCOSE 79 06/13/2011 1117   GLUCOSE 106 07/23/2009 1000   CALCIUM 11.3* 06/13/2011 1117   CALCIUM 10.4* 07/23/2009 1000    Assessment: Disease Control: not controlled  Progress toward goals: unchanged  Barriers to meeting goals: no barriers identified, although I am concerned that there may be an element of noncompliance, as patient is not clearly able to tell me about her medications, her recollection is frequently changing about what medications are being taken.   Plan:  continue current medications  Requested pharmacy records for refill history.  If has been compliant with all medications, will plan to escalate her regimen. She is already maxed out on Amlodipine, HCTZ. Given her CKD, will consider addition of an ACE-I if needed in future.

## 2011-06-22 NOTE — Assessment & Plan Note (Signed)
Pertinent Labs: Liver Function Tests:    Component Value Date/Time   AST 18 06/13/2011 1117   AST 23 07/23/2009 1000   ALT 8 06/13/2011 1117   ALKPHOS 58 06/13/2011 1117   ALKPHOS 71 07/23/2009 1000   BILITOT 0.8 06/13/2011 1117   BILITOT 1.50 07/23/2009 1000   PROT 7.9 06/13/2011 1117   PROT 8.7* 07/23/2009 1000   ALBUMIN 4.9 06/13/2011 1117   Lipid Panel:     Component Value Date/Time   CHOL 207* 06/21/2011 1641   TRIG 105 06/21/2011 1641   HDL 66 06/21/2011 1641   CHOLHDL 3.1 06/21/2011 1641   VLDL 21 06/21/2011 1641   LDLCALC 120* 06/21/2011 1641    Assessment: Disease Control: controlled  Progress toward goals: at goal  Barriers to meeting goals: nonadherence to medications    Patient has been out of medication x 1 month.   Patient is not fasting today.   Plan:  continue current medications - refill sent for simvastatin.  Recheck FLP today.

## 2011-06-22 NOTE — Progress Notes (Signed)
Quick Note:  Has been off of medications x 1 month. Therefore, will restart medications and reassess in future. Ultimately, she has metastatic carcinoid ca, therefore, will need to confer with Dr. Myna Hidalgo regarding prognosis, and if long-term oriented medications are worthwhile. Johnette Abraham, D.O. ______

## 2011-06-22 NOTE — Assessment & Plan Note (Signed)
Health Maintenance  Topic Date Due  . Influenza Vaccine  12/20/2011  . Mammogram  11/30/2012  . Colonoscopy  10/16/2018  . Tetanus/tdap  10/16/2018  . Pneumococcal Polysaccharide Vaccine Age 67 And Over  10/11/2009  . Zostavax  Addressed    Assessment:  Patient is due for: colonoscopy, mammogram, PCV, Tdap, lipid panel.  However, she has metastatic carcinoid tumor, and does not want any of these measures.   Plan:  Lipid panel today.  Will continue to defer colonoscopy, mammogram, etc.   Will need to confer with Dr. Myna Hidalgo to assess prognosis of her cancer, and if good prognosis, may benefit from routine screening exams still.

## 2011-06-23 ENCOUNTER — Other Ambulatory Visit: Payer: Self-pay | Admitting: Internal Medicine

## 2011-07-11 ENCOUNTER — Ambulatory Visit (HOSPITAL_BASED_OUTPATIENT_CLINIC_OR_DEPARTMENT_OTHER): Payer: Medicare HMO

## 2011-07-11 VITALS — BP 130/60 | HR 56 | Temp 97.5°F

## 2011-07-11 DIAGNOSIS — C787 Secondary malignant neoplasm of liver and intrahepatic bile duct: Secondary | ICD-10-CM

## 2011-07-11 DIAGNOSIS — C7A098 Malignant carcinoid tumors of other sites: Secondary | ICD-10-CM

## 2011-07-11 DIAGNOSIS — D499 Neoplasm of unspecified behavior of unspecified site: Secondary | ICD-10-CM

## 2011-07-11 MED ORDER — OCTREOTIDE ACETATE 30 MG IM KIT
30.0000 mg | PACK | Freq: Once | INTRAMUSCULAR | Status: AC
  Administered 2011-07-11: 30 mg via INTRAMUSCULAR

## 2011-07-11 NOTE — Patient Instructions (Signed)

## 2011-07-13 ENCOUNTER — Other Ambulatory Visit: Payer: Self-pay | Admitting: *Deleted

## 2011-07-14 MED ORDER — HYDROCHLOROTHIAZIDE 25 MG PO TABS: 25.0000 mg | ORAL_TABLET | Freq: Every day | ORAL | Status: DC

## 2011-07-19 ENCOUNTER — Other Ambulatory Visit: Payer: Self-pay | Admitting: *Deleted

## 2011-07-19 NOTE — Telephone Encounter (Signed)
Error

## 2011-07-19 NOTE — Telephone Encounter (Signed)
Pt was called and made awared. 

## 2011-08-08 ENCOUNTER — Ambulatory Visit (HOSPITAL_BASED_OUTPATIENT_CLINIC_OR_DEPARTMENT_OTHER): Payer: Medicare HMO

## 2011-08-08 ENCOUNTER — Other Ambulatory Visit (HOSPITAL_BASED_OUTPATIENT_CLINIC_OR_DEPARTMENT_OTHER): Payer: Medicare HMO | Admitting: Lab

## 2011-08-08 ENCOUNTER — Ambulatory Visit (HOSPITAL_BASED_OUTPATIENT_CLINIC_OR_DEPARTMENT_OTHER): Payer: Medicare HMO | Admitting: Hematology & Oncology

## 2011-08-08 VITALS — BP 154/65 | HR 62 | Temp 97.4°F | Ht 61.0 in | Wt 106.0 lb

## 2011-08-08 DIAGNOSIS — D499 Neoplasm of unspecified behavior of unspecified site: Secondary | ICD-10-CM

## 2011-08-08 DIAGNOSIS — C787 Secondary malignant neoplasm of liver and intrahepatic bile duct: Secondary | ICD-10-CM

## 2011-08-08 DIAGNOSIS — D3A098 Benign carcinoid tumors of other sites: Secondary | ICD-10-CM

## 2011-08-08 DIAGNOSIS — C7A098 Malignant carcinoid tumors of other sites: Secondary | ICD-10-CM

## 2011-08-08 DIAGNOSIS — I10 Essential (primary) hypertension: Secondary | ICD-10-CM

## 2011-08-08 LAB — CBC WITH DIFFERENTIAL (CANCER CENTER ONLY)
BASO#: 0 10*3/uL (ref 0.0–0.2)
BASO%: 0.6 % (ref 0.0–2.0)
EOS%: 0.8 % (ref 0.0–7.0)
HCT: 35.2 % (ref 34.8–46.6)
HGB: 11.9 g/dL (ref 11.6–15.9)
LYMPH%: 30.2 % (ref 14.0–48.0)
MCHC: 33.8 g/dL (ref 32.0–36.0)
MCV: 93 fL (ref 81–101)
NEUT%: 59.4 % (ref 39.6–80.0)
RDW: 12.1 % (ref 11.1–15.7)

## 2011-08-08 LAB — CHCC SATELLITE - SMEAR

## 2011-08-08 MED ORDER — OCTREOTIDE ACETATE 30 MG IM KIT
30.0000 mg | PACK | Freq: Once | INTRAMUSCULAR | Status: AC
  Administered 2011-08-08: 30 mg via INTRAMUSCULAR

## 2011-08-08 NOTE — Patient Instructions (Signed)
Sandimmune cyclosporine IM injection What is this medicine? CYCLOSPORINE (SYE kloe spor een) is used to decrease the immune system's response to a transplanted organ. This medicine may be used for other purposes; ask your health care provider or pharmacist if you have questions. What should I tell my health care provider before I take this medicine? They need to know if you have any of these conditions: -cancer -high blood pressure -immune system problems -infection -kidney disease -liver disease -previous coal tar, PUVA, ultraviolet, or radiation therapy -an unusual or allergic reaction to cyclosporine, alcohol, corn oil, other medicines, foods, dyes, or preservatives -pregnant or trying to get pregnant -breast feeding How should I use this medicine? Take this medicine by mouth with a full glass of water. Do not take with grapefruit juice. Swallow the capsules whole. Do not chew or break the capsule. Follow the directions on the prescription label. Take your medicine at regular intervals. Take the capsules at the same time each day and at the same time in relation to meals. Do not take your medicine more often than directed. Do not stop taking except on your doctor's advice. Talk to your pediatrician regarding the use of this medicine in children. While this drug may be prescribed for children as young as 6 months for selected conditions, precautions do apply. Patients over 6 years old may have a stronger reaction and need a smaller dose. Overdosage: If you think you have taken too much of this medicine contact a poison control center or emergency room at once. NOTE: This medicine is only for you. Do not share this medicine with others. What if I miss a dose? If you miss a dose, take it as soon as you can. If it is almost time for your next dose, take only that dose. Do not take double or extra doses. Call your doctor or health care professional if you miss more than one dose or if you miss  doses on a regular basis. What may interact with this medicine? Do not take this medicine with any of the following medications: -bosentan -cidofovir -cisapride -mibefradil -ranolazine -red yeast rice, monascus purpureus -St. John's wort -tacrolimus This medicine may also interact with the following medications: -acyclovir -allopurinol -amiloride -amiodarone -bromocriptine -carbamazepine -certain antibiotics -cimetidine -colchicine -danazol -digoxin -female hormones, including contraceptive or birth control pills -imatinib -medicines for fungal infections like amphotericin B, fluconazole, itraconazole, terbinafine, and ketoconazole -medicines for blood pressure like diltiazem, nicardipine, verapamil, enalapril, ramipril, and losartan -medicines for cholesterol like lovastatin, simvastatin, atorvastatin, and fenofibrate -medicines for HIV infection like indinavir, nelfinavir, ritonavir, and saquinavir -medicines that suppress the immune system -melphalan -methotrexate -metoclopramide -NSAIDs, medicines for pain and inflammation, like ibuprofen or naproxen -octreotide -orlistat -oxcarbazepine -phenobarbital -phenytoin -ranitidine -sirolimus -spironolactone -steroid medicines like prednisone or cortisone -sulfinpyrazone -ticlopidine -triamterene -vaccines -voriconazole This list may not describe all possible interactions. Give your health care provider a list of all the medicines, herbs, non-prescription drugs, or dietary supplements you use. Also tell them if you smoke, drink alcohol, or use illegal drugs. Some items may interact with your medicine. What should I watch for while using this medicine? Visit your doctor or health care professional for regular checks on your progress. You will have regular blood checks. Do not change the brand of medicine unless directed by your doctor or health care professional. If you get a cold or other infection while receiving this  medicine, call your doctor or health care professional. Do not treat yourself. The medicine may decrease your  body's ability to fight infections. You may get drowsy or dizzy. Do not drive, use machinery, or do anything that needs mental alertness until you know how this medicine affects you. Do not stand or sit up quickly, especially if you are an older patient. This reduces the risk of dizzy or fainting spells. Alcohol may interfere with the effect of this medicine. Avoid alcoholic drinks. This medicine can make you more sensitive to the sun. Keep out of the sun. If you cannot avoid being in the sun, wear protective clothing and use sunscreen. Do not use sun lamps or tanning beds/booths. The medicine can cause unusual growth of gum tissue and can make your gums bleed. Practice good oral hygiene, and be careful when brushing and flossing your teeth. See your dentist regularly. What side effects may I notice from receiving this medicine? Side effects that you should report to your doctor or health care professional as soon as possible: -allergic reactions like skin rash, itching or hives, swelling of the face, lips, or tongue -changes in vision -high blood pressure -increased urge to urinate or frequent urination -numbness or tingling in the hands and feet -seizures -severe stomach pain -vomiting -yellowing of the skin or the whites of the eyes Side effects that usually do not require medical attention (report to your doctor or health care professional if they continue or are bothersome): -bleeding or tender gums, overgrowth of gum tissue -diarrhea -excessive hair growth on the face or body -nausea -tremors This list may not describe all possible side effects. Call your doctor for medical advice about side effects. You may report side effects to FDA at 1-800-FDA-1088. Where should I keep my medicine? Keep out of the reach of children. Store this medicine between 15 and 30 degrees C (59 and 86  degrees F). Keep the medicine in the original packaging. Throw away any unused medicine after the expiration date. NOTE: This sheet is a summary. It may not cover all possible information. If you have questions about this medicine, talk to your doctor, pharmacist, or health care provider.  2012, Elsevier/Gold Standard. (05/21/2007 11:21:19 AM)

## 2011-08-08 NOTE — Progress Notes (Signed)
This office note has been dictated.

## 2011-08-08 NOTE — Progress Notes (Signed)
CC:   Vernona Rieger, MD Oretha Ellis, M.D.  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  CURRENT THERAPY:  Sandostatin 30 mg IM monthly.  INTERIM HISTORY:  Ms. Fentress comes in for followup.  She comes in a wheelchair.  She said that she had some work done on her toenails properly by a podiatrist.  As such, her feet are a little bit sore right now.  Her appetite has been good.  She has had no diarrhea.  She has had no abdominal pain.  She has had no cough or shortness breath.  Her chromogranin A level when we last saw her was on the higher side.  I think her last chromogranin A level was 132.  Her last scans were done back 2 years ago.  She has been totally symptomatic so I have not do any scans on her.  This really has not altered how we follow her up.  She has had no issues with fevers, sweats or chills.  She has had no wheezing.  She has had no flushing.  PHYSICAL EXAMINATION:  General:  This is a petite black female in no obvious distress.  Vital Signs:  97.4, pulse 62, respiratory rate 18, blood pressure 154/65.  Weight is 106.  Head and Neck:  Normocephalic, atraumatic skull.  There are no ocular or oral lesions.  There are no palpable cervical or supraclavicular lymph nodes.  Lungs:  Clear to percussion and auscultation bilaterally.  Cardiac:  Regular rate and rhythm with a normal S1 and S2.  There are no murmurs, rubs, or bruits. Abdominal:  Soft with good bowel sounds.  There is no palpable abdominal mass.  There is no palpable hepatosplenomegaly.  Back:  No tenderness over the spine, ribs, or hips.  Extremities:  No clubbing, cyanosis or edema.  LABORATORY STUDIES:  White count is 5.2, hemoglobin 12, hematocrit 35, platelet count 198.  Her calcium is 10.8 with an albumin of 4.8.  IMPRESSION:  Ms. Eisenhuth is a 66 year old African American female with metastatic carcinoid.  We have been following her now for over 3 years.  Today, there is  no evidence that she really has progressed.  She was treated at [facility].  They tried to do surgery on her. Unfortunately, surgery was not a viable option.  She is totally asymptomatic right now.  I actually feel that we can still follow her supportively.  Her chromogranin A level does tend to fluctuate up and down a little bit.  We will go ahead and plan to get her back to see [place] in another 2 months so.  We plan to see her next month for her Sandostatin.    ______________________________ Josph Macho, M.D. PRE/MEDQ  D:  08/08/2011  T:  08/08/2011  Job:  [id number]

## 2011-08-12 LAB — COMPREHENSIVE METABOLIC PANEL
ALT: 10 U/L (ref 0–35)
AST: 14 U/L (ref 0–37)
Alkaline Phosphatase: 50 U/L (ref 39–117)
Sodium: 144 mEq/L (ref 135–145)
Total Bilirubin: 0.9 mg/dL (ref 0.3–1.2)
Total Protein: 7.3 g/dL (ref 6.0–8.3)

## 2011-08-12 LAB — CHROMOGRANIN A: Chromogranin A: 80 ng/mL — ABNORMAL HIGH (ref 1.9–15.0)

## 2011-08-16 ENCOUNTER — Other Ambulatory Visit: Payer: Self-pay | Admitting: *Deleted

## 2011-08-16 MED ORDER — HYDROCHLOROTHIAZIDE 25 MG PO TABS: 25.0000 mg | ORAL_TABLET | Freq: Every day | ORAL | Status: DC

## 2011-08-16 NOTE — Telephone Encounter (Signed)
Please call pt and let her know she needs to come in for an appointment. She was last seen in 10/2010, she was recommended followup in 3-6 months.  Please ask her to come in for additional refills.  Thank you.  Johnette Abraham, D.O.

## 2011-08-17 NOTE — Telephone Encounter (Signed)
Flag sent to front desk pool to sch appt per Dr Saralyn Pilar.

## 2011-08-25 ENCOUNTER — Ambulatory Visit (INDEPENDENT_AMBULATORY_CARE_PROVIDER_SITE_OTHER): Payer: Medicare HMO | Admitting: Internal Medicine

## 2011-08-25 ENCOUNTER — Encounter: Payer: Self-pay | Admitting: Internal Medicine

## 2011-08-25 ENCOUNTER — Encounter: Payer: Medicare HMO | Admitting: Internal Medicine

## 2011-08-25 VITALS — BP 151/68 | HR 59 | Temp 97.0°F | Wt 107.9 lb

## 2011-08-25 DIAGNOSIS — D499 Neoplasm of unspecified behavior of unspecified site: Secondary | ICD-10-CM

## 2011-08-25 DIAGNOSIS — E785 Hyperlipidemia, unspecified: Secondary | ICD-10-CM

## 2011-08-25 DIAGNOSIS — I10 Essential (primary) hypertension: Secondary | ICD-10-CM

## 2011-08-25 MED ORDER — AMLODIPINE BESYLATE 10 MG PO TABS: 10.0000 mg | ORAL_TABLET | Freq: Every day | ORAL | Status: DC

## 2011-08-25 NOTE — Progress Notes (Signed)
  Subjective:    Patient ID: [name], female    DOB: 19-Jun-1945, 66 y.o.   MRN: [id number]  HPI Patient is here today for routine 3 month follow up.  1. Her BP is high today but she has not been taking her norvasc and only taking clonidine and HCTZ. She does not know that she is supposed to be on Norvasc as well.  2. After reviewing the chart and looking at the note from Dr. Myna Hidalgo, it seems that she is responding well to the supportive care. The prognosis is not known at this time but the cancer center will keep following her as per the notes. She has her next appointment next month.  She has no complaints today.  She had her mammogram done last month. Does not want to get shots at this time.   Review of Systems  All other systems reviewed and are negative.       Objective:   Physical Exam  Constitutional: She is oriented to person, place, and time. She appears well-developed and well-nourished.  HENT:  Head: Normocephalic and atraumatic.  Eyes: Conjunctivae and EOM are normal. Pupils are equal, round, and reactive to light. No scleral icterus.  Neck: Normal range of motion. Neck supple.  Cardiovascular: Normal rate, regular rhythm, normal heart sounds and intact distal pulses.  Exam reveals no gallop and no friction rub.   No murmur heard. Pulmonary/Chest: Effort normal and breath sounds normal. No respiratory distress. She has no wheezes. She has no rales.  Neurological: She is alert and oriented to person, place, and time.       Right-sided hemiparesis  Psychiatric: She has a normal mood and affect. Her behavior is normal.          Assessment & Plan:

## 2011-08-25 NOTE — Assessment & Plan Note (Signed)
Patient is following up with Dr. Myna Hidalgo. Prognosis is uncertain at this time although patient's disease has not progressed significantly over the last few years.

## 2011-08-25 NOTE — Assessment & Plan Note (Signed)
Patient is more compliant with her simvastatin which was started at last office visit.

## 2011-08-25 NOTE — Patient Instructions (Signed)

## 2011-08-25 NOTE — Assessment & Plan Note (Signed)
Patient is hypertensive today. She is taking only 2 of her 3 assign medications. I have asked her to stop taking Norvasc as well. Reevaluate in 2-3 months once she starts taking Norvasc. No additional medications at this time. Although it is noted in Dr. Marlana Salvage- Thad Ranger charting that ACE inhibitor may be considered if blood pressure is still high.

## 2011-08-25 NOTE — Progress Notes (Signed)
Rx called in to pharmacy. 

## 2011-09-05 ENCOUNTER — Ambulatory Visit: Payer: Medicaid Other

## 2011-09-12 ENCOUNTER — Other Ambulatory Visit (HOSPITAL_BASED_OUTPATIENT_CLINIC_OR_DEPARTMENT_OTHER): Payer: Medicare HMO | Admitting: Lab

## 2011-09-12 ENCOUNTER — Ambulatory Visit (HOSPITAL_BASED_OUTPATIENT_CLINIC_OR_DEPARTMENT_OTHER): Payer: Medicare HMO

## 2011-09-12 VITALS — BP 117/65 | HR 72 | Temp 96.9°F

## 2011-09-12 DIAGNOSIS — C787 Secondary malignant neoplasm of liver and intrahepatic bile duct: Secondary | ICD-10-CM

## 2011-09-12 DIAGNOSIS — C7A098 Malignant carcinoid tumors of other sites: Secondary | ICD-10-CM

## 2011-09-12 DIAGNOSIS — I10 Essential (primary) hypertension: Secondary | ICD-10-CM

## 2011-09-12 DIAGNOSIS — D3A098 Benign carcinoid tumors of other sites: Secondary | ICD-10-CM

## 2011-09-12 DIAGNOSIS — D499 Neoplasm of unspecified behavior of unspecified site: Secondary | ICD-10-CM

## 2011-09-12 DIAGNOSIS — C7A00 Malignant carcinoid tumor of unspecified site: Secondary | ICD-10-CM

## 2011-09-12 LAB — CBC WITH DIFFERENTIAL (CANCER CENTER ONLY)
BASO#: 0 10*3/uL (ref 0.0–0.2)
EOS%: 0.8 % (ref 0.0–7.0)
Eosinophils Absolute: 0.1 10*3/uL (ref 0.0–0.5)
HGB: 12.1 g/dL (ref 11.6–15.9)
LYMPH%: 27.2 % (ref 14.0–48.0)
MCH: 31.4 pg (ref 26.0–34.0)
MCHC: 33.8 g/dL (ref 32.0–36.0)
MCV: 93 fL (ref 81–101)
MONO%: 8 % (ref 0.0–13.0)
NEUT%: 63.4 % (ref 39.6–80.0)
RBC: 3.85 10*6/uL (ref 3.70–5.32)

## 2011-09-12 MED ORDER — OCTREOTIDE ACETATE 30 MG IM KIT
30.0000 mg | PACK | Freq: Once | INTRAMUSCULAR | Status: AC
  Administered 2011-09-12: 30 mg via INTRAMUSCULAR

## 2011-09-15 LAB — COMPREHENSIVE METABOLIC PANEL
Albumin: 4.7 g/dL (ref 3.5–5.2)
Alkaline Phosphatase: 53 U/L (ref 39–117)
BUN: 27 mg/dL — ABNORMAL HIGH (ref 6–23)
Creatinine, Ser: 1.16 mg/dL — ABNORMAL HIGH (ref 0.50–1.10)
Glucose, Bld: 88 mg/dL (ref 70–99)
Total Bilirubin: 0.7 mg/dL (ref 0.3–1.2)

## 2011-09-20 ENCOUNTER — Other Ambulatory Visit: Payer: Self-pay | Admitting: *Deleted

## 2011-09-20 MED ORDER — HYDROCHLOROTHIAZIDE 25 MG PO TABS: 25.0000 mg | ORAL_TABLET | Freq: Every day | ORAL | Status: DC

## 2011-09-21 ENCOUNTER — Other Ambulatory Visit: Payer: Self-pay | Admitting: *Deleted

## 2011-10-03 ENCOUNTER — Other Ambulatory Visit: Payer: Medicaid Other | Admitting: Lab

## 2011-10-03 ENCOUNTER — Ambulatory Visit: Payer: Medicaid Other

## 2011-10-03 ENCOUNTER — Ambulatory Visit: Payer: Medicaid Other | Admitting: Hematology & Oncology

## 2011-10-10 ENCOUNTER — Ambulatory Visit (HOSPITAL_BASED_OUTPATIENT_CLINIC_OR_DEPARTMENT_OTHER): Payer: Medicare HMO

## 2011-10-10 ENCOUNTER — Other Ambulatory Visit: Payer: Self-pay | Admitting: Medical

## 2011-10-10 ENCOUNTER — Ambulatory Visit (HOSPITAL_BASED_OUTPATIENT_CLINIC_OR_DEPARTMENT_OTHER): Payer: Medicare HMO | Admitting: Medical

## 2011-10-10 ENCOUNTER — Encounter: Payer: Self-pay | Admitting: *Deleted

## 2011-10-10 ENCOUNTER — Other Ambulatory Visit (HOSPITAL_BASED_OUTPATIENT_CLINIC_OR_DEPARTMENT_OTHER): Payer: Medicaid Other | Admitting: Lab

## 2011-10-10 VITALS — BP 140/70 | HR 78 | Temp 97.5°F | Ht 61.0 in | Wt 103.0 lb

## 2011-10-10 DIAGNOSIS — D499 Neoplasm of unspecified behavior of unspecified site: Secondary | ICD-10-CM

## 2011-10-10 DIAGNOSIS — C7A098 Malignant carcinoid tumors of other sites: Secondary | ICD-10-CM

## 2011-10-10 DIAGNOSIS — C787 Secondary malignant neoplasm of liver and intrahepatic bile duct: Secondary | ICD-10-CM

## 2011-10-10 LAB — CBC WITH DIFFERENTIAL (CANCER CENTER ONLY)
BASO#: 0 10*3/uL (ref 0.0–0.2)
BASO%: 0.6 % (ref 0.0–2.0)
EOS%: 0.8 % (ref 0.0–7.0)
Eosinophils Absolute: 0 10*3/uL (ref 0.0–0.5)
HCT: 36 % (ref 34.8–46.6)
HGB: 12.2 g/dL (ref 11.6–15.9)
LYMPH#: 1.2 10*3/uL (ref 0.9–3.3)
LYMPH%: 24.9 % (ref 14.0–48.0)
MCH: 31.4 pg (ref 26.0–34.0)
MCHC: 33.9 g/dL (ref 32.0–36.0)
MCV: 93 fL (ref 81–101)
MONO#: 0.4 10*3/uL (ref 0.1–0.9)
MONO%: 7.4 % (ref 0.0–13.0)
NEUT#: 3.3 10*3/uL (ref 1.5–6.5)
NEUT%: 66.3 % (ref 39.6–80.0)
Platelets: 188 10*3/uL (ref 145–400)
RBC: 3.88 10*6/uL (ref 3.70–5.32)
RDW: 12.4 % (ref 11.1–15.7)
WBC: 5 10*3/uL (ref 3.9–10.0)

## 2011-10-10 MED ORDER — OCTREOTIDE ACETATE 30 MG IM KIT
30.0000 mg | PACK | Freq: Once | INTRAMUSCULAR | Status: AC
  Administered 2011-10-10: 30 mg via INTRAMUSCULAR

## 2011-10-10 NOTE — Progress Notes (Signed)
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  Current therapy: Sandostatin 30 mg IM monthly.  Interim history: Ms. Holladay presents today for an office followup visit.  Her friend accompanies her.  Overall, Ms. Bassinger reports, that she is doing quite well.  Her appetite has been good, although she has lost about 3 pounds.  She's not having any diarrhea, or abdominal pain.  She does not report any cough, or shortness of breath.  Her last chromogranin A level was 76.  Her last scans done 2 years ago .  Did not reveal any evidence of thoracic metastatic disease , and there had not been any change in widespread hepatic metastatic disease.  She has been totally asymptomatic, so we have not done any other scans on her.  She's not having any fevers, chills, or, night sweats.  She's not having any flushing.  She does walk with a cane, however, is still able to get around.  She continues to receive her Sandostatin 30 mg IM monthly.  Review of Systems: Pt. Denies any changes in their vision, hearing, adenopathy, fevers, chills, nausea, vomiting, diarrhea, constipation, chest pain, shortness of breath, passing blood, passing out, blacking out,  any changes in skin, joints, neurologic or psychiatric except as noted.  Physical Exam: This is a pleasant, 66 year old, African American, female, in no obvious distress Vitals: Temperature 97.5 degrees.  Pulse 78, respirations 18, blood pressure 140/70, weight 103 pounds HEENT reveals a normocephalic, atraumatic skull, no scleral icterus, no oral lesions  Neck is supple without any cervical or supraclavicular adenopathy.  Lungs are clear to auscultation bilaterally. There are no wheezes, rales or rhonci Cardiac is regular rate and rhythm with a normal S1 and S2. There are no murmurs, rubs, or bruits.  Abdomen is soft  with good bowel sounds there's a palpable mass. There is no palpable hepatosplenomegaly. There is no palpable fluid wave.  Musculoskeletal no tenderness of the spine, ribs, or hips.  Extremities there are no clubbing, cyanosis, or edema.  Skin no petechia, purpura or ecchymosis Neurologic is nonfocal.  Laboratory Data: Laboratory data is pending from today  Current Outpatient Prescriptions on File Prior to Visit  Medication Sig Dispense Refill  . amLODipine (NORVASC) 10 MG tablet Take 1 tablet (10 mg total) by mouth daily.  30 tablet  5  . cloNIDine (CATAPRES) 0.2 MG tablet TAKE ONE TABLET BY MOUTH THREE TIMES DAILY  270 tablet  0  . hydrochlorothiazide (HYDRODIURIL) 25 MG tablet Take 1 tablet (25 mg total) by mouth daily.  30 tablet  3  . Multiple Vitamins-Iron (MULTIVITAMIN/IRON) TABS Take 1 tablet by mouth daily.       . simvastatin (ZOCOR) 20 MG tablet Take 1 tablet (20 mg total) by mouth at bedtime.  30 tablet  5    Assessment/Plan: This is a 66 year old, African American, female, with the following issues: #1.  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  There really has been no evidence that she has progressed.  She was treated at [facility].  They did try to do surgery on her but unfortunately, surgery was not a viable option.  She currently remains totally asymptomatic.  Right now.  We will continue to follow her supportively, along with her chromogranin A. level.  She will continue to receive her Sandostatin 30 mg IM monthly.

## 2011-10-10 NOTE — Progress Notes (Signed)
rx for shower chair and handicap application given to  Patient at visit today.

## 2011-10-12 ENCOUNTER — Telehealth: Payer: Self-pay | Admitting: Hematology & Oncology

## 2011-10-12 NOTE — Telephone Encounter (Signed)
Per request mailed august schedule.

## 2011-10-13 LAB — COMPREHENSIVE METABOLIC PANEL
Alkaline Phosphatase: 47 U/L (ref 39–117)
BUN: 20 mg/dL (ref 6–23)
Glucose, Bld: 97 mg/dL (ref 70–99)
Total Bilirubin: 0.9 mg/dL (ref 0.3–1.2)

## 2011-10-13 LAB — CHROMOGRANIN A: Chromogranin A: 68 ng/mL — ABNORMAL HIGH (ref 1.9–15.0)

## 2011-10-21 ENCOUNTER — Encounter: Payer: Self-pay | Admitting: Licensed Clinical Social Worker

## 2011-10-21 NOTE — Progress Notes (Signed)
[name] presents today to [facility] with sister, [name] and rep payee, [name]. Pt is currently living in an apt with a room mate. [name] is planning to move into [facility] and will need personal care services upon her move.  CSW explained the [facility] Referral for Request for Independent Assessment.  Rep payee requesting a copy of the referral to be faxed to her office when faxed to [facility].  Fax No. [phone].

## 2011-10-27 ENCOUNTER — Other Ambulatory Visit: Payer: Self-pay | Admitting: Internal Medicine

## 2011-10-27 DIAGNOSIS — Z1231 Encounter for screening mammogram for malignant neoplasm of breast: Secondary | ICD-10-CM

## 2011-10-31 ENCOUNTER — Other Ambulatory Visit: Payer: Self-pay | Admitting: Internal Medicine

## 2011-11-07 ENCOUNTER — Other Ambulatory Visit: Payer: Medicare HMO | Admitting: Lab

## 2011-11-07 ENCOUNTER — Ambulatory Visit (HOSPITAL_BASED_OUTPATIENT_CLINIC_OR_DEPARTMENT_OTHER): Payer: Medicare HMO

## 2011-11-07 VITALS — BP 138/65 | HR 64 | Temp 97.1°F | Resp 18

## 2011-11-07 DIAGNOSIS — C787 Secondary malignant neoplasm of liver and intrahepatic bile duct: Secondary | ICD-10-CM

## 2011-11-07 DIAGNOSIS — D499 Neoplasm of unspecified behavior of unspecified site: Secondary | ICD-10-CM

## 2011-11-07 DIAGNOSIS — C7A098 Malignant carcinoid tumors of other sites: Secondary | ICD-10-CM

## 2011-11-07 MED ORDER — OCTREOTIDE ACETATE 30 MG IM KIT
30.0000 mg | PACK | Freq: Once | INTRAMUSCULAR | Status: AC
  Administered 2011-11-07: 30 mg via INTRAMUSCULAR

## 2011-12-01 ENCOUNTER — Ambulatory Visit (INDEPENDENT_AMBULATORY_CARE_PROVIDER_SITE_OTHER): Payer: Medicare HMO | Admitting: Internal Medicine

## 2011-12-01 ENCOUNTER — Encounter: Payer: Self-pay | Admitting: Internal Medicine

## 2011-12-01 ENCOUNTER — Ambulatory Visit (HOSPITAL_COMMUNITY)
Admission: RE | Admit: 2011-12-01 | Discharge: 2011-12-01 | Disposition: A | Discharge status: Home / Self Care | Payer: Medicare HMO | Source: Ambulatory Visit | Attending: Internal Medicine | Admitting: Internal Medicine

## 2011-12-01 VITALS — BP 131/77 | HR 77 | Temp 97.0°F | Ht 61.0 in | Wt 102.4 lb

## 2011-12-01 DIAGNOSIS — M7989 Other specified soft tissue disorders: Secondary | ICD-10-CM | POA: Insufficient documentation

## 2011-12-01 DIAGNOSIS — G8190 Hemiplegia, unspecified affecting unspecified side: Secondary | ICD-10-CM

## 2011-12-01 DIAGNOSIS — E785 Hyperlipidemia, unspecified: Secondary | ICD-10-CM

## 2011-12-01 DIAGNOSIS — D499 Neoplasm of unspecified behavior of unspecified site: Secondary | ICD-10-CM

## 2011-12-01 DIAGNOSIS — I10 Essential (primary) hypertension: Secondary | ICD-10-CM

## 2011-12-01 DIAGNOSIS — Z0000 Encounter for general adult medical examination without abnormal findings: Secondary | ICD-10-CM

## 2011-12-01 DIAGNOSIS — R634 Abnormal weight loss: Secondary | ICD-10-CM

## 2011-12-01 DIAGNOSIS — D509 Iron deficiency anemia, unspecified: Secondary | ICD-10-CM

## 2011-12-01 NOTE — Patient Instructions (Signed)
   Please follow-up at the clinic in 3 months, at which time we will reevaluate your blood pressure, weight loss - OR, please follow-up in the clinic sooner if needed.  There have not been changes in your medications.    PLEASE GET YOUR DOPPLER ULTRASOUND OF YOUR RIGHT LEG AS SOON AS POSSIBLE.  Please see the dietician about your nutritional intake.  You are getting labs today, if they are abnormal I will give you a call.   Please keep thinking about your pneumonia shot and colonoscopy - call me if you want these.  If symptoms worsen, or new symptoms arise, please call the clinic or go to the ER.  Please bring all of your medications in a bag to your next visit.

## 2011-12-01 NOTE — Progress Notes (Signed)
Subjective:    Patient ID: [name]   Gender: female   DOB: October 20, 1945   Age: 66 y.o.   MRN: [id number]  HPI: [name] is a 66 y.o. with a PMHx of carcinoid tumor, HTN, HLD, anemia, depression and who presents to clinic today with her sister for the following:  1) HTN - Patient does not check blood pressure regularly at home. Currently taking amlodipine (Norvase) 10mg , clonidine (Catapres) 0.2mg  TID, and hydrochlorothiazide (HCTZ) 25mg . Patient misses doses 0 x per week on average. denies headaches, dizziness, lightheadedness, chest pain, shortness of breath.  does not request refills today.  2) HLD - currently taking Simvastatin 40mg  daily - was previously not taking it regularly but recently has been.  denies chest pain, difficulty breathing, palpitations, tachycardia, and muscle pains. does not request refills today.    3) Weight loss - patient indicates that she does not eat well, 2-3 times a day small meals, but appetite is not great. Denies blood in stools, last mammogram was normal in 11/2010. No recent significant activity level, no alcohol/ drug use, no recent acute illness.  4) Preventative care - the patient is due for PCV immunization, tetanus shot, fasting lipid panel, colonoscopy. Given her metastatic carcinoid disease, likely may not obtain the long-term benefit of routine screening exams - which she continues to refuse. Both her sister and I discussed importance of preventative care with the patient and risks/ benefits, but she continues to refuse all and becomes tearful.   Review of Systems: Per HPI.   Current Outpatient Medications: Medication Sig  . amLODipine (NORVASC) 10 MG tablet Take 1 tablet (10 mg total) by mouth daily.  . cloNIDine (CATAPRES) 0.2 MG tablet TAKE ONE TABLET BY MOUTH THREE TIMES DAILY  . hydrochlorothiazide (HYDRODIURIL) 25 MG tablet Take 1 tablet (25 mg total) by mouth daily.  . Multiple Vitamins-Iron (MULTIVITAMIN/IRON) TABS  Take 1 tablet by mouth daily.   . simvastatin (ZOCOR) 20 MG tablet Take 1 tablet (20 mg total) by mouth at bedtime.    Allergies: No Known Allergies   Past Medical History  Diagnosis Date  . 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 Myna Hidalgo w/sandostatin injections q monthly  . Anemia     iron deficiency  . Depression   . Hyperlipidemia   . Hypertension   . History of cocaine abuse     quit 03/06; seizure and HTN urgency secondary to use 12/04  . Seizures   . 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 Weight loss     15 lbs 08/07 (regained 104 08/07 and 112 lbs 12/07)  . Arthritis   . Halitosis     per notes  . Herpes zoster of eye 04/09    right eye  . Chronic kidney disease, stage III (moderate)     BL Cr 1.2-1.3    No past surgical history on file.   Objective:    Physical Exam: Filed Vitals:   12/01/11 1413  BP: 131/77  Pulse: 77  Temp: 97 F (36.1 C)     General: Vital signs reviewed and noted. Well-developed, thin-appearing woman, in no acute distress; alert, appropriate and cooperative throughout examination.  Head: Normocephalic, atraumatic.  Neck: No deformities,  masses, or tenderness noted. No lymphadenopathy noted.  Lungs:  Normal respiratory effort. Clear to auscultation BL without crackles or wheezes.  Heart: RRR. S1 and S2 normal without gallop, murmur, or rubs.  Abdomen:  BS normoactive. Soft, Nondistended, non-tender.  No masses or organomegaly.  Extremities: 2+ right lower extremity pitting pretibial edema. No open lesions or sores identified.  No pretibial edema left LE.     Assessment/ Plan:   Case and plan of care discussed with Dr. Jonah Blue.

## 2011-12-01 NOTE — Progress Notes (Signed)
VASCULAR LAB PRELIMINARY  PRELIMINARY  PRELIMINARY  PRELIMINARY  Right lower extremity venous Doppler completed.    Preliminary report:  There is no DVT or SVT noted in the right lower extremity.  [name], 12/01/2011, 4:16 PM

## 2011-12-02 DIAGNOSIS — R634 Abnormal weight loss: Secondary | ICD-10-CM | POA: Insufficient documentation

## 2011-12-02 DIAGNOSIS — M7989 Other specified soft tissue disorders: Secondary | ICD-10-CM | POA: Insufficient documentation

## 2011-12-02 LAB — ANEMIA PANEL
%SAT: 14 % — ABNORMAL LOW (ref 20–55)
Ferritin: 461 ng/mL — ABNORMAL HIGH (ref 10–291)
Folate: >20 ng/mL
TIBC: 282 ug/dL (ref 250–470)
UIBC: 243 ug/dL (ref 125–400)

## 2011-12-02 LAB — BASIC METABOLIC PANEL
CO2: 29 mEq/L (ref 19–32)
Calcium: 11.5 mg/dL — ABNORMAL HIGH (ref 8.4–10.5)
Creat: 1.15 mg/dL — ABNORMAL HIGH (ref 0.50–1.10)
Glucose, Bld: 104 mg/dL — ABNORMAL HIGH (ref 70–99)

## 2011-12-02 LAB — LIPID PANEL
Cholesterol: 169 mg/dL (ref 0–200)
HDL: 78 mg/dL (ref >39)
Triglycerides: 82 mg/dL (ref <150)

## 2011-12-02 MED ORDER — LISINOPRIL 20 MG PO TABS: 20.0000 mg | ORAL_TABLET | Freq: Every day | ORAL | Status: DC

## 2011-12-02 NOTE — Progress Notes (Signed)
Quick Note:  Hypercalcemia noted, which is higher than prior. Possibly medication related to her HCTZ and her CKD. However, given metastatic ca history, this is concerning. Will bring back in < 1 week for repeat lab in addition to others. See note for details. ______

## 2011-12-02 NOTE — Progress Notes (Signed)
Quick Note:  Iron deficiency likely given transferrin sat of 14%, should be started on iron supplementation. Will need FOBT at next visit. ______

## 2011-12-02 NOTE — Assessment & Plan Note (Addendum)
ADDENDUM TO NOTE AFTER LAB RESULTS AVAILABLE:  Pertinent Data:  Ref. Range 08/08/2011  09/12/2011  10/10/2011  12/01/2011   Sodium Latest Range: 135-145 mEq/L 144 143 143 144  Potassium Latest Range: 3.5-5.3 mEq/L 3.3 (L) 3.3 (L) 3.1 (L) 3.6  Chloride Latest Range: 96-112 mEq/L 104 104 103 102  CO2 Latest Range: 19-32 mEq/L 31 31 31 29   BUN Latest Range: 6-23 mg/dL 28 (H) 27 (H) 20 22  Creatinine Latest Range: 0.50-1.10 mg/dL 1.19 (H) 1.47 (H) 8.29 (H) 1.15 (H)  Calcium Latest Range: 8.4-10.5 mg/dL 56.2 (H) 13.0 86.5 (H) 11.5 (H)  Glucose Latest Range: 70-99 mg/dL 78 88 97 784 (H)    Assessment: Patient is noted to have asymptomatic hypercalcemia (remains without polyuria, polydipsia, nausea/ vomiting, kidney stones, myalgias, confusion). Cause is not completely clear and may be multifactorial contributed by her thiazide diuretic, CKD. Possible hyperparathyroidism (likely secondary to her CKD or possible vitamin D deficiency due to decreased oral intake). However, I am also concerned about possible bony spread of patient's metastatic disease given chronicity of her metastatic carcinoid tumor (no recent Somatostatin-receptor scintigraphy (OctreoScan) or PET scan of which I am aware to evaluate extent of mets) - although she does not describe specific bone pains at this time. As well, I am concerned that she has not had screening colonoscopy over last 10 years.  Plan:      Discussed the plan of care with attending, Dr. Criselda Peaches.  Called patient and she wanted me to discuss results with her sister Cathlean Marseilles, therefore, information was provided to [facility] as listed below.   Also instructed patient to STOP hydrochlorothiazide for now and START Lisinopril.  Will need to come in next week for repeat labs (including renal function panel, intact PTH, 25-OH and 1,25-diOH vitamin D (can be low in CKD secondary to deficiency of 1-alpha hydroxylase enzyme that allows conversion to 1,25-diOH form).  I have  scheduled her for an appointment at [facility] on Wednesday, Sept 18, 2013 at 10:30AM with Dr. Tonny Branch.  Patient advised that if she starts to feel more poorly such as nausea, vomiting, confusion, abdominal pain, myalgias, she should be evaluated in the ED immediately and ca levels checked.  Cathlean Marseilles, patient's sister and chosen representative, expressed understanding of the plan of care and all questions were answered.  Will need to discuss finding with Dr. Myna Hidalgo (patient's primary oncologist as well) and obtain recommendations after repeat labs (if no other clear cause).

## 2011-12-02 NOTE — Assessment & Plan Note (Addendum)
Assessment: Unclear cause at this point, however, given history of metastatic disease and asymmetrical lower extremity edema (thereby a Wells DVT score of 2 consistent with high probability), I am concerned about potential DVT. Alternatively, if there is lymphatic obstruction from her cancer, this could potentially result in asymmetric edema. There is no evidence of active or acute infection as area is not significantly warm, no erythema, no open lesions, no fevers or chills.  Plan:      STAT right LE doppler ultrasound.   ADDENDUM TO ABOVE: I was called by the [place] tech that the doppler was negative for DVT.

## 2011-12-02 NOTE — Addendum Note (Signed)
Addended by: Priscella Mann on: 12/02/2011 10:28 AM   Modules accepted: Orders

## 2011-12-02 NOTE — Assessment & Plan Note (Addendum)
Pertinent Data: BP Readings from Last 3 Encounters:  12/01/11 131/77  11/07/11 138/65  10/10/11 140/70    Basic Metabolic Panel:    Component Value Date/Time   NA 144 12/01/2011 1528   NA 138 07/23/2009 1000   K 3.6 12/01/2011 1528   K 3.3 07/23/2009 1000   CL 102 12/01/2011 1528   CL 96* 07/23/2009 1000   CO2 29 12/01/2011 1528   CO2 30 07/23/2009 1000   BUN 22 12/01/2011 1528   BUN 16 07/23/2009 1000   CREATININE 1.15* 12/01/2011 1528   CREATININE 1.17* 10/10/2011 1152   GLUCOSE 104* 12/01/2011 1528   GLUCOSE 106 07/23/2009 1000   CALCIUM 11.5* 12/01/2011 1528   CALCIUM 10.4* 07/23/2009 1000    Assessment: Disease Control: controlled  Progress toward goals: at goal  Barriers to meeting goals: no barriers identified    Patient is on clonidine TID and is well controlled on this regimen.    Patient is compliant most of the time with prescribed medications.   Plan:  continue current medications   ADDENDUM TO ABOVE AFTER LABS RESULTED: Patient is noted to have hypercalcemia with a calcium of 11.5. This could be at least partially contributed by her thiazide diuretic. I will therefore STOP hydrochlorothiazide and START lisinopril 20mg  daily. She will come in for a visit next week for repeat labs (renal function panel, 25 OH and 1,25 OH vitamin D levels). At this time, can assess blood pressure control and escalate Lisinopril as appropriate. The patient was called and requested information to be provided to her sister Marylouise Stacks. I spoke with [facility] and had her write down the medication changes. As well, she was advised that if any s/s of allergic reaction or side effects from ACE-I, Ms. Janace Aris should be evaluated in the ED immediately.

## 2011-12-02 NOTE — Assessment & Plan Note (Signed)
Assessment: Likely secondary to her known metastatic carcinoid tumor. However, has not had colonoscopy in last 10 years and is not on Fe supplementation.  Plan:      Anemia panel today.  Needs FOBT next visit.  Continue to discuss colonoscopy, pt continues to refuse.

## 2011-12-02 NOTE — Assessment & Plan Note (Addendum)
Pertinent Data: Vitals with Age-Percentiles 06/13/2011 06/21/2011 06/21/2011 07/11/2011  Weight 50.349 kg 50.213 kg     Vitals with Age-Percentiles 08/08/2011 08/25/2011 09/12/2011 10/10/2011  Weight 48.081 kg 48.943 kg  46.72 kg   Vitals with Age-Percentiles 11/07/2011 12/01/2011  Weight  46.448 kg    Assessment: Weight has been stable since 09/2011, however, has declined in 2013. The differential diagnoses for this patient are vast and include weight loss due to decreased oral intake contributed by reduced appetite. Alternatively, possibly secondary to her metastatic carcinoid cancer. She additionally has history of iron-deficiency anemia, therefore, GI source in this patient that has refused colonoscopy on multiple occasions is also of concern. Endocrinopathies such as hyperthyroidism and diabetes are considered, however felt to be less likely given normal random blood sugars.   Plan:      Check TSH, anemia panel, BMET  Will refer to our dietician Lupita Leash to see if she can assess caloric intake and needs and recommend supplement (in the confines of her chronic kidney disease)

## 2011-12-02 NOTE — Progress Notes (Signed)
Quick Note:  Lipids at goal with LDL < 100 mg/dL, therefore, will continue current.  TSH wnl. ______

## 2011-12-02 NOTE — Assessment & Plan Note (Signed)
Pertinent Labs: Liver Function Tests:    Component Value Date/Time   AST 14 10/10/2011 1152   AST 23 07/23/2009 1000   ALT 9 10/10/2011 1152   ALKPHOS 47 10/10/2011 1152   ALKPHOS 71 07/23/2009 1000   BILITOT 0.9 10/10/2011 1152   BILITOT 1.50 07/23/2009 1000   PROT 7.3 10/10/2011 1152   PROT 8.7* 07/23/2009 1000   ALBUMIN 4.8 10/10/2011 1152    Lipid Panel:     Component Value Date/Time   CHOL 169 12/01/2011 1528   TRIG 82 12/01/2011 1528   HDL 78 12/01/2011 1528   CHOLHDL 2.2 12/01/2011 1528   VLDL 16 12/01/2011 1528   LDLCALC 75 12/01/2011 1528     Assessment: Goal LDL (per ATP guidelines): < 100 mg/dL (based on age, history of CVA)  Disease Control: not controlled  Progress toward goals: unable to assess  Barriers to meeting goals: prior nonadherence to medications, now is compliant     Patient is not fasting today.   Patient is compliant most of the time with prescribed medications.    Plan:  continue current medications  Check lipid panel today.

## 2011-12-02 NOTE — Assessment & Plan Note (Signed)
Health Maintenance  Topic Date Due  . Influenza Vaccine  12/20/2011  . Mammogram  11/30/2012  . Colonoscopy  10/16/2018  . Tetanus/tdap  10/16/2018  . Pneumococcal Polysaccharide Vaccine Age 67 And Over  10/11/2009  . Zostavax  Addressed    Assessment:  Procedures due: colonoscopy - the benefits and risks of this procedure were discussed in detail and emphasized by both me and her sister. However, the patient continues to refuse and becomes tearful with discussion.  Labs due: None  Immunizations due: PCV, flu shot, Tdap - the benefits/ risks were discussed in detail and patient encouraged to complete by myself and her sister - however, pt is frightened of needles and continues to refuse.  Plan:  Continue to discuss preventative care at each visit, defer for now secondary to patient refusal.  Pt or sister will call me if she decides to get colonoscopy so I can add order.  Will need to confer with Dr. Myna Hidalgo to assess prognosis of her cancer, and if good prognosis, may benefit from routine screening exams still.

## 2011-12-05 ENCOUNTER — Ambulatory Visit (HOSPITAL_COMMUNITY)
Admission: RE | Admit: 2011-12-05 | Discharge: 2011-12-05 | Disposition: A | Discharge status: Home / Self Care | Payer: Medicare HMO | Source: Ambulatory Visit | Attending: Internal Medicine | Admitting: Internal Medicine

## 2011-12-05 DIAGNOSIS — Z1231 Encounter for screening mammogram for malignant neoplasm of breast: Secondary | ICD-10-CM | POA: Insufficient documentation

## 2011-12-07 ENCOUNTER — Ambulatory Visit (INDEPENDENT_AMBULATORY_CARE_PROVIDER_SITE_OTHER): Payer: Medicare HMO | Admitting: Internal Medicine

## 2011-12-07 VITALS — BP 137/67 | HR 78 | Temp 98.2°F | Wt 103.2 lb

## 2011-12-07 DIAGNOSIS — R634 Abnormal weight loss: Secondary | ICD-10-CM

## 2011-12-07 DIAGNOSIS — D509 Iron deficiency anemia, unspecified: Secondary | ICD-10-CM

## 2011-12-07 DIAGNOSIS — I10 Essential (primary) hypertension: Secondary | ICD-10-CM

## 2011-12-07 NOTE — Patient Instructions (Addendum)
1.  Continue your medications as prescribed.  2.  Stop in the lab to have your blood drawn.  Either myself or Dr. Saralyn Pilar will give you a call to discuss the results.  3.  Take the Stool cards home.  Follow the instructions to get three samples and send them back to [place]. We will call you if we need to follow up on the results  4.  Follow up with your PCP, Dr. Saralyn Pilar in about 1 month to see how you are doing.

## 2011-12-07 NOTE — Progress Notes (Signed)
Subjective:   Patient ID: [name] female   DOB: 12/11/1945 66 y.o.   MRN: [id number]  HPI: Ms.[name] is a 67 y.o. woman who presents to clinic today for follow up from her last appointment.  She was found to have asymptomatic hypercalcemia at that time.  She has a history of metastatic carcinoid as well as CKD stage III.  She was also taking HCTZ at that time.  Following her last visit Dr. Saralyn Pilar stopped her HCTZ and started Lisinopril 20 mg for her blood pressure.  The patient states that she has been taking her medications as prescribed and states that she feels okay but still has a very poor appetite.    She also had a discussion with Dr. Saralyn Pilar last time about screening for colon cancer.  She is still very hesitant to have the procedure done.  We discussed other options today for screening and she agreed to the FOBT cards with the understanding that if they were positive that we would likely have to proceed to the colonoscopy to discover the source of bleeding.    Renal panel, intact PTH, Vit D levels, FOBT cards  Past Medical History  Diagnosis Date  . 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 Myna Hidalgo w/sandostatin injections q monthly  . Anemia     iron deficiency  . Depression   . Hyperlipidemia   . Hypertension   . History of cocaine abuse     quit 03/06; seizure and HTN urgency secondary to use 12/04  . Seizures   . 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 Weight loss     15 lbs 08/07 (regained 104 08/07 and 112 lbs 12/07)  . Arthritis   . Halitosis     per notes  . Herpes zoster of eye 04/09    right eye  . Chronic kidney disease, stage III (moderate)     BL Cr 1.2-1.3    Current Outpatient Prescriptions  Medication Sig Dispense Refill  . amLODipine (NORVASC) 10 MG tablet Take 1 tablet (10 mg total) by mouth daily.  30 tablet  5  . cloNIDine (CATAPRES) 0.2 MG tablet TAKE ONE TABLET BY MOUTH THREE TIMES DAILY  90 tablet  1  . lisinopril (PRINIVIL,ZESTRIL) 20 MG tablet Take 1 tablet (20 mg total) by mouth daily.  30 tablet  1  . Multiple Vitamins-Iron (MULTIVITAMIN/IRON) TABS Take 1 tablet by mouth daily.       . simvastatin (ZOCOR) 20 MG tablet Take 1 tablet (20 mg total) by mouth at bedtime.  30 tablet  5   Family History  Problem Relation Age of Onset  . Cancer Mother   . Cancer Father   . Diabetes Brother    History   Social History  . Marital Status: Single    Spouse Name: N/A    Number of Children: N/A  . Years of Education: N/A   Social History Main Topics  . Smoking status: Never Smoker   . Smokeless tobacco: Not on file  . Alcohol Use: No  . Drug Use: No     used cocaine once, had stroke  . Sexually Active: Not on file   Other Topics Concern  . Not on  file   Social History Narrative  . No narrative on file   Review of Systems: A full 12 system ROS is negative except as noted in the HPI and A&P.   Objective:  Physical Exam: Filed Vitals:   12/07/11 1027  BP: 137/67  Pulse: 78  Temp: 98.2 F (36.8 C)  TempSrc: Oral  Weight: 103 lb 3.2 oz (46.811 kg)   Constitutional: Vital signs reviewed.  Patient is a thin, elderly appearing woman in no acute distress and cooperative with exam. Alert and oriented x3.  Head: Normocephalic and atraumatic Ear: TM normal bilaterally Mouth: no erythema or exudates, MMM Eyes: PERRL, EOMI, conjunctivae pale, No scleral icterus.  Neck: Supple, Trachea midline normal ROM, No JVD, mass, thyromegaly, or carotid bruit present.  Cardiovascular: RRR, S1 normal, S2 normal, no MRG, pulses symmetric and intact bilaterally Pulmonary/Chest: CTAB, no wheezes, rales, or rhonchi Abdominal: Soft.  Non-tender, non-distended, bowel sounds are normal, no masses, organomegaly, or guarding present.  GU: no CVA tenderness Musculoskeletal: No joint deformities, erythema, or stiffness, ROM full and no nontender Hematology: no cervical, inginal, or axillary adenopathy.  Neurological: A&O x3, Strength is normal and symmetric bilaterally, cranial nerve II-XII are grossly intact, no focal motor deficit, sensory intact to light touch bilaterally.  Skin: Warm, dry and intact. No rash, cyanosis, or clubbing.  Psychiatric: Normal mood and labile affect. speech and behavior is normal. Judgment and thought content normal. Cognition and memory are normal.   Assessment & Plan:

## 2011-12-08 LAB — RENAL FUNCTION PANEL
BUN: 18 mg/dL (ref 6–23)
CO2: 29 mEq/L (ref 19–32)
Chloride: 110 mEq/L (ref 96–112)
Creat: 1.06 mg/dL (ref 0.50–1.10)

## 2011-12-11 LAB — VITAMIN D 1,25 DIHYDROXY
Vitamin D2 1, 25 (OH)2: 48 pg/mL
Vitamin D3 1, 25 (OH)2: 27 pg/mL

## 2011-12-12 ENCOUNTER — Ambulatory Visit (HOSPITAL_BASED_OUTPATIENT_CLINIC_OR_DEPARTMENT_OTHER): Payer: Medicare HMO | Admitting: Hematology & Oncology

## 2011-12-12 ENCOUNTER — Ambulatory Visit (HOSPITAL_BASED_OUTPATIENT_CLINIC_OR_DEPARTMENT_OTHER): Payer: Medicare HMO

## 2011-12-12 ENCOUNTER — Other Ambulatory Visit (HOSPITAL_BASED_OUTPATIENT_CLINIC_OR_DEPARTMENT_OTHER): Payer: Medicare HMO | Admitting: Lab

## 2011-12-12 VITALS — BP 145/48 | HR 60 | Temp 97.9°F | Resp 18 | Ht 61.0 in | Wt 107.0 lb

## 2011-12-12 DIAGNOSIS — C7A098 Malignant carcinoid tumors of other sites: Secondary | ICD-10-CM

## 2011-12-12 DIAGNOSIS — C787 Secondary malignant neoplasm of liver and intrahepatic bile duct: Secondary | ICD-10-CM

## 2011-12-12 DIAGNOSIS — D499 Neoplasm of unspecified behavior of unspecified site: Secondary | ICD-10-CM

## 2011-12-12 LAB — CBC WITH DIFFERENTIAL (CANCER CENTER ONLY)
BASO#: 0 10*3/uL (ref 0.0–0.2)
EOS%: 0.4 % (ref 0.0–7.0)
Eosinophils Absolute: 0 10*3/uL (ref 0.0–0.5)
HGB: 10.8 g/dL — ABNORMAL LOW (ref 11.6–15.9)
LYMPH#: 1.1 10*3/uL (ref 0.9–3.3)
MCHC: 32.9 g/dL (ref 32.0–36.0)
NEUT#: 3.6 10*3/uL (ref 1.5–6.5)
Platelets: 197 10*3/uL (ref 145–400)
RBC: 3.48 10*6/uL — ABNORMAL LOW (ref 3.70–5.32)

## 2011-12-12 LAB — CHCC SATELLITE - SMEAR

## 2011-12-12 MED ORDER — OCTREOTIDE ACETATE 30 MG IM KIT
30.0000 mg | PACK | Freq: Once | INTRAMUSCULAR | Status: AC
  Administered 2011-12-12: 30 mg via INTRAMUSCULAR

## 2011-12-12 NOTE — Progress Notes (Signed)
This office note has been dictated.

## 2011-12-12 NOTE — Patient Instructions (Signed)
Octreotide injection solution (Sandostatin)  What is this medicine? OCTREOTIDE (ok TREE oh tide) is used to reduced flushing and watery diarrhea caused by certain types of cancer.  This medicine may be used for other purposes; ask your health care provider or pharmacist if you have questions.  What should I tell my health care provider before I take this medicine?  They need to know if you have any of these conditions:  -gallbladder disease -kidney disease -liver disease -an unusual or allergic reaction to octreotide, other medicines, foods, dyes, or preservatives -pregnant or trying to get pregnant -breast-feeding  How should I use this medicine?  This medicine is for injection under the skin or into a vein (only in emergency situations). It is usually given by a health care professional in a hospital or clinic setting.  If you get this medicine at home, you will be taught how to prepare and give this medicine. Allow the injection solution to come to room temperature before use. Do not warm it artificially. Use exactly as directed. Take your medicine at regular intervals. Do not take your medicine more often than directed.  What may interact with this medicine? Do not take this medicine with any of the following medications: -cisapride -droperidol -general anesthetics -grepafloxacin -perphenazine -thioridazine This medicine may also interact with the following medications: -bromocriptine -cyclosporine -diuretics -medicines for blood pressure, heart disease, irregular heart beat -medicines for diabetes, including insulin -quinidine  This list may not describe all possible interactions. Give your health care provider a list of all the medicines, herbs, non-prescription drugs, or dietary supplements you use. Also tell them if you smoke, drink alcohol, or use illegal drugs. Some items may interact with your medicine.  What should I watch for while using this medicine? Visit  your doctor or health care professional for regular checks on your progress. To help reduce irritation at the injection site, use a different site for each injection and make sure the solution is at room temperature before use. This medicine may cause increases or decreases in blood sugar. Signs of high blood sugar include frequent urination, unusual thirst, flushed or dry skin, difficulty breathing, drowsiness, stomach ache, nausea, vomiting or dry mouth. Signs of low blood sugar include chills, cool, pale skin or cold sweats, drowsiness, extreme hunger, fast heartbeat, headache, nausea, nervousness or anxiety, shakiness, trembling, unsteadiness, tiredness, or weakness. Contact your doctor or health care professional right away if you experience any of these symptoms . What side effects may I notice from receiving this medicine? Side effects that you should report to your doctor or health care professional as soon as possible: -allergic reactions like skin rash, itching or hives, swelling of the face, lips, or tongue -changes in blood sugar -changes in heart rate -severe stomach pain Side effects that usually do not require medical attention (report to your doctor or health care professional if they continue or are bothersome): -diarrhea or constipation -gas or stomach pain -nausea, vomiting -pain, redness, swelling and irritation at site where injected This list may not describe all possible side effects. Call your doctor for medical advice about side effects. You may report side effects to FDA at 1-800-FDA-1088.    NOTE: This sheet is a summary. It may not cover all possible information. If you have questions about this medicine, talk to your doctor, pharmacist, or health care provider.  2012, [employer]/Gold Standard. (10/02/2007 4:56:04 PM)

## 2011-12-13 NOTE — Progress Notes (Signed)
CCElyn Peers, DO, [place] [phone]  DIAGNOSIS:  Metastatic carcinoid, liver mets.  CURRENT THERAPY:  Sandostatin 30 mg IM q.month.  INTERIM HISTORY:  Ms. Prowell comes in for followup.  She is doing better.  She has moved into a new apartment.  She said it is safer.  She can go out and do some more activities.  She continues to eat well.  She is not having diarrhea.  There has been no abdominal pain.  She has had no nausea.  She has had no cough or shortness of breath.  She still has some leg swelling.  The right leg is a little more swollen than the left.  We have checked this in the past.  This is more of a chronic issue.  Her chromogranin A level we have been following.  This has been elevated but one that has not been going up.  Last time we checked back in July, the chromogranin A was 68.  PHYSICAL EXAMINATION:  General:  This is a petite black female in no obvious distress.  Vital signs:  97.9, pulse 60, respiratory rate 18, blood pressure 145/48.  Weight is 107.  Head and neck:  Shows a normocephalic, atraumatic skull.  She has no ocular or oral lesions. There are no palpable cervical or supraclavicular lymph nodes.  Lungs: Clear bilaterally.  Cardiac:  Regular rate and rhythm with a normal S1, S2.  There are no murmurs, rubs or bruits.  Abdomen:  Soft with good bowel sounds.  There is no palpable abdominal mass.  There is no palpable hepatosplenomegaly.  Back:  No tenderness over the spine, ribs or hips.  Extremities:  Show no clubbing, cyanosis.  She does have some edema in her legs bilaterally.  Again, right leg has more edema than the left.  Again, this has been more chronic.  She has good range of motion of her joints.  There is some weakness over on her I think right side from a stroke.  LABORATORY STUDIES:  White cell count is 5.1, hemoglobin 10.8, hematocrit 32.8, platelet count is 197.  IMPRESSION:  Ms. Connolly is a 66 year old African American  female with metastatic carcinoid.  She is doing quite well from my point of view.  She has not had anything that was progressive as far as I can tell.  Her last scans were a little over 2 years ago.  I have not felt that we needed to do any additional scans because she has been asymptomatic.  We will go ahead and plan to continue the Sandostatin monthly.  Will plan to see her back ourselves in another 2 months.    ______________________________ Josph Macho, M.D. PRE/MEDQ  D:  12/12/2011  T:  12/12/2011  Job:  [id number]

## 2011-12-15 ENCOUNTER — Other Ambulatory Visit (INDEPENDENT_AMBULATORY_CARE_PROVIDER_SITE_OTHER): Payer: Medicare HMO

## 2011-12-15 DIAGNOSIS — R634 Abnormal weight loss: Secondary | ICD-10-CM

## 2011-12-15 DIAGNOSIS — D509 Iron deficiency anemia, unspecified: Secondary | ICD-10-CM

## 2011-12-15 DIAGNOSIS — Z1211 Encounter for screening for malignant neoplasm of colon: Secondary | ICD-10-CM

## 2011-12-15 DIAGNOSIS — Z79899 Other long term (current) drug therapy: Secondary | ICD-10-CM

## 2011-12-15 LAB — COMPREHENSIVE METABOLIC PANEL
ALT: 15 U/L (ref 0–35)
Albumin: 4.3 g/dL (ref 3.5–5.2)
Alkaline Phosphatase: 50 U/L (ref 39–117)
Glucose, Bld: 89 mg/dL (ref 70–99)
Potassium: 3.9 mEq/L (ref 3.5–5.3)
Sodium: 149 mEq/L — ABNORMAL HIGH (ref 135–145)
Total Protein: 6.7 g/dL (ref 6.0–8.3)

## 2011-12-15 LAB — CHROMOGRANIN A: Chromogranin A: 98 ng/mL — ABNORMAL HIGH (ref 1.9–15.0)

## 2011-12-15 LAB — POC HEMOCCULT BLD/STL (HOME/3-CARD/SCREEN)
Card #2 Fecal Occult Blod, POC: NEGATIVE
Card #3 Fecal Occult Blood, POC: NEGATIVE

## 2012-01-05 ENCOUNTER — Ambulatory Visit (INDEPENDENT_AMBULATORY_CARE_PROVIDER_SITE_OTHER): Payer: Medicare HMO | Admitting: Dietician

## 2012-01-05 ENCOUNTER — Ambulatory Visit (INDEPENDENT_AMBULATORY_CARE_PROVIDER_SITE_OTHER): Payer: Medicare HMO | Admitting: Internal Medicine

## 2012-01-05 ENCOUNTER — Ambulatory Visit: Payer: Medicare HMO | Admitting: Internal Medicine

## 2012-01-05 ENCOUNTER — Encounter: Payer: Self-pay | Admitting: Internal Medicine

## 2012-01-05 VITALS — Ht 61.0 in | Wt 101.9 lb

## 2012-01-05 VITALS — BP 134/70 | HR 69 | Temp 98.5°F | Ht 61.0 in | Wt 101.6 lb

## 2012-01-05 DIAGNOSIS — Z0000 Encounter for general adult medical examination without abnormal findings: Secondary | ICD-10-CM

## 2012-01-05 DIAGNOSIS — R634 Abnormal weight loss: Secondary | ICD-10-CM

## 2012-01-05 DIAGNOSIS — I10 Essential (primary) hypertension: Secondary | ICD-10-CM

## 2012-01-05 DIAGNOSIS — D499 Neoplasm of unspecified behavior of unspecified site: Secondary | ICD-10-CM

## 2012-01-05 DIAGNOSIS — E785 Hyperlipidemia, unspecified: Secondary | ICD-10-CM

## 2012-01-05 MED ORDER — SIMVASTATIN 20 MG PO TABS
20.0000 mg | ORAL_TABLET | Freq: Every day | ORAL | Status: DC
Start: 2012-01-05 — End: 2012-06-26

## 2012-01-05 NOTE — Patient Instructions (Addendum)
You need to gain weight. You should try in increase teh amount of food you eat to gain 1-2# a month.   Your weight today is 101#. It would be better for your weight to be closer to 105#- 110#   Diet for  Weight Gain A high protein, high calorie diet increases the amount of protein and calories you eat. You may need more protein and calories in your diet because of illness, surgery, injury, weight loss, or having a poor appetite. Eating high protein and high calorie foods can help you gain weight, heal, and recover after illness.    Dairy  Whole milk.  Whole milk yogurt.  Powdered milk.  Cheese.  [employer].  Instant breakfast products.  Eggnog. Tips for adding to your diet:  Use whole milk when making hot cereal, puddings, soups, and hot cocoa.  Add powdered milk to baked goods, smoothies, and milkshakes.  Make whole milk yogurt parfaits by adding granola, fruit, or nuts.  Add cheese to sandwiches, pastas, soups, and casseroles.  Add fruit to cottage cheese. Meat   Tender or chopped fine Beef, pork, and poultry.  Fish and seafood.  Peanut butter.  Dried beans.  Eggs. Tips for adding to your diet:  Make meat and cheese omelets.  Add eggs to salads and baked goods.  Add fish and seafood to salads.  Add meat and poultry to casseroles, salads, and soups.  Use peanut butter as a topping for pretzels, celery, crackers, or add it to baked goods.  Use beans in casseroles, dips, and spreads.   GENERAL GUIDELINES TO INCREASE CALORIES  Replace calorie-free drinks with calorie-containing drinks, such as milk, fruit juices, regular soda, milkshakes, and hot chocolate.  Try to eat 5-6 times a day instead of 3 large meals each day.  Keep snacks handy, such as nuts, trail mixes, dried fruit, and yogurt.  Choose foods with sauces and gravies.  Add dried fruits, honey, and half-and-half to hot or cold cereal.  Add extra fats when possible, such as butter,  sour cream, cream cheese, and salad dressings.  Add cheese to foods often.  Consider adding a clear liquid nutritional supplement to your diet. Your caregiver can give you recommendations.  HIGH CALORIE FOODS Grain/Starch  Baked goods, such as muffins and quick breads.  Croissants.  Pancakes and waffles. Vegetable   Sauted vegetables in oil.  Fried vegetables.  Salad greens with regular salad dressing or vinegar and oil. Fruit  Dried fruit.  Canned fruit in syrup.  Fruit juice. Fat  Avocado.  Butter or margarine.  Whipped cream.  Mayonnaise.  Salad dressing.  Peanuts and mixed nuts.  Cream cheese and sour cream. Sweets and Dessert  Cake.  Cookies.  Pie.  Ice cream.  Doughnuts and pastries.  Protein and meal replacement bars.  Jam, preserves, and jelly.  Candy bars.  Chocolate.  Chocolate, caramel, or other flavored syrups.  Avoid hard to chew foods, dry tough meat. Use casseroles with small chuncks of meat.  Call Lupita Leash Plyler for questions: [phone]

## 2012-01-05 NOTE — Progress Notes (Signed)
Patient ID: NASHLEY MAUNU, female   DOB: March 09, 1946, 66 y.o.   MRN: [id number]  Subjective:   Patient ID: JAIONNA VANSELOW female   DOB: Jul 23, 1945 66 y.o.   MRN: [id number]  HPI: Ms.Joyelle E Lederman is a 65 y.o. with a past medical history as outlined below, who presents for a followup visit.  Patient does not have any new complaints and feels good today. She wants to have her simvastatin be refilled.  1) HTN - patient is currently taking amlodipine 10 mg daily, clonidine 0.2 mg tid, lisinopril 20 mg daily. Today her blood pressure is 134/70. She does not have chest pain, shortness of breath or palpitation.  2) HLD - currently taking Simvastatin 40mg  daily. Her recent LDL was 75 at 12/01/11. Her AST and ALT were normal at 12/12/11. She did not noticed any side effects, such as muscle pain.   3) Carcinoid tumor: Patient has metastatic carcinoid. She has been followed up by Dr. Myna Hidalgo. She was seen by Dr.Ennever on 12/02/11. She is currently getting monthly Sandostatin IM. Per Dr.Enneve, patient is doing well.  Denies fever, chills, fatigue, headaches,  cough, chest pain, SOB,  abdominal pain,diarrhea, constipation, dysuria, urgency, frequency, hematuria.     Past Medical History  Diagnosis Date  . 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 Myna Hidalgo w/sandostatin injections q monthly  . Anemia     iron deficiency  . Depression   . Hyperlipidemia   . Hypertension   . History of cocaine abuse     quit 03/06; seizure and HTN urgency secondary to use 12/04  . Seizures   . 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 Weight loss     15 lbs 08/07 (regained 104 08/07 and 112 lbs 12/07)  . Arthritis   . Halitosis     per  notes  . Herpes zoster of eye 04/09    right eye  . Chronic kidney disease, stage III (moderate)     BL Cr 1.2-1.3   Current Outpatient Prescriptions  Medication Sig Dispense Refill  . amLODipine (NORVASC) 10 MG tablet Take 1 tablet (10 mg total) by mouth daily.  30 tablet  5  . cloNIDine (CATAPRES) 0.2 MG tablet TAKE ONE TABLET BY MOUTH THREE TIMES DAILY  90 tablet  1  . lisinopril (PRINIVIL,ZESTRIL) 20 MG tablet Take 1 tablet (20 mg total) by mouth daily.  30 tablet  1  . Multiple Vitamins-Iron (MULTIVITAMIN/IRON) TABS Take 1 tablet by mouth daily.       . simvastatin (ZOCOR) 20 MG tablet Take 1 tablet (20 mg total) by mouth at bedtime.  30 tablet  5  . DISCONTD: simvastatin (ZOCOR) 20 MG tablet Take 1 tablet (20 mg total) by mouth at bedtime.  30 tablet  5   Family History  Problem Relation Age of Onset  . Cancer Mother   . Cancer Father   . Diabetes Brother    History   Social History  . Marital Status: Single    Spouse Name: N/A    Number of Children: N/A  . Years of Education: N/A   Social History Main Topics  . Smoking status: Never Smoker   . Smokeless tobacco: None  . Alcohol Use:  No  . Drug Use: No     used cocaine once, had stroke  . Sexually Active: None   Other Topics Concern  . None   Social History Narrative  . None   Review of Systems: As per HPI  Objective:  Physical Exam: Filed Vitals:   01/05/12 1020 01/05/12 1021  BP: 134/70   Pulse: 69   Temp: 98.5 F (36.9 C)   TempSrc: Oral   Height: 5\' 1"  (1.549 m)   Weight: 101 lb 9.6 oz (46.085 kg) 101 lb 9.6 oz (46.085 kg)  SpO2: 98%    General:  Vital signs reviewed and noted. Well-developed, thin-appearing woman, in no acute distress; alert, appropriate and cooperative throughout examination.   Head:  Normocephalic, atraumatic.   Neck:  No deformities, masses, or tenderness noted. No lymphadenopathy noted.   Lungs:   Normal respiratory effort. Clear to auscultation BL without crackles or  wheezes.   Heart:  RRR. S1 and S2 normal without gallop, murmur, or rubs.   Abdomen:   BS normoactive. Soft, Nondistended, non-tender.  No masses or organomegaly.   Extremities:  1+ right lower extremity pitting pretibial edema. No open lesions or sores identified.    No pretibial edema left LE.      Assessment & Plan:

## 2012-01-05 NOTE — Assessment & Plan Note (Signed)
It is well controlled. Her last LDL was 75 on 12/01/11. She is taking simvastatin 20 mg daily. She did not noticed any side effects, such as muscle pain. Her liver function was normal on 12/12/11. We will continue current regimen. We'll give her refill prescription today.

## 2012-01-05 NOTE — Progress Notes (Signed)
Medical Nutrition Therapy:  Appt start time: 1030 end time:  1045.  Assessment:  Primary concerns today: Weight management. Has Weight loss BMI is 19- which is low normal for her height. Patient weight has been averaging between 101#-107# since March 2011. Was receiving cancer treatments during this time for large cecal carcinoid tumor. Patient has poor dentition that affects her speech but denies difficulty chewing or swallowing. Has aide who assists her 5-7 days a week Usual eating pattern includes 3 meals and 0-1 snacks per day. Intake described estimated to be ~1400-1600 calories/day which is appropriate for weight maintenance. Encourage ~1800 per day to increase weight to ~110# .  Usual physical activity includes ADLs.    Progress Towards Goal(s):  In progress.   Nutritional Diagnosis:  NI-1.4 Inadequate energy intake As related to cancer surgery and treatments.  As evidenced by her report and history of weight loss..    Intervention:   1- Nutrition Education about ways to increase caloric density of foods. Encouraged softer food that takes less energy to consume. Supplements 1-3 times daily is ideal as snacks.  2- Coordination of care- refer her to Cancer center RD for evaluation and monitoring  Monitoring/Evaluation:  Dietary intake, exercise, and body weight prn.

## 2012-01-05 NOTE — Assessment & Plan Note (Signed)
Her hypertension is well controlled. Today blood pressure is 134/70. Will continue current regimen.

## 2012-01-05 NOTE — Assessment & Plan Note (Signed)
-  Patient's mammogram, colonoscopy, DTaP and Zostavax are up-to-date. -Patient refused influenza vaccination and pneumococcal vaccination today. We'll postpone.

## 2012-01-05 NOTE — Patient Instructions (Signed)
1. You have done great job in taking all your medications. I appreciate it very much. Please continue doing that. 2. Please take all medications as prescribed.  3. If you have worsening of your symptoms or new symptoms arise, please call the clinic ([phone]), or go to the ER immediately if symptoms are severe.     

## 2012-01-05 NOTE — Assessment & Plan Note (Signed)
It is stable. The patient is currently followed up by Dr. Myna Hidalgo, last seen was on 12/19/11.  Per Dr. Myna Hidalgo, she is doing well. She is getting monthly Sandostatin injection. Will follow up.

## 2012-01-09 ENCOUNTER — Ambulatory Visit (HOSPITAL_BASED_OUTPATIENT_CLINIC_OR_DEPARTMENT_OTHER): Payer: Medicare HMO

## 2012-01-09 VITALS — BP 174/75 | HR 71 | Temp 97.4°F | Resp 20

## 2012-01-09 DIAGNOSIS — C7A098 Malignant carcinoid tumors of other sites: Secondary | ICD-10-CM

## 2012-01-09 DIAGNOSIS — D499 Neoplasm of unspecified behavior of unspecified site: Secondary | ICD-10-CM

## 2012-01-09 DIAGNOSIS — C787 Secondary malignant neoplasm of liver and intrahepatic bile duct: Secondary | ICD-10-CM

## 2012-01-09 MED ORDER — OCTREOTIDE ACETATE 30 MG IM KIT
30.0000 mg | PACK | Freq: Once | INTRAMUSCULAR | Status: AC
  Administered 2012-01-09: 30 mg via INTRAMUSCULAR

## 2012-01-09 NOTE — Patient Instructions (Signed)

## 2012-01-18 ENCOUNTER — Other Ambulatory Visit: Payer: Self-pay | Admitting: *Deleted

## 2012-01-18 MED ORDER — CLONIDINE HCL 0.2 MG PO TABS: 0.2000 mg | ORAL_TABLET | Freq: Three times a day (TID) | ORAL | Status: AC

## 2012-01-23 NOTE — Addendum Note (Signed)
Addended by: Bufford Spikes on: 01/23/2012 02:58 PM   Modules accepted: Orders

## 2012-02-01 ENCOUNTER — Other Ambulatory Visit: Payer: Self-pay | Admitting: *Deleted

## 2012-02-01 DIAGNOSIS — I10 Essential (primary) hypertension: Secondary | ICD-10-CM

## 2012-02-01 MED ORDER — LISINOPRIL 20 MG PO TABS: 20.0000 mg | ORAL_TABLET | Freq: Every day | ORAL | Status: DC

## 2012-02-01 NOTE — Telephone Encounter (Signed)
Talked with pharmacy about canceling all HCTZ orders per Dr Saralyn Pilar.

## 2012-02-06 ENCOUNTER — Other Ambulatory Visit (HOSPITAL_BASED_OUTPATIENT_CLINIC_OR_DEPARTMENT_OTHER): Payer: Medicare HMO | Admitting: Lab

## 2012-02-06 ENCOUNTER — Ambulatory Visit (HOSPITAL_BASED_OUTPATIENT_CLINIC_OR_DEPARTMENT_OTHER): Payer: Medicare HMO

## 2012-02-06 ENCOUNTER — Ambulatory Visit (HOSPITAL_BASED_OUTPATIENT_CLINIC_OR_DEPARTMENT_OTHER): Payer: Medicare HMO | Admitting: Medical

## 2012-02-06 VITALS — BP 167/55 | HR 75 | Temp 98.4°F | Resp 16 | Ht 61.0 in | Wt 106.0 lb

## 2012-02-06 DIAGNOSIS — C7A098 Malignant carcinoid tumors of other sites: Secondary | ICD-10-CM

## 2012-02-06 DIAGNOSIS — D499 Neoplasm of unspecified behavior of unspecified site: Secondary | ICD-10-CM

## 2012-02-06 DIAGNOSIS — C787 Secondary malignant neoplasm of liver and intrahepatic bile duct: Secondary | ICD-10-CM

## 2012-02-06 DIAGNOSIS — M7989 Other specified soft tissue disorders: Secondary | ICD-10-CM

## 2012-02-06 LAB — CBC WITH DIFFERENTIAL (CANCER CENTER ONLY)
BASO%: 0.7 % (ref 0.0–2.0)
EOS%: 0.5 % (ref 0.0–7.0)
LYMPH#: 1.3 10*3/uL (ref 0.9–3.3)
MCHC: 33.1 g/dL (ref 32.0–36.0)
MONO#: 0.4 10*3/uL (ref 0.1–0.9)
NEUT#: 3.9 10*3/uL (ref 1.5–6.5)
Platelets: 190 10*3/uL (ref 145–400)
RDW: 12.1 % (ref 11.1–15.7)
WBC: 5.7 10*3/uL (ref 3.9–10.0)

## 2012-02-06 MED ORDER — OCTREOTIDE ACETATE 30 MG IM KIT
30.0000 mg | PACK | Freq: Once | INTRAMUSCULAR | Status: AC
  Administered 2012-02-06: 30 mg via INTRAMUSCULAR

## 2012-02-06 NOTE — Progress Notes (Signed)
Diagnosis: Metastatic carcinoid, liver metastases.  Current therapy: Sandostatin 30 mg IM q. monthly.  Interim history: Ms. Dobos comes in today for an office followup visit.  Her friend accompanies her.  Ms. Lippard reports, that she's doing quite well.  She does not report any new polyps.  She is in a new apartment, and resides by herself.  She does have aides that come by and help her with bathing, fixing food, and other activities of daily living.  She's actually gained 5 pounds since we saw her last.  She's not reporting any diarrhea, or flushing symptoms.  She does not report any abdominal pain.  She denies any nausea, vomiting, cough, chest pain, shortness of breath.  She denies any fevers, chills, or night sweats.  She denies any headaches, visual changes, or rashes.  She does continue to have some leg swelling.  The right leg is a little more swollen than left.  This is more of a chronic issue.  We continue to follow her chromogranin A. level.  This has been elevated, but not has had a significant rise.  The last, time, we checked it was in September and it was 98.  Review of Systems: Constitutional:Negative for malaise/fatigue, fever, chills, weight loss, diaphoresis, activity change, appetite change, and unexpected weight change.  HEENT: Negative for double vision, blurred vision, visual loss, ear pain, tinnitus, congestion, rhinorrhea, epistaxis sore throat or sinus disease, oral pain/lesion, tongue soreness Respiratory: Negative for cough, chest tightness, shortness of breath, wheezing and stridor.  Cardiovascular: Negative for chest pain, palpitations, leg swelling, orthopnea, PND, DOE or claudication Gastrointestinal: Negative for nausea, vomiting, abdominal pain, diarrhea, constipation, blood in stool, melena, hematochezia, abdominal distention, anal bleeding, rectal pain, anorexia and hematemesis.  Genitourinary: Negative for dysuria, frequency, hematuria,  Musculoskeletal:  Negative for myalgias, back pain, joint swelling, arthralgias and gait problem.  Skin: Negative for rash, color change, pallor and wound.  Neurological:. Negative for dizziness/light-headedness, tremors, seizures, syncope, facial asymmetry, speech difficulty, weakness, numbness, headaches and paresthesias.  Hematological: Negative for adenopathy. Does not bruise/bleed easily.  Psychiatric/Behavioral:  Negative for depression, no loss of interest in normal activity or change in sleep pattern.   Physical Exam: This is a petite, 66 year old, black female, in no obvious distress Vitals: Temperature 90.4 degrees, pulse 75, respirations 16, blood pressure 167/55.  Weight 106 pounds HEENT reveals a normocephalic, atraumatic skull, no scleral icterus, no oral lesions  Neck is supple without any cervical or supraclavicular adenopathy.  Lungs are clear to auscultation bilaterally. There are no wheezes, rales or rhonci Cardiac is regular rate and rhythm with a normal S1 and S2. There are no murmurs, rubs, or bruits.  Abdomen is soft with good bowel sounds, there is no palpable mass. There is no palpable hepatosplenomegaly. There is no palpable fluid wave.  Musculoskeletal no tenderness of the spine, ribs, or hips.  Extremities there are no clubbing, cyanosis, trace edema of right leg Skin no petechia, purpura or ecchymosis Neurologic is nonfocal.  Laboratory Data: White count 5.7, hemoglobin 12.0, hematocrit 36.3, platelets 190,000  Current Outpatient Prescriptions on File Prior to Visit  Medication Sig Dispense Refill  . amLODipine (NORVASC) 10 MG tablet Take 1 tablet (10 mg total) by mouth daily.  30 tablet  5  . cloNIDine (CATAPRES) 0.2 MG tablet Take 1 tablet (0.2 mg total) by mouth 3 (three) times daily.  90 tablet  5  . lisinopril (PRINIVIL,ZESTRIL) 20 MG tablet Take 1 tablet (20 mg total) by mouth daily.  30  tablet  1  . Multiple Vitamins-Iron (MULTIVITAMIN/IRON) TABS Take 1 tablet by mouth  daily.       . simvastatin (ZOCOR) 20 MG tablet Take 1 tablet (20 mg total) by mouth at bedtime.  30 tablet  5   Assessment/Plan:  This is a 66 year old, African American female, with the following issues:  #1.  Metastatic carcinoid.  Overall, she seems to be doing quite well.  She does not show any progressive signs.  For symptomatic disease.  Her last CT scans were back in may of 2011.  She has not had scans since then, secondary to her being asymptomatic.  If we do see a continual rise in her chromogranin A., at that point, we can go ahead and repeat CT scans.  She will continue on Sandostatin monthly.  #2.  Followup.  Ms. Rottmann will follow back up with [place] in 2 months, but before then should there be questions or concerns.

## 2012-02-06 NOTE — Patient Instructions (Addendum)

## 2012-02-10 LAB — COMPREHENSIVE METABOLIC PANEL
ALT: 16 U/L (ref 0–35)
AST: 19 U/L (ref 0–37)
CO2: 27 mEq/L (ref 19–32)
Calcium: 10.9 mg/dL — ABNORMAL HIGH (ref 8.4–10.5)
Chloride: 105 mEq/L (ref 96–112)
Creatinine, Ser: 1.01 mg/dL (ref 0.50–1.10)
Sodium: 143 mEq/L (ref 135–145)
Total Protein: 7.7 g/dL (ref 6.0–8.3)

## 2012-02-10 LAB — CHROMOGRANIN A: Chromogranin A: 17.4 ng/mL — ABNORMAL HIGH (ref 1.9–15.0)

## 2012-02-10 LAB — LACTATE DEHYDROGENASE: LDH: 184 U/L (ref 94–250)

## 2012-02-27 ENCOUNTER — Other Ambulatory Visit: Payer: Self-pay | Admitting: *Deleted

## 2012-02-27 ENCOUNTER — Telehealth: Payer: Self-pay | Admitting: Dietician

## 2012-02-27 MED ORDER — AMLODIPINE BESYLATE 10 MG PO TABS: 10.0000 mg | ORAL_TABLET | Freq: Every day | ORAL | Status: AC

## 2012-02-27 NOTE — Telephone Encounter (Signed)
Congratulated patient on weight gain and encouraged continued intake of shakes and high calorie foods. Also informed her that she can follow up at Internal Medicine as needed.

## 2012-03-05 ENCOUNTER — Ambulatory Visit (HOSPITAL_BASED_OUTPATIENT_CLINIC_OR_DEPARTMENT_OTHER): Payer: Medicare HMO

## 2012-03-05 VITALS — BP 178/86 | HR 76 | Temp 98.4°F | Resp 18

## 2012-03-05 DIAGNOSIS — D499 Neoplasm of unspecified behavior of unspecified site: Secondary | ICD-10-CM

## 2012-03-05 DIAGNOSIS — C787 Secondary malignant neoplasm of liver and intrahepatic bile duct: Secondary | ICD-10-CM

## 2012-03-05 DIAGNOSIS — C7A098 Malignant carcinoid tumors of other sites: Secondary | ICD-10-CM

## 2012-03-05 MED ORDER — OCTREOTIDE ACETATE 30 MG IM KIT
30.0000 mg | PACK | Freq: Once | INTRAMUSCULAR | Status: AC
  Administered 2012-03-05: 30 mg via INTRAMUSCULAR

## 2012-03-05 NOTE — Patient Instructions (Signed)

## 2012-04-02 ENCOUNTER — Other Ambulatory Visit: Payer: Self-pay | Admitting: Internal Medicine

## 2012-04-09 ENCOUNTER — Ambulatory Visit (HOSPITAL_BASED_OUTPATIENT_CLINIC_OR_DEPARTMENT_OTHER): Payer: Medicare HMO | Admitting: Medical

## 2012-04-09 ENCOUNTER — Ambulatory Visit (HOSPITAL_BASED_OUTPATIENT_CLINIC_OR_DEPARTMENT_OTHER): Payer: Medicare HMO

## 2012-04-09 ENCOUNTER — Other Ambulatory Visit (HOSPITAL_BASED_OUTPATIENT_CLINIC_OR_DEPARTMENT_OTHER): Payer: Medicare HMO | Admitting: Lab

## 2012-04-09 VITALS — BP 166/73 | HR 84 | Temp 98.3°F | Resp 18

## 2012-04-09 DIAGNOSIS — C787 Secondary malignant neoplasm of liver and intrahepatic bile duct: Secondary | ICD-10-CM

## 2012-04-09 DIAGNOSIS — C7A098 Malignant carcinoid tumors of other sites: Secondary | ICD-10-CM

## 2012-04-09 DIAGNOSIS — D499 Neoplasm of unspecified behavior of unspecified site: Secondary | ICD-10-CM

## 2012-04-09 LAB — CBC WITH DIFFERENTIAL (CANCER CENTER ONLY)
Eosinophils Absolute: 0 10*3/uL (ref 0.0–0.5)
HCT: 38.5 % (ref 34.8–46.6)
HGB: 12.6 g/dL (ref 11.6–15.9)
LYMPH%: 27.3 % (ref 14.0–48.0)
MCH: 30.4 pg (ref 26.0–34.0)
MCHC: 32.7 g/dL (ref 32.0–36.0)
MONO#: 0.4 10*3/uL (ref 0.1–0.9)
RDW: 12.4 % (ref 11.1–15.7)

## 2012-04-09 MED ORDER — OCTREOTIDE ACETATE 30 MG IM KIT
30.0000 mg | PACK | Freq: Once | INTRAMUSCULAR | Status: AC
  Administered 2012-04-09: 30 mg via INTRAMUSCULAR

## 2012-04-09 NOTE — Patient Instructions (Signed)

## 2012-04-09 NOTE — Progress Notes (Signed)
Diagnosis: Metastatic carcinoid, liver metastases.  Current therapy: Sandostatin 30 mg IM q. monthly.  Interim history: [name] comes in today for an office followup visit.  [name] reports, that she's doing quite well.  She is now residing in an assisted living facility.  I think this is very good for her.  She is eating much better.  She's actually gained some weight.  She continues to have an aide come in weekly to help with bathing, and other activities of daily living.  Her chromogranin A. level has dropped significantly from 98.0 to 17.4   She's not reporting any diarrhea, or flushing symptoms.  She does not report any abdominal pain.  She denies any nausea, vomiting, cough, chest pain, shortness of breath.  She denies any fevers, chills, or night sweats.  She denies any headaches, visual changes, or rashes.  She does continue to have some leg swelling.  The right leg is a little more swollen than left.  This is more of a chronic issue.  We continue to follow her chromogranin A. level.  She remains asymptomatic.  Review of Systems: Constitutional:Negative for malaise/fatigue, fever, chills, weight loss, diaphoresis, activity change, appetite change, and unexpected weight change.  HEENT: Negative for double vision, blurred vision, visual loss, ear pain, tinnitus, congestion, rhinorrhea, epistaxis sore throat or sinus disease, oral pain/lesion, tongue soreness Respiratory: Negative for cough, chest tightness, shortness of breath, wheezing and stridor.  Cardiovascular: Negative for chest pain, palpitations, leg swelling, orthopnea, PND, DOE or claudication Gastrointestinal: Negative for nausea, vomiting, abdominal pain, diarrhea, constipation, blood in stool, melena, hematochezia, abdominal distention, anal bleeding, rectal pain, anorexia and hematemesis.  Genitourinary: Negative for dysuria, frequency, hematuria,  Musculoskeletal: Negative for myalgias, back pain, joint swelling,  arthralgias and gait problem.  Skin: Negative for rash, color change, pallor and wound.  Neurological:. Negative for dizziness/light-headedness, tremors, seizures, syncope, facial asymmetry, speech difficulty, weakness, numbness, headaches and paresthesias.  Hematological: Negative for adenopathy. Does not bruise/bleed easily.  Psychiatric/Behavioral:  Negative for depression, no loss of interest in normal activity or change in sleep pattern.   Physical Exam: This is a petite, 67 year old, black female, in no obvious distress Vitals: Temperature 98.3 degrees, pulse 84, respirations 18, blood pressure 166/73, weight 114 pounds HEENT reveals a normocephalic, atraumatic skull, no scleral icterus, no oral lesions  Neck is supple without any cervical or supraclavicular adenopathy.  Lungs are clear to auscultation bilaterally. There are no wheezes, rales or rhonci Cardiac is regular rate and rhythm with a normal S1 and S2. There are no murmurs, rubs, or bruits.  Abdomen is soft with good bowel sounds, there is no palpable mass. There is no palpable hepatosplenomegaly. There is no palpable fluid wave.  Musculoskeletal no tenderness of the spine, ribs, or hips.  Extremities there are no clubbing, cyanosis, trace edema of right leg Skin no petechia, purpura or ecchymosis Neurologic is nonfocal.  Laboratory Data: White count 6.2, hemoglobin 12.6, hematocrit 38.5.  Platelets 229,000  Current Outpatient Prescriptions on File Prior to Visit  Medication Sig Dispense Refill  . amLODipine (NORVASC) 10 MG tablet Take 1 tablet (10 mg total) by mouth daily.  30 tablet  5  . cloNIDine (CATAPRES) 0.2 MG tablet Take 1 tablet (0.2 mg total) by mouth 3 (three) times daily.  90 tablet  5  . lisinopril (PRINIVIL,ZESTRIL) 20 MG tablet TAKE ONE TABLET BY MOUTH ONE TIME DAILY  30 tablet  0  . Multiple Vitamins-Iron (MULTIVITAMIN/IRON) TABS Take 1 tablet by mouth daily.       Marland Kitchen  simvastatin (ZOCOR) 20 MG tablet Take 1  tablet (20 mg total) by mouth at bedtime.  30 tablet  5   Assessment/Plan:  This is a 67 year old, African American female, with the following issues:  #1.  Metastatic carcinoid.  Overall, she seems to be doing quite well.  She does not show any progressive signs for symptomatic disease.  Her last CT scans were back in may of 2011.  She has not had scans since then, secondary to her being asymptomatic.  Again, her chromogranin A. level dropped significantly.  She will remain on Sandostatin monthly.  #2.  Followup.  [name] will follow back up with [place] in 2 months, but before then should there be questions or concerns.

## 2012-04-12 LAB — CHROMOGRANIN A: Chromogranin A: 88 ng/mL — ABNORMAL HIGH (ref 1.9–15.0)

## 2012-04-12 LAB — COMPREHENSIVE METABOLIC PANEL
Albumin: 5 g/dL (ref 3.5–5.2)
CO2: 26 mEq/L (ref 19–32)
Calcium: 11.2 mg/dL — ABNORMAL HIGH (ref 8.4–10.5)
Glucose, Bld: 91 mg/dL (ref 70–99)
Potassium: 4 mEq/L (ref 3.5–5.3)
Sodium: 141 mEq/L (ref 135–145)
Total Bilirubin: 0.9 mg/dL (ref 0.3–1.2)
Total Protein: 7.7 g/dL (ref 6.0–8.3)

## 2012-04-12 LAB — LACTATE DEHYDROGENASE: LDH: 153 U/L (ref 94–250)

## 2012-05-07 ENCOUNTER — Ambulatory Visit (HOSPITAL_BASED_OUTPATIENT_CLINIC_OR_DEPARTMENT_OTHER): Payer: Medicare HMO

## 2012-05-07 VITALS — BP 159/55 | HR 76 | Temp 98.3°F | Resp 16 | Ht 61.0 in | Wt 101.0 lb

## 2012-05-07 DIAGNOSIS — C787 Secondary malignant neoplasm of liver and intrahepatic bile duct: Secondary | ICD-10-CM

## 2012-05-07 DIAGNOSIS — C7A098 Malignant carcinoid tumors of other sites: Secondary | ICD-10-CM

## 2012-05-07 DIAGNOSIS — D499 Neoplasm of unspecified behavior of unspecified site: Secondary | ICD-10-CM

## 2012-05-07 MED ORDER — OCTREOTIDE ACETATE 30 MG IM KIT
30.0000 mg | PACK | Freq: Once | INTRAMUSCULAR | Status: AC
  Administered 2012-05-07: 30 mg via INTRAMUSCULAR

## 2012-05-07 NOTE — Patient Instructions (Signed)

## 2012-05-09 ENCOUNTER — Other Ambulatory Visit: Payer: Self-pay | Admitting: *Deleted

## 2012-05-09 MED ORDER — LISINOPRIL 20 MG PO TABS: 20.0000 mg | ORAL_TABLET | Freq: Every day | ORAL | Status: AC

## 2012-05-10 ENCOUNTER — Ambulatory Visit: Payer: Medicare HMO

## 2012-05-30 ENCOUNTER — Telehealth: Payer: Self-pay | Admitting: Hematology & Oncology

## 2012-05-30 NOTE — Telephone Encounter (Signed)
Could not reach Pt no answer or machine. Talked with Sister she will let pt know 3-17 is moved to 3-18

## 2012-05-31 ENCOUNTER — Other Ambulatory Visit: Payer: Self-pay | Admitting: Medical

## 2012-05-31 DIAGNOSIS — D499 Neoplasm of unspecified behavior of unspecified site: Secondary | ICD-10-CM

## 2012-06-04 ENCOUNTER — Ambulatory Visit: Payer: Medicare HMO

## 2012-06-04 ENCOUNTER — Other Ambulatory Visit: Payer: Medicare HMO | Admitting: Lab

## 2012-06-04 ENCOUNTER — Ambulatory Visit: Payer: Medicare HMO | Admitting: Medical

## 2012-06-05 ENCOUNTER — Other Ambulatory Visit: Payer: Medicare HMO | Admitting: Lab

## 2012-06-05 ENCOUNTER — Ambulatory Visit: Payer: Medicare HMO

## 2012-06-05 ENCOUNTER — Ambulatory Visit: Payer: Medicare HMO | Admitting: Medical

## 2012-06-06 ENCOUNTER — Other Ambulatory Visit (HOSPITAL_BASED_OUTPATIENT_CLINIC_OR_DEPARTMENT_OTHER): Payer: Medicare HMO | Admitting: Lab

## 2012-06-06 ENCOUNTER — Ambulatory Visit (HOSPITAL_BASED_OUTPATIENT_CLINIC_OR_DEPARTMENT_OTHER): Payer: Medicare HMO

## 2012-06-06 ENCOUNTER — Ambulatory Visit (HOSPITAL_BASED_OUTPATIENT_CLINIC_OR_DEPARTMENT_OTHER): Payer: Medicare HMO | Admitting: Medical

## 2012-06-06 VITALS — BP 158/70 | HR 85 | Temp 98.6°F | Resp 16 | Ht 61.0 in | Wt 103.0 lb

## 2012-06-06 DIAGNOSIS — C787 Secondary malignant neoplasm of liver and intrahepatic bile duct: Secondary | ICD-10-CM

## 2012-06-06 DIAGNOSIS — C7A098 Malignant carcinoid tumors of other sites: Secondary | ICD-10-CM

## 2012-06-06 DIAGNOSIS — D499 Neoplasm of unspecified behavior of unspecified site: Secondary | ICD-10-CM

## 2012-06-06 LAB — CBC WITH DIFFERENTIAL (CANCER CENTER ONLY)
BASO#: 0.1 10*3/uL (ref 0.0–0.2)
BASO%: 0.8 % (ref 0.0–2.0)
EOS%: 1.4 % (ref 0.0–7.0)
HCT: 35.9 % (ref 34.8–46.6)
HGB: 11.9 g/dL (ref 11.6–15.9)
LYMPH#: 1.3 10*3/uL (ref 0.9–3.3)
MCHC: 33.1 g/dL (ref 32.0–36.0)
MONO#: 0.4 10*3/uL (ref 0.1–0.9)
NEUT#: 4.8 10*3/uL (ref 1.5–6.5)
WBC: 6.6 10*3/uL (ref 3.9–10.0)

## 2012-06-06 MED ORDER — OCTREOTIDE ACETATE 30 MG IM KIT
30.0000 mg | PACK | Freq: Once | INTRAMUSCULAR | Status: AC
  Administered 2012-06-06: 30 mg via INTRAMUSCULAR

## 2012-06-06 NOTE — Progress Notes (Signed)
Diagnosis: Metastatic carcinoid, liver metastases.  Current therapy: Sandostatin 30 mg IM q. monthly.  Interim history: [name] comes in today for an office followup visit.  [name] reports, that she's doing quite well.  She is now residing in an assisted living facility.  I think this is very good for her.  She is eating much better.  She's actually gained some weight.  She continues to have an aide come in weekly to help with bathing, and other activities of daily living.  Her chromogranin A. level has significantly increased from 17.4, to 88.0.  We will have to watch this closely.  We may have to repeat a CT scan with her next visit if it continues to increase.  She's not reporting any diarrhea, or flushing symptoms.  She does not report any abdominal pain.  She denies any nausea, vomiting, cough, chest pain, shortness of breath.  She denies any fevers, chills, or night sweats.  She denies any headaches, visual changes, or rashes.  She does continue to have some leg swelling of the right lower extremity.  She is actually going to see a new physician for this tomorrow.  This is more of a chronic issue.  We continue to follow her chromogranin A. level.  She remains asymptomatic.  Review of Systems: Constitutional:Negative for malaise/fatigue, fever, chills, weight loss, diaphoresis, activity change, appetite change, and unexpected weight change.  HEENT: Negative for double vision, blurred vision, visual loss, ear pain, tinnitus, congestion, rhinorrhea, epistaxis sore throat or sinus disease, oral pain/lesion, tongue soreness Respiratory: Negative for cough, chest tightness, shortness of breath, wheezing and stridor.  Cardiovascular: Negative for chest pain, palpitations, leg swelling, orthopnea, PND, DOE or claudication Gastrointestinal: Negative for nausea, vomiting, abdominal pain, diarrhea, constipation, blood in stool, melena, hematochezia, abdominal distention, anal bleeding, rectal  pain, anorexia and hematemesis.  Genitourinary: Negative for dysuria, frequency, hematuria,  Musculoskeletal: Negative for myalgias, back pain, joint swelling, arthralgias and gait problem.  Skin: Negative for rash, color change, pallor and wound.  Neurological:. Negative for dizziness/light-headedness, tremors, seizures, syncope, facial asymmetry, speech difficulty, weakness, numbness, headaches and paresthesias.  Hematological: Negative for adenopathy. Does not bruise/bleed easily.  Psychiatric/Behavioral:  Negative for depression, no loss of interest in normal activity or change in sleep pattern.   Physical Exam: This is a petite, 67 year old, black female, in no obvious distress Vitals: Temperature 98.6 degrees, pulse 85, respirations 16, blood pressure 150/70, weight 103 pounds HEENT reveals a normocephalic, atraumatic skull, no scleral icterus, no oral lesions  Neck is supple without any cervical or supraclavicular adenopathy.  Lungs are clear to auscultation bilaterally. There are no wheezes, rales or rhonci Cardiac is regular rate and rhythm with a normal S1 and S2. There are no murmurs, rubs, or bruits.  Abdomen is soft with good bowel sounds, there is no palpable mass. There is no palpable hepatosplenomegaly. There is no palpable fluid wave.  Musculoskeletal no tenderness of the spine, ribs, or hips.  Extremities there are no clubbing, cyanosis, trace edema of right leg Skin no petechia, purpura or ecchymosis Neurologic is nonfocal.  Laboratory Data:  White count 6.6, hemoglobin 1.9, hematocrit 35.9, platelets 234,000  Current Outpatient Prescriptions on File Prior to Visit  Medication Sig Dispense Refill  . amLODipine (NORVASC) 10 MG tablet Take 1 tablet (10 mg total) by mouth daily.  30 tablet  5  . cloNIDine (CATAPRES) 0.2 MG tablet Take 1 tablet (0.2 mg total) by mouth 3 (three) times daily.  90 tablet  5  .  lisinopril (PRINIVIL,ZESTRIL) 20 MG tablet TAKE ONE TABLET BY MOUTH  ONE TIME DAILY  30 tablet  0  . Multiple Vitamins-Iron (MULTIVITAMIN/IRON) TABS Take 1 tablet by mouth daily.       . simvastatin (ZOCOR) 20 MG tablet Take 1 tablet (20 mg total) by mouth at bedtime.  30 tablet  5   Assessment/Plan:  This is a 67 year old, African American female, with the following issues:  #1.  Metastatic carcinoid.  Overall, she seems to be doing quite well.  She does not show any progressive signs for symptomatic disease.  Her last CT scans were back in may of 2011.  She has not had scans since then, secondary to her being asymptomatic.  Her chromogranin A. level has jumped back up, as, such, we will have to watch her closely.  We may have to repeat a CT scan.  #2.  Followup.  [name] will follow back up with [place] in 2 months, but before then should there be questions or concerns.

## 2012-06-07 ENCOUNTER — Ambulatory Visit: Payer: Medicare HMO

## 2012-06-07 ENCOUNTER — Other Ambulatory Visit: Payer: Medicare HMO | Admitting: Lab

## 2012-06-07 ENCOUNTER — Ambulatory Visit: Payer: Medicare HMO | Admitting: Hematology & Oncology

## 2012-06-12 LAB — COMPREHENSIVE METABOLIC PANEL
ALT: 15 U/L (ref 0–35)
AST: 18 U/L (ref 0–37)
Albumin: 4.8 g/dL (ref 3.5–5.2)
Alkaline Phosphatase: 77 U/L (ref 39–117)
Potassium: 4 mEq/L (ref 3.5–5.3)
Sodium: 146 mEq/L — ABNORMAL HIGH (ref 135–145)
Total Bilirubin: 0.6 mg/dL (ref 0.3–1.2)
Total Protein: 7.5 g/dL (ref 6.0–8.3)

## 2012-06-22 ENCOUNTER — Telehealth: Payer: Self-pay | Admitting: Hematology & Oncology

## 2012-06-22 NOTE — Telephone Encounter (Signed)
Patient called and changed 07/02/12 apt time from 11:45 to 2:15

## 2012-06-26 ENCOUNTER — Other Ambulatory Visit: Payer: Self-pay | Admitting: Internal Medicine

## 2012-07-02 ENCOUNTER — Other Ambulatory Visit (HOSPITAL_BASED_OUTPATIENT_CLINIC_OR_DEPARTMENT_OTHER): Payer: Medicare HMO | Admitting: Lab

## 2012-07-02 ENCOUNTER — Ambulatory Visit: Payer: Medicare HMO

## 2012-07-02 ENCOUNTER — Ambulatory Visit (HOSPITAL_BASED_OUTPATIENT_CLINIC_OR_DEPARTMENT_OTHER): Payer: Medicare HMO

## 2012-07-02 ENCOUNTER — Other Ambulatory Visit: Payer: Medicare HMO | Admitting: Lab

## 2012-07-02 VITALS — BP 191/88 | HR 86 | Temp 98.6°F | Resp 18

## 2012-07-02 DIAGNOSIS — C787 Secondary malignant neoplasm of liver and intrahepatic bile duct: Secondary | ICD-10-CM

## 2012-07-02 DIAGNOSIS — D499 Neoplasm of unspecified behavior of unspecified site: Secondary | ICD-10-CM

## 2012-07-02 DIAGNOSIS — C7A098 Malignant carcinoid tumors of other sites: Secondary | ICD-10-CM

## 2012-07-02 LAB — CBC WITH DIFFERENTIAL (CANCER CENTER ONLY)
BASO#: 0 10*3/uL (ref 0.0–0.2)
BASO%: 0.7 % (ref 0.0–2.0)
EOS%: 1 % (ref 0.0–7.0)
HCT: 35.7 % (ref 34.8–46.6)
HGB: 12 g/dL (ref 11.6–15.9)
LYMPH%: 30.3 % (ref 14.0–48.0)
MCHC: 33.6 g/dL (ref 32.0–36.0)
MCV: 93 fL (ref 81–101)
NEUT%: 61.3 % (ref 39.6–80.0)
RDW: 12 % (ref 11.1–15.7)

## 2012-07-02 MED ORDER — OCTREOTIDE ACETATE 30 MG IM KIT
30.0000 mg | PACK | Freq: Once | INTRAMUSCULAR | Status: AC
  Administered 2012-07-02: 30 mg via INTRAMUSCULAR

## 2012-07-02 NOTE — Patient Instructions (Signed)

## 2012-07-07 LAB — COMPREHENSIVE METABOLIC PANEL
ALT: 20 U/L (ref 0–35)
AST: 21 U/L (ref 0–37)
BUN: 15 mg/dL (ref 6–23)
CO2: 25 mEq/L (ref 19–32)
Calcium: 10.9 mg/dL — ABNORMAL HIGH (ref 8.4–10.5)
Chloride: 105 mEq/L (ref 96–112)
Creatinine, Ser: 1.04 mg/dL (ref 0.50–1.10)
Total Bilirubin: 0.6 mg/dL (ref 0.3–1.2)

## 2012-07-07 LAB — LACTATE DEHYDROGENASE: LDH: 178 U/L (ref 94–250)

## 2012-07-23 ENCOUNTER — Inpatient Hospital Stay (HOSPITAL_COMMUNITY)
Admission: EM | Admit: 2012-07-23 | Discharge: 2012-07-27 | DRG: 470 | Disposition: A | Discharge status: Skilled Nursing Facility | Payer: Medicare HMO | Attending: Internal Medicine | Admitting: Internal Medicine

## 2012-07-23 ENCOUNTER — Emergency Department (HOSPITAL_COMMUNITY): Payer: Medicare HMO

## 2012-07-23 ENCOUNTER — Encounter (HOSPITAL_COMMUNITY): Payer: Self-pay | Admitting: Emergency Medicine

## 2012-07-23 DIAGNOSIS — D499 Neoplasm of unspecified behavior of unspecified site: Secondary | ICD-10-CM

## 2012-07-23 DIAGNOSIS — N390 Urinary tract infection, site not specified: Secondary | ICD-10-CM | POA: Diagnosis present

## 2012-07-23 DIAGNOSIS — I10 Essential (primary) hypertension: Secondary | ICD-10-CM | POA: Diagnosis present

## 2012-07-23 DIAGNOSIS — W010XXA Fall on same level from slipping, tripping and stumbling without subsequent striking against object, initial encounter: Secondary | ICD-10-CM | POA: Diagnosis present

## 2012-07-23 DIAGNOSIS — R5381 Other malaise: Secondary | ICD-10-CM | POA: Diagnosis present

## 2012-07-23 DIAGNOSIS — E785 Hyperlipidemia, unspecified: Secondary | ICD-10-CM | POA: Diagnosis present

## 2012-07-23 DIAGNOSIS — Y92009 Unspecified place in unspecified non-institutional (private) residence as the place of occurrence of the external cause: Secondary | ICD-10-CM

## 2012-07-23 DIAGNOSIS — D62 Acute posthemorrhagic anemia: Secondary | ICD-10-CM | POA: Diagnosis not present

## 2012-07-23 DIAGNOSIS — F3289 Other specified depressive episodes: Secondary | ICD-10-CM | POA: Diagnosis present

## 2012-07-23 DIAGNOSIS — I69959 Hemiplegia and hemiparesis following unspecified cerebrovascular disease affecting unspecified side: Secondary | ICD-10-CM

## 2012-07-23 DIAGNOSIS — Z9181 History of falling: Secondary | ICD-10-CM

## 2012-07-23 DIAGNOSIS — E213 Hyperparathyroidism, unspecified: Secondary | ICD-10-CM | POA: Diagnosis present

## 2012-07-23 DIAGNOSIS — F1410 Cocaine abuse, uncomplicated: Secondary | ICD-10-CM | POA: Diagnosis present

## 2012-07-23 DIAGNOSIS — R748 Abnormal levels of other serum enzymes: Secondary | ICD-10-CM

## 2012-07-23 DIAGNOSIS — M129 Arthropathy, unspecified: Secondary | ICD-10-CM | POA: Diagnosis present

## 2012-07-23 DIAGNOSIS — R531 Weakness: Secondary | ICD-10-CM

## 2012-07-23 DIAGNOSIS — I129 Hypertensive chronic kidney disease with stage 1 through stage 4 chronic kidney disease, or unspecified chronic kidney disease: Secondary | ICD-10-CM | POA: Diagnosis present

## 2012-07-23 DIAGNOSIS — S72001D Fracture of unspecified part of neck of right femur, subsequent encounter for closed fracture with routine healing: Secondary | ICD-10-CM

## 2012-07-23 DIAGNOSIS — I4510 Unspecified right bundle-branch block: Secondary | ICD-10-CM | POA: Diagnosis present

## 2012-07-23 DIAGNOSIS — S72033A Displaced midcervical fracture of unspecified femur, initial encounter for closed fracture: Principal | ICD-10-CM | POA: Diagnosis present

## 2012-07-23 DIAGNOSIS — Z859 Personal history of malignant neoplasm, unspecified: Secondary | ICD-10-CM

## 2012-07-23 DIAGNOSIS — D509 Iron deficiency anemia, unspecified: Secondary | ICD-10-CM | POA: Diagnosis present

## 2012-07-23 DIAGNOSIS — S72001A Fracture of unspecified part of neck of right femur, initial encounter for closed fracture: Secondary | ICD-10-CM

## 2012-07-23 DIAGNOSIS — F329 Major depressive disorder, single episode, unspecified: Secondary | ICD-10-CM | POA: Diagnosis present

## 2012-07-23 DIAGNOSIS — N183 Chronic kidney disease, stage 3 unspecified: Secondary | ICD-10-CM | POA: Diagnosis present

## 2012-07-23 DIAGNOSIS — G40909 Epilepsy, unspecified, not intractable, without status epilepticus: Secondary | ICD-10-CM | POA: Diagnosis present

## 2012-07-23 LAB — URINALYSIS, ROUTINE W REFLEX MICROSCOPIC
Glucose, UA: NEGATIVE mg/dL
Hgb urine dipstick: NEGATIVE
Ketones, ur: NEGATIVE mg/dL
Nitrite: NEGATIVE
Protein, ur: >300 mg/dL — AB
Specific Gravity, Urine: 1.029 (ref 1.005–1.030)
Urobilinogen, UA: 1 mg/dL (ref 0.0–1.0)
pH: 5 (ref 5.0–8.0)

## 2012-07-23 LAB — BASIC METABOLIC PANEL
BUN: 26 mg/dL — ABNORMAL HIGH (ref 6–23)
CO2: 23 mEq/L (ref 19–32)
Calcium: 11 mg/dL — ABNORMAL HIGH (ref 8.4–10.5)
Chloride: 99 mEq/L (ref 96–112)
Creatinine, Ser: 0.98 mg/dL (ref 0.50–1.10)
GFR calc Af Amer: 68 mL/min — ABNORMAL LOW (ref >90)
GFR calc non Af Amer: 58 mL/min — ABNORMAL LOW (ref >90)
Glucose, Bld: 106 mg/dL — ABNORMAL HIGH (ref 70–99)
Potassium: 3.9 mEq/L (ref 3.5–5.1)
Sodium: 138 mEq/L (ref 135–145)

## 2012-07-23 LAB — CBC WITH DIFFERENTIAL/PLATELET
Basophils Absolute: 0 10*3/uL (ref 0.0–0.1)
Basophils Relative: 0 % (ref 0–1)
Eosinophils Absolute: 0 10*3/uL (ref 0.0–0.7)
Eosinophils Relative: 0 % (ref 0–5)
HCT: 36.5 % (ref 36.0–46.0)
Hemoglobin: 12.9 g/dL (ref 12.0–15.0)
Lymphocytes Relative: 9 % — ABNORMAL LOW (ref 12–46)
Lymphs Abs: 0.7 10*3/uL (ref 0.7–4.0)
MCH: 31.2 pg (ref 26.0–34.0)
MCHC: 35.3 g/dL (ref 30.0–36.0)
MCV: 88.4 fL (ref 78.0–100.0)
Monocytes Absolute: 0.6 10*3/uL (ref 0.1–1.0)
Monocytes Relative: 8 % (ref 3–12)
Neutro Abs: 6.4 10*3/uL (ref 1.7–7.7)
Neutrophils Relative %: 83 % — ABNORMAL HIGH (ref 43–77)
Platelets: 215 10*3/uL (ref 150–400)
RBC: 4.13 MIL/uL (ref 3.87–5.11)
RDW: 12.5 % (ref 11.5–15.5)
WBC: 7.7 10*3/uL (ref 4.0–10.5)

## 2012-07-23 LAB — URINE MICROSCOPIC-ADD ON

## 2012-07-23 LAB — TROPONIN I: Troponin I: <0.3 ng/mL (ref <0.30)

## 2012-07-23 LAB — CK
Total CK: 1858 U/L — ABNORMAL HIGH (ref 7–177)
Total CK: 1660 U/L — ABNORMAL HIGH (ref 7–177)

## 2012-07-23 LAB — RAPID URINE DRUG SCREEN, HOSP PERFORMED
Amphetamines: NOT DETECTED
Benzodiazepines: NOT DETECTED
Opiates: NOT DETECTED
Tetrahydrocannabinol: NOT DETECTED

## 2012-07-23 MED ORDER — ENOXAPARIN SODIUM 40 MG/0.4ML ~~LOC~~ SOLN
40.0000 mg | SUBCUTANEOUS | Status: DC
  Administered 2012-07-23: 40 mg via SUBCUTANEOUS
  Filled 2012-07-23 (×3): qty 0.4

## 2012-07-23 MED ORDER — CLONIDINE HCL 0.2 MG PO TABS
0.2000 mg | ORAL_TABLET | Freq: Three times a day (TID) | ORAL | Status: DC
  Administered 2012-07-23 – 2012-07-27 (×9): 0.2 mg via ORAL
  Filled 2012-07-23 (×15): qty 1

## 2012-07-23 MED ORDER — LISINOPRIL 20 MG PO TABS
20.0000 mg | ORAL_TABLET | Freq: Every day | ORAL | Status: DC
  Administered 2012-07-24 – 2012-07-27 (×4): 20 mg via ORAL
  Filled 2012-07-23 (×4): qty 1

## 2012-07-23 MED ORDER — DEXTROSE 5 % IV SOLN
1.0000 g | Freq: Once | INTRAVENOUS | Status: AC
  Administered 2012-07-23: 1 g via INTRAVENOUS
  Filled 2012-07-23: qty 10

## 2012-07-23 MED ORDER — SODIUM CHLORIDE 0.9 % IV SOLN
INTRAVENOUS | Status: DC
  Administered 2012-07-24 – 2012-07-26 (×3): via INTRAVENOUS

## 2012-07-23 MED ORDER — AMLODIPINE BESYLATE 10 MG PO TABS
10.0000 mg | ORAL_TABLET | Freq: Once | ORAL | Status: AC
  Administered 2012-07-23: 10 mg via ORAL
  Filled 2012-07-23: qty 1

## 2012-07-23 MED ORDER — AMLODIPINE BESYLATE 10 MG PO TABS
10.0000 mg | ORAL_TABLET | Freq: Every day | ORAL | Status: DC
  Administered 2012-07-24 – 2012-07-27 (×4): 10 mg via ORAL
  Filled 2012-07-23 (×4): qty 1

## 2012-07-23 MED ORDER — DEXTROSE 5 % IV SOLN
1.0000 g | INTRAVENOUS | Status: DC
  Administered 2012-07-24: 1 g via INTRAVENOUS
  Filled 2012-07-23 (×2): qty 10

## 2012-07-23 MED ORDER — LISINOPRIL 20 MG PO TABS
20.0000 mg | ORAL_TABLET | Freq: Once | ORAL | Status: AC
  Administered 2012-07-23: 20 mg via ORAL
  Filled 2012-07-23: qty 1

## 2012-07-23 MED ORDER — SODIUM CHLORIDE 0.9 % IV BOLUS (SEPSIS)
1000.0000 mL | Freq: Once | INTRAVENOUS | Status: AC
  Administered 2012-07-23: 1000 mL via INTRAVENOUS

## 2012-07-23 NOTE — ED Notes (Signed)
Contact information for  Patient.- wilma bryant(sister)  [phone].

## 2012-07-23 NOTE — ED Notes (Signed)
Results of lactic acid shown to attending PA-C Thayer Ohm

## 2012-07-23 NOTE — Progress Notes (Signed)
Pt was brought up from the ED via stretcher. Pt is oriented to self and person only. Pt is disoriented to time and place. Pt was put in bed, cleaned up, assessed, and VS were taken. Pt watched the safety video. Pt is in no apparent distress. RN will continue to monitor pt. Pt was put on tele.

## 2012-07-23 NOTE — H&P (Signed)
Hospital Admission Note Date: 07/23/2012  Patient name: [name] Medical record number: [id number] Date of birth: Feb 27, 1946 Age: 67 y.o. Gender: female PCP: Johnette Abraham, DO  Medical Service: [facility]  Attending physician: Dr. Criselda Peaches   1st Contact: Dr. Heloise Beecham   Pager:[phone] 2nd Contact: Dr. Everardo Beals   Pager:[phone] After 5 pm or weekends: 1st Contact:  Intern on call   Pager: [phone] 2nd Contact:  Resident on call  Pager: [phone]  Chief Complaint: Found down  History of Present Illness: Patient is a 67 year old female past medical history of hypertension, metastatic carcinoid cancer status post bowel resection, depression, hyperlipidemia, CVA, seizure, stage II chronic kidney disease who presents after being found down today by her sister. Patient is currently living in an independent living facility. She alternately reports that she has an aide it comes to assist her daily and other times tells [place] that somebody only comes once per week. She uses a walker at baseline to ambulate. Apparently, sister found the patient lying down on the floor of her apartment this morning. The sister reports the patient has become weaker and is having much difficult time taking care of herself. Per the patient, she decided to sleep on the floor last night for reasons she cannot recall. She reports this morning that her left foot was swollen tender but has gotten better. She says that she has somebody that helps her cook and bathe, but is confused about how often she sees this person during the week.  Patient complains of right ankle and foot pain. She denies any previous injury and is not sure why she is having the pain. She denies any fever, chills, chest pain, dizziness, shortness of breath, cough, abdominal pain, nausea, vomiting, diarrhea, dysuria.   Meds: Current Outpatient Rx  Name  Route  Sig  Dispense  Refill  . amLODipine (NORVASC) 10 MG tablet   Oral   Take 1 tablet (10 mg  total) by mouth daily.   30 tablet   5   . cloNIDine (CATAPRES) 0.2 MG tablet   Oral   Take 1 tablet (0.2 mg total) by mouth 3 (three) times daily.   90 tablet   5   . lisinopril (PRINIVIL,ZESTRIL) 20 MG tablet   Oral   Take 1 tablet (20 mg total) by mouth daily.   30 tablet   5   . Multiple Vitamins-Iron (MULTIVITAMIN/IRON) TABS   Oral   Take 1 tablet by mouth daily.          . simvastatin (ZOCOR) 20 MG tablet      TAKE 1 TABLET BY MOUTH EVERY NIGHT AT BEDTIME   30 tablet   4     Allergies: Allergies as of 07/23/2012  . (No Known Allergies)   Past Medical History  Diagnosis Date  . 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 Myna Hidalgo w/sandostatin injections q monthly  . Anemia     iron deficiency  . Depression   . Hyperlipidemia   . Hypertension   . History of cocaine abuse     quit 03/06; seizure and HTN urgency secondary to use 12/04  . Seizures   . 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 Weight loss  15 lbs 08/07 (regained 104 08/07 and 112 lbs 12/07)  . Arthritis   . Halitosis     per notes  . Herpes zoster of eye 04/09    right eye  . Chronic kidney disease, stage III (moderate)     BL Cr 1.2-1.3   History reviewed. No pertinent past surgical history. Family History  Problem Relation Age of Onset  . Cancer Mother   . Cancer Father   . Diabetes Brother    History   Social History  . Marital Status: Single    Spouse Name: N/A    Number of Children: N/A  . Years of Education: N/A   Occupational History  . Not on file.   Social History Main Topics  . Smoking status: Never Smoker   . Smokeless tobacco: Not on file  . Alcohol Use: No  . Drug Use: No     Comment:  used cocaine once, had stroke  . Sexually Active: Not on file   Other Topics Concern  . Not on file   Social History Narrative  . No narrative on file    Review of Systems: 10 pt ROS performed, pertinent positives and negatives noted in HPI  Physical Exam: Blood pressure 173/61, pulse 70, temperature 97.1 F (36.2 C), temperature source Oral, resp. rate 20, SpO2 100.00%. Vitals reviewed. General: Disheveled appearing female, lying in bed, gown smells strongly of urine HEENT: PERRL, EOMI, no scleral icterus Cardiac: RRR, no rubs, murmurs or gallops Pulm: clear to auscultation bilaterally, no wheezes, rales, or rhonchi Abd: Surgical scars noted. Soft, nontender, nondistended, BS present Ext: Has nonpitting edema of right foot. Full range of motion of right ankle and toes. Has pain with right knee flexion and hip flexion. No joint effusions noted. Neuro: Patient is tangential and easily distractible, cranial nerves II-XII grossly intact, strength and sensation to light touch equal in bilateral upper and lower extremities   Lab results: Basic Metabolic Panel:  Recent Labs  45/40/98 1035  NA 138  K 3.9  CL 99  CO2 23  GLUCOSE 106*  BUN 26*  CREATININE 0.98  CALCIUM 11.0*   CBC:  Recent Labs  07/23/12 1035  WBC 7.7  NEUTROABS 6.4  HGB 12.9  HCT 36.5  MCV 88.4  PLT 215   Cardiac Enzymes:  Recent Labs  07/23/12 1035 07/23/12 1511  CKTOTAL 1858* 1660*   Urine Drug Screen: Drugs of Abuse     Component Value Date/Time   LABOPIA NONE DETECTED 07/23/2012 1317   COCAINSCRNUR NONE DETECTED 07/23/2012 1317   COCAINSCRNUR NEG 03/04/2008 1950   LABBENZ NONE DETECTED 07/23/2012 1317   LABBENZ NEG 03/04/2008 1950   AMPHETMU NONE DETECTED 07/23/2012 1317   AMPHETMU NEG 03/04/2008 1950   THCU NONE DETECTED 07/23/2012 1317   LABBARB NONE DETECTED 07/23/2012 1317    Urinalysis:  Recent Labs  07/23/12 1317  COLORURINE YELLOW  LABSPEC 1.029  PHURINE 5.0  GLUCOSEU NEGATIVE   HGBUR NEGATIVE  BILIRUBINUR SMALL*  KETONESUR NEGATIVE  PROTEINUR >300*  UROBILINOGEN 1.0  NITRITE NEGATIVE  LEUKOCYTESUR MODERATE*    Imaging results:  Mr Brain Wo Contrast  07/23/2012  *RADIOLOGY REPORT*  Clinical Data: Right facial droop and extremity weakness.  Slurred speech.  MRI HEAD WITHOUT CONTRAST  Technique:  Multiplanar, multiecho pulse sequences of the brain and surrounding structures were obtained according to standard protocol without intravenous contrast.  Comparison: CT head without contrast 05/11/2003.  Findings: A remote hemorrhagic infarcts of the left putamen is  evident.  Diffuse remote white matter infarction is also noted on the left.  No acute infarct or hemorrhage is evident.  Moderate generalized atrophy and white matter disease is present bilaterally. Additional remote lacunar infarction noted in the basal ganglia, more prominent left than right thalami.  Wallerian degeneration is evident within the left cerebral peduncle.  Remote lacunar infarcts are present in the cerebellum bilaterally, worse on the left.  Flow is present in the major intracranial arteries.  The globes and orbits are intact.  The paranasal sinuses and mastoid air cells are clear.  IMPRESSION:  1.  No acute intracranial abnormality. 2.  Remote hemorrhagic infarct of the left basal ganglia with associated wallerian degeneration. 3.  Age advanced atrophy and extensive white matter disease likely reflects the sequelae of chronic microvascular ischemia.   Original Report Authenticated By: Marin Roberts, M.D.     Other results: EKG: Sinus rhythm with rate of 76, wavering right bundle branch block. baseline makes interpretation difficult. Normal axis. QTC slightly prolonged at 484. No ST segment elevation. No prior EKG for comparison.  Assessment & Plan by Problem: 67 yo female presentes after being found  1) Falls Patient with concern for falls at home, and found down by sister this morning. Patient  says she chose to sleep on the floor, but is a poor historian surrounding the events of what occurred prior to last night. Her CK is mildly elevated at 1600. She has a normal lactic acid. In terms of the etiology of her falls, it is not completely clear at this time. Falls could be a result of chronic progressive deconditioning. Unfortunately, as patient was alone, the rate of her decline is unclear.  Infection could be contributing to fall risk she. She is not febrile nor does she have leukocytosis but she does have 21-50 white blood cells per high power field in her urine. Reassuringly, MRI of the brain was negative for acute infarct. She does have a history of seizure disorder and has multiple possible foci of seizure activity from her prior strokes; however there is no history support this as the patient was alone at the time.  Hypercalcemia may be contributing, with Ca 11.0 on admission and elevation in past.  Arrhythmia or MI is also a possibility. She denies chest pain or shortness of breath she does have a right bundle branch block on EKG, no prior for comparison. Prior fall injuries may also be responsible for unsteadiness on her feet if chronic pain is contributing. She does ambulate with a walker at baseline.  - PT/OT. Expect patient will need SNF placement for closer supervision.  - Plain films R hip, foot - Continue ceftriaxone started in the ED for UTI. Follow cultures  - Cycle cardiac enzymes overnight - IVF NS 125 cc/hr  2) HTN, uncontrolled Patient appears to have isolated systolic hypertension with a blood pressure 216/84 in the ED, which decreased to 173/61 after she was given her home medications. She is supposed to be taking lisinopril, clonidine, amlodipine. If she's not taking medications as prescribed, rebound hypertension from clonidine is certainly possible. -Continue home antihypertensives - Clonidine will likely need to be substituted for alternative if patient does not  go to skilled nursing facility  3) Hypercalcemia, possible hyperparathyroidism Noted to be hypercalcemic in the past. Her calcium today is 11.0.  Parathyroid hormone testing done in September 2013 revealed elevated PTH 126.  At that time, further workup with consideration of MEN syndrome was recommended given her history of  carcinoid tumor. PET-CT was recommended but not completed.  4) Metastatic carcinoid tumor s/p small bowel resection Follows w Dr. Myna Hidalgo. On sandostatin monthly. Her most recent notes, her chromogranin A. level has been increasing, which is concerning. Her oncologist, repeat CT scan may be needed. - Will touch base with oncologist, Dr. Myna Hidalgo, tomorrow  5) HLD Hold statin given mild CK elevation 1600.  Dispo: Disposition is deferred at this time, awaiting improvement of current medical problems. Anticipated discharge in approximately 3-4 day(s).   The patient does have a current PCP (KALIA-REYNOLDS, SHELLY, DO), therefore will be requiring OPC follow-up after discharge.     Signed: Bronson Curb 07/23/2012, 6:28 PM

## 2012-07-23 NOTE — ED Provider Notes (Signed)
History     CSN: [id number]  Arrival date & time 07/23/12  7829   First MD Initiated Contact with Patient 07/23/12 1009      Chief Complaint  Patient presents with  . Facial Droop  . Extremity Weakness  . Back Pain    (Consider location/radiation/quality/duration/timing/severity/associated sxs/prior treatment) HPI Patient presents emergency department with questionable facial weakness, and droop.  Patient was found by her sister today and was sitting on the floor.  Patient, states she did not fall, but center self down on the floor.  Sister, states, that she's having more and more difficulty caring for the patient.  Patient denies chest pain, shortness of breath, nausea, vomiting, abdominal pain, headache, blurred vision, syncope, dizziness, diarrhea, rash or neck pain.  Patient, states she's had residual weakness from her strokes in the past.  The patient, states, that she's not having any urinary symptoms.  The patient has been living in an independent living facility.  Patient uses a walker due to the residual weakness from her strokes. Past Medical History  Diagnosis Date  . 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 Myna Hidalgo w/sandostatin injections q monthly  . Anemia     iron deficiency  . Depression   . Hyperlipidemia   . Hypertension   . History of cocaine abuse     quit 03/06; seizure and HTN urgency secondary to use 12/04  . Seizures   . 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 Weight loss     15 lbs 08/07 (regained 104 08/07 and 112 lbs 12/07)  . Arthritis   . Halitosis     per notes  . Herpes zoster of eye 04/09    right eye  . Chronic kidney disease, stage III (moderate)     BL Cr 1.2-1.3     History reviewed. No pertinent past surgical history.  Family History  Problem Relation Age of Onset  . Cancer Mother   . Cancer Father   . Diabetes Brother     History  Substance Use Topics  . Smoking status: Never Smoker   . Smokeless tobacco: Not on file  . Alcohol Use: No    OB History   Grav Para Term Preterm Abortions TAB SAB Ect Mult Living                  Review of Systems All other systems negative except as documented in the HPI. All pertinent positives and negatives as reviewed in the HPI. Allergies  Review of patient's allergies indicates no known allergies.  Home Medications   Current Outpatient Rx  Name  Route  Sig  Dispense  Refill  . amLODipine (NORVASC) 10 MG tablet   Oral   Take 1 tablet (10 mg total) by mouth daily.   30 tablet   5   . cloNIDine (CATAPRES) 0.2 MG tablet   Oral   Take 1 tablet (0.2 mg total) by mouth 3 (three) times daily.   90 tablet   5   . lisinopril (PRINIVIL,ZESTRIL) 20 MG tablet   Oral   Take 1 tablet (20 mg total) by mouth daily.   30 tablet   5   . Multiple Vitamins-Iron (MULTIVITAMIN/IRON) TABS   Oral  Take 1 tablet by mouth daily.          . simvastatin (ZOCOR) 20 MG tablet      TAKE 1 TABLET BY MOUTH EVERY NIGHT AT BEDTIME   30 tablet   4     BP 173/61  Pulse 70  Temp(Src) 97.1 F (36.2 C) (Oral)  Resp 20  SpO2 100%  Physical Exam  Constitutional: She is oriented to person, place, and time. She appears well-developed and well-nourished. No distress.  HENT:  Head: Normocephalic and atraumatic.  Mouth/Throat: Oropharynx is clear and moist.  Eyes: Pupils are equal, round, and reactive to light.  Neck: Normal range of motion. Neck supple.  Cardiovascular: Normal rate, regular rhythm and normal heart sounds.  Exam reveals no gallop and no friction rub.   No murmur heard. Pulmonary/Chest: Effort normal and breath sounds normal. No respiratory distress. She has no wheezes. She has no rales.   Abdominal: Soft. Bowel sounds are normal. She exhibits no distension. There is no tenderness. There is no rebound and no guarding.  Neurological: She is alert and oriented to person, place, and time. No sensory deficit. She exhibits normal muscle tone. Coordination normal. GCS eye subscore is 4. GCS verbal subscore is 5. GCS motor subscore is 6.  Patient has some weakness noted in her right leg, but this is chronic.  Patient has good bilateral grip strength.    ED Course  Procedures (including critical care time)  Labs Reviewed  CBC WITH DIFFERENTIAL - Abnormal; Notable for the following:    Neutrophils Relative 83 (*)    Lymphocytes Relative 9 (*)    All other components within normal limits  BASIC METABOLIC PANEL - Abnormal; Notable for the following:    Glucose, Bld 106 (*)    BUN 26 (*)    Calcium 11.0 (*)    GFR calc non Af Amer 58 (*)    GFR calc Af Amer 68 (*)    All other components within normal limits  URINALYSIS, ROUTINE W REFLEX MICROSCOPIC - Abnormal; Notable for the following:    APPearance HAZY (*)    Bilirubin Urine SMALL (*)    Protein, ur >300 (*)    Leukocytes, UA MODERATE (*)    All other components within normal limits  CK - Abnormal; Notable for the following:    Total CK 1858 (*)    All other components within normal limits  URINE MICROSCOPIC-ADD ON - Abnormal; Notable for the following:    Squamous Epithelial / LPF FEW (*)    Bacteria, UA FEW (*)    Casts HYALINE CASTS (*)    All other components within normal limits  CK - Abnormal; Notable for the following:    Total CK 1660 (*)    All other components within normal limits  URINE CULTURE  CG4 I-STAT (LACTIC ACID)   Mr Brain Wo Contrast  07/23/2012  *RADIOLOGY REPORT*  Clinical Data: Right facial droop and extremity weakness.  Slurred speech.  MRI HEAD WITHOUT CONTRAST  Technique:  Multiplanar, multiecho pulse sequences of the brain and surrounding structures were obtained according to standard  protocol without intravenous contrast.  Comparison: CT head without contrast 05/11/2003.  Findings: A remote hemorrhagic infarcts of the left putamen is evident.  Diffuse remote white matter infarction is also noted on the left.  No acute infarct or hemorrhage is evident.  Moderate generalized atrophy and white matter disease is present bilaterally. Additional remote lacunar infarction noted in the basal ganglia, more  prominent left than right thalami.  Wallerian degeneration is evident within the left cerebral peduncle.  Remote lacunar infarcts are present in the cerebellum bilaterally, worse on the left.  Flow is present in the major intracranial arteries.  The globes and orbits are intact.  The paranasal sinuses and mastoid air cells are clear.  IMPRESSION:  1.  No acute intracranial abnormality. 2.  Remote hemorrhagic infarct of the left basal ganglia with associated wallerian degeneration. 3.  Age advanced atrophy and extensive white matter disease likely reflects the sequelae of chronic microvascular ischemia.   Original Report Authenticated By: Marin Roberts, M.D.    Patient will be admitted for weakness, and elevated.  CK.  Patient did have urinary tract infection, and we'll treat with 1 g Rocephin.  Patient does not appear septic and does not appear to be having any CVA symptoms and MRI is negative for acute infarct.  Patient be admitted by the internal medicine residents   MDM  MDM Reviewed: nursing note, vitals and previous chart Reviewed previous: labs Interpretation: labs, MRI and x-ray Consults: admitting MD            Carlyle Dolly, PA-C 07/23/12 1659

## 2012-07-23 NOTE — ED Notes (Signed)
Per EMS, patient was found on floor today at her [facility] home room.  Patient resides at [facility].   Patient does have slurred speech.  Patient has R sided facial droop.  R sided leg weakness.  Patient claims that the leg weakness is new, but then said she has some trouble with it since previous CVA.  Patient poor historian.   Patient cannot recall medical problems.  EMS did not know what baseline was for patient.

## 2012-07-23 NOTE — ED Notes (Signed)
Pt in ray department.

## 2012-07-23 NOTE — ED Notes (Signed)
Pt turned cleaned and linen changed.

## 2012-07-24 ENCOUNTER — Encounter (HOSPITAL_COMMUNITY): Payer: Self-pay | Admitting: Anesthesiology

## 2012-07-24 ENCOUNTER — Inpatient Hospital Stay (HOSPITAL_COMMUNITY): Payer: Medicare HMO | Admitting: Anesthesiology

## 2012-07-24 ENCOUNTER — Encounter (HOSPITAL_COMMUNITY): Payer: Self-pay | Admitting: *Deleted

## 2012-07-24 ENCOUNTER — Inpatient Hospital Stay (HOSPITAL_COMMUNITY): Payer: Medicare HMO

## 2012-07-24 ENCOUNTER — Encounter (HOSPITAL_COMMUNITY)
Admission: EM | Disposition: A | Discharge status: Skilled Nursing Facility | Payer: Self-pay | Source: Home / Self Care | Attending: Internal Medicine

## 2012-07-24 DIAGNOSIS — R5381 Other malaise: Secondary | ICD-10-CM

## 2012-07-24 DIAGNOSIS — R5383 Other fatigue: Secondary | ICD-10-CM

## 2012-07-24 HISTORY — PX: HIP ARTHROPLASTY: SHX981

## 2012-07-24 LAB — BASIC METABOLIC PANEL
BUN: 30 mg/dL — ABNORMAL HIGH (ref 6–23)
CO2: 27 mEq/L (ref 19–32)
Calcium: 9.7 mg/dL (ref 8.4–10.5)
Creatinine, Ser: 1.05 mg/dL (ref 0.50–1.10)
Glucose, Bld: 107 mg/dL — ABNORMAL HIGH (ref 70–99)
Sodium: 146 mEq/L — ABNORMAL HIGH (ref 135–145)

## 2012-07-24 LAB — CREATININE, SERUM
Creatinine, Ser: 0.99 mg/dL (ref 0.50–1.10)
GFR calc Af Amer: 67 mL/min — ABNORMAL LOW (ref >90)
GFR calc non Af Amer: 58 mL/min — ABNORMAL LOW (ref >90)

## 2012-07-24 LAB — PROTIME-INR: Prothrombin Time: 12.9 seconds (ref 11.6–15.2)

## 2012-07-24 LAB — TSH: TSH: 1.833 u[IU]/mL (ref 0.350–4.500)

## 2012-07-24 LAB — URINE CULTURE: Colony Count: 9000

## 2012-07-24 LAB — CBC
Hemoglobin: 10.3 g/dL — ABNORMAL LOW (ref 12.0–15.0)
MCHC: 34.1 g/dL (ref 30.0–36.0)
RBC: 3.36 MIL/uL — ABNORMAL LOW (ref 3.87–5.11)
WBC: 9.1 10*3/uL (ref 4.0–10.5)

## 2012-07-24 LAB — SURGICAL PCR SCREEN: MRSA, PCR: NEGATIVE

## 2012-07-24 SURGERY — HEMIARTHROPLASTY, HIP, DIRECT ANTERIOR APPROACH, FOR FRACTURE
Anesthesia: General | Site: Hip | Laterality: Right | Wound class: Clean

## 2012-07-24 MED ORDER — SODIUM CHLORIDE 0.9 % IV SOLN: INTRAVENOUS | Status: DC

## 2012-07-24 MED ORDER — HYDROCODONE-ACETAMINOPHEN 5-325 MG PO TABS
1.0000 | ORAL_TABLET | Freq: Four times a day (QID) | ORAL | Status: DC | PRN
  Administered 2012-07-26 – 2012-07-27 (×4): 2 via ORAL
  Filled 2012-07-24 (×3): qty 2

## 2012-07-24 MED ORDER — CEFAZOLIN SODIUM-DEXTROSE 2-3 GM-% IV SOLR: 2.0000 g | Freq: Four times a day (QID) | INTRAVENOUS | Status: DC

## 2012-07-24 MED ORDER — HYDROMORPHONE HCL PF 1 MG/ML IJ SOLN: 0.2500 mg | INTRAMUSCULAR | Status: DC | PRN

## 2012-07-24 MED ORDER — ONDANSETRON HCL 4 MG/2ML IJ SOLN: 4.0000 mg | Freq: Four times a day (QID) | INTRAMUSCULAR | Status: DC | PRN

## 2012-07-24 MED ORDER — ONDANSETRON HCL 4 MG PO TABS: 4.0000 mg | ORAL_TABLET | Freq: Four times a day (QID) | ORAL | Status: DC | PRN

## 2012-07-24 MED ORDER — ENOXAPARIN SODIUM 30 MG/0.3ML ~~LOC~~ SOLN: 30.0000 mg | SUBCUTANEOUS | Status: DC

## 2012-07-24 MED ORDER — LACTATED RINGERS IV SOLN
INTRAVENOUS | Status: DC | PRN
  Administered 2012-07-24: 15:00:00 via INTRAVENOUS

## 2012-07-24 MED ORDER — LACTATED RINGERS IV SOLN
INTRAVENOUS | Status: DC
  Administered 2012-07-24: 15:00:00 via INTRAVENOUS

## 2012-07-24 MED ORDER — GLYCOPYRROLATE 0.2 MG/ML IJ SOLN
INTRAMUSCULAR | Status: DC | PRN
  Administered 2012-07-24: 0.4 mg via INTRAVENOUS

## 2012-07-24 MED ORDER — CEFAZOLIN SODIUM-DEXTROSE 2-3 GM-% IV SOLR
2.0000 g | INTRAVENOUS | Status: AC
  Administered 2012-07-24: 2 g via INTRAVENOUS
  Filled 2012-07-24: qty 50

## 2012-07-24 MED ORDER — ROCURONIUM BROMIDE 100 MG/10ML IV SOLN
INTRAVENOUS | Status: DC | PRN
  Administered 2012-07-24: 30 mg via INTRAVENOUS

## 2012-07-24 MED ORDER — ENOXAPARIN SODIUM 30 MG/0.3ML ~~LOC~~ SOLN
30.0000 mg | SUBCUTANEOUS | Status: DC
  Administered 2012-07-25: 09:00:00 via SUBCUTANEOUS
  Administered 2012-07-26 – 2012-07-27 (×2): 30 mg via SUBCUTANEOUS
  Filled 2012-07-24 (×4): qty 0.3

## 2012-07-24 MED ORDER — ACETAMINOPHEN 325 MG PO TABS: 650.0000 mg | ORAL_TABLET | Freq: Four times a day (QID) | ORAL | Status: DC | PRN

## 2012-07-24 MED ORDER — SODIUM CHLORIDE 0.9 % IV SOLN
10.0000 mg | INTRAVENOUS | Status: DC | PRN
  Administered 2012-07-24: 20 ug/min via INTRAVENOUS
  Administered 2012-07-24: 40 ug/min via INTRAVENOUS

## 2012-07-24 MED ORDER — FENTANYL CITRATE 0.05 MG/ML IJ SOLN
INTRAMUSCULAR | Status: DC | PRN
  Administered 2012-07-24: 100 ug via INTRAVENOUS
  Administered 2012-07-24 (×2): 50 ug via INTRAVENOUS

## 2012-07-24 MED ORDER — MORPHINE SULFATE 2 MG/ML IJ SOLN: 0.5000 mg | INTRAMUSCULAR | Status: DC | PRN

## 2012-07-24 MED ORDER — OXYCODONE HCL 5 MG PO TABS: 5.0000 mg | ORAL_TABLET | Freq: Once | ORAL | Status: DC | PRN

## 2012-07-24 MED ORDER — LIDOCAINE HCL (CARDIAC) 20 MG/ML IV SOLN
INTRAVENOUS | Status: DC | PRN
  Administered 2012-07-24: 70 mg via INTRAVENOUS

## 2012-07-24 MED ORDER — MUPIROCIN 2 % EX OINT
1.0000 "application " | TOPICAL_OINTMENT | Freq: Two times a day (BID) | CUTANEOUS | Status: DC
  Administered 2012-07-24 – 2012-07-27 (×5): 1 via NASAL
  Filled 2012-07-24 (×2): qty 22

## 2012-07-24 MED ORDER — ONDANSETRON HCL 4 MG/2ML IJ SOLN
INTRAMUSCULAR | Status: DC | PRN
  Administered 2012-07-24: 4 mg via INTRAVENOUS

## 2012-07-24 MED ORDER — NEOSTIGMINE METHYLSULFATE 1 MG/ML IJ SOLN
INTRAMUSCULAR | Status: DC | PRN
  Administered 2012-07-24: 3 mg via INTRAVENOUS

## 2012-07-24 MED ORDER — MENTHOL 3 MG MT LOZG: 1.0000 | LOZENGE | OROMUCOSAL | Status: DC | PRN

## 2012-07-24 MED ORDER — ACETAMINOPHEN 650 MG RE SUPP: 650.0000 mg | Freq: Four times a day (QID) | RECTAL | Status: DC | PRN

## 2012-07-24 MED ORDER — SODIUM CHLORIDE 0.9 % IR SOLN
Status: DC | PRN
  Administered 2012-07-24: 1000 mL

## 2012-07-24 MED ORDER — OXYCODONE HCL 5 MG/5ML PO SOLN: 5.0000 mg | Freq: Once | ORAL | Status: DC | PRN

## 2012-07-24 MED ORDER — CEFAZOLIN SODIUM-DEXTROSE 2-3 GM-% IV SOLR
INTRAVENOUS | Status: AC
  Filled 2012-07-24: qty 50

## 2012-07-24 MED ORDER — ENOXAPARIN SODIUM 30 MG/0.3ML ~~LOC~~ SOLN
30.0000 mg | SUBCUTANEOUS | Status: DC
  Filled 2012-07-24: qty 0.3

## 2012-07-24 MED ORDER — PHENOL 1.4 % MT LIQD: 1.0000 | OROMUCOSAL | Status: DC | PRN

## 2012-07-24 MED ORDER — ARTIFICIAL TEARS OP OINT
TOPICAL_OINTMENT | OPHTHALMIC | Status: DC | PRN
  Administered 2012-07-24: 1 via OPHTHALMIC

## 2012-07-24 MED ORDER — METOCLOPRAMIDE HCL 5 MG/ML IJ SOLN: 5.0000 mg | Freq: Three times a day (TID) | INTRAMUSCULAR | Status: DC | PRN

## 2012-07-24 MED ORDER — METOCLOPRAMIDE HCL 10 MG PO TABS: 5.0000 mg | ORAL_TABLET | Freq: Three times a day (TID) | ORAL | Status: DC | PRN

## 2012-07-24 MED ORDER — DEXTROSE 5 % IV SOLN
INTRAVENOUS | Status: DC | PRN
  Administered 2012-07-24: 16:00:00 via INTRAVENOUS

## 2012-07-24 MED ORDER — PROPOFOL 10 MG/ML IV BOLUS
INTRAVENOUS | Status: DC | PRN
  Administered 2012-07-24: 140 mg via INTRAVENOUS

## 2012-07-24 MED ORDER — CHLORHEXIDINE GLUCONATE 4 % EX LIQD
60.0000 mL | Freq: Once | CUTANEOUS | Status: DC
  Filled 2012-07-24: qty 60

## 2012-07-24 SURGICAL SUPPLY — 53 items
BLADE SAW SAG 73X25 THK (BLADE) ×1
BLADE SAW SGTL 73X25 THK (BLADE) ×1 IMPLANT
BRUSH FEMORAL CANAL (MISCELLANEOUS) IMPLANT
CLOTH BEACON ORANGE TIMEOUT ST (SAFETY) ×2 IMPLANT
COVER SURGICAL LIGHT HANDLE (MISCELLANEOUS) ×2 IMPLANT
DRAPE INCISE IOBAN 66X45 STRL (DRAPES) ×2 IMPLANT
DRAPE ORTHO SPLIT 77X108 STRL (DRAPES) ×3
DRAPE SURG ORHT 6 SPLT 77X108 (DRAPES) ×3 IMPLANT
DRAPE U-SHAPE 47X51 STRL (DRAPES) ×2 IMPLANT
DRILL BIT 7/64X5 (BIT) ×2 IMPLANT
DRSG ADAPTIC 3X8 NADH LF (GAUZE/BANDAGES/DRESSINGS) ×2 IMPLANT
DRSG MEPILEX BORDER 4X8 (GAUZE/BANDAGES/DRESSINGS) ×2 IMPLANT
DRSG PAD ABDOMINAL 8X10 ST (GAUZE/BANDAGES/DRESSINGS) ×2 IMPLANT
DURAPREP 26ML APPLICATOR (WOUND CARE) ×2 IMPLANT
ELECT BLADE 6.5 EXT (BLADE) ×2 IMPLANT
ELECT REM PT RETURN 9FT ADLT (ELECTROSURGICAL) ×2
ELECTRODE REM PT RTRN 9FT ADLT (ELECTROSURGICAL) ×1 IMPLANT
FACESHIELD LNG OPTICON STERILE (SAFETY) IMPLANT
GLOVE BIOGEL PI ORTHO PRO 7.5 (GLOVE) ×1
GLOVE BIOGEL PI ORTHO PRO SZ8 (GLOVE) ×1
GLOVE ORTHO TXT STRL SZ7.5 (GLOVE) ×2 IMPLANT
GLOVE PI ORTHO PRO STRL 7.5 (GLOVE) ×1 IMPLANT
GLOVE PI ORTHO PRO STRL SZ8 (GLOVE) ×1 IMPLANT
GLOVE SURG ORTHO 8.5 STRL (GLOVE) ×2 IMPLANT
GOWN STRL NON-REIN LRG LVL3 (GOWN DISPOSABLE) ×2 IMPLANT
GOWN STRL REIN XL XLG (GOWN DISPOSABLE) ×4 IMPLANT
HANDPIECE INTERPULSE COAX TIP (DISPOSABLE)
KIT BASIN OR (CUSTOM PROCEDURE TRAY) ×2 IMPLANT
KIT ROOM TURNOVER OR (KITS) ×2 IMPLANT
MANIFOLD NEPTUNE II (INSTRUMENTS) ×2 IMPLANT
NS IRRIG 1000ML POUR BTL (IV SOLUTION) ×2 IMPLANT
PACK TOTAL JOINT (CUSTOM PROCEDURE TRAY) ×2 IMPLANT
PAD ARMBOARD 7.5X6 YLW CONV (MISCELLANEOUS) ×4 IMPLANT
PASSER SUT SWANSON 36MM LOOP (INSTRUMENTS) ×2 IMPLANT
SET HNDPC FAN SPRY TIP SCT (DISPOSABLE) IMPLANT
SPONGE GAUZE 4X4 12PLY (GAUZE/BANDAGES/DRESSINGS) ×2 IMPLANT
SPONGE LAP 4X18 X RAY DECT (DISPOSABLE) IMPLANT
STAPLER VISISTAT 35W (STAPLE) ×2 IMPLANT
STRIP CLOSURE SKIN 1/2X4 (GAUZE/BANDAGES/DRESSINGS) ×4 IMPLANT
SUCTION FRAZIER TIP 10 FR DISP (SUCTIONS) IMPLANT
SUT ETHIBOND NAB CT1 #1 30IN (SUTURE) ×6 IMPLANT
SUT MNCRL AB 3-0 PS2 18 (SUTURE) ×2 IMPLANT
SUT VIC AB 0 CT1 27 (SUTURE) ×3
SUT VIC AB 0 CT1 27XBRD ANBCTR (SUTURE) ×3 IMPLANT
SUT VIC AB 1 CT1 27 (SUTURE) ×3
SUT VIC AB 1 CT1 27XBRD ANBCTR (SUTURE) ×3 IMPLANT
SUT VIC AB 2-0 CT1 27 (SUTURE) ×4
SUT VIC AB 2-0 CT1 TAPERPNT 27 (SUTURE) ×4 IMPLANT
TOWEL OR 17X24 6PK STRL BLUE (TOWEL DISPOSABLE) ×2 IMPLANT
TOWEL OR 17X26 10 PK STRL BLUE (TOWEL DISPOSABLE) ×2 IMPLANT
TOWER CARTRIDGE SMART MIX (DISPOSABLE) IMPLANT
TRAY FOLEY CATH 14FR (SET/KITS/TRAYS/PACK) ×2 IMPLANT
WATER STERILE IRR 1000ML POUR (IV SOLUTION) ×6 IMPLANT

## 2012-07-24 NOTE — Progress Notes (Signed)
INITIAL NUTRITION ASSESSMENT  DOCUMENTATION CODES Per approved criteria  -Underweight   INTERVENTION: 1. No nutrition interventions at this time. Encourage PO intake  NUTRITION DIAGNOSIS: Underweight  related to chronic disease as evidenced by Body mass index is 17.86 kg/(m^2). Marland Kitchen   Goal: PO intake to meet >/=90% estimated nutrition needs  Monitor:  PO intake, weight trends, labs, I/O's  Reason for Assessment: Health history   67 y.o. female  Admitting Dx: Fall  ASSESSMENT: Pt admitted post fall at home. Pt has hx of colon CA, s/p bowel resection, CVA, seizure, CKD 2. Per notes pt has been becoming weaker.  Weight hx shows variable weight hx, but has lost 6 lbs in the past 2 months.  Pt denies any weight loss, states she is eating very well. Does not know how much she weighs. Wants to eat now, but currently NPO.  Pt is at increased risk for malnutrition given underweight status.   Height: Ht Readings from Last 1 Encounters:  07/23/12 5\' 2"  (1.575 m)    Weight: Wt Readings from Last 1 Encounters:  07/24/12 97 lb 11 oz (44.31 kg)    Ideal Body Weight: 110 lbs  % Ideal Body Weight: 88%  Wt Readings from Last 10 Encounters:  07/24/12 97 lb 11 oz (44.31 kg)  06/06/12 103 lb (46.72 kg)  05/07/12 101 lb (45.813 kg)  02/06/12 106 lb (48.081 kg)  01/11/12 101 lb 14.4 oz (46.222 kg)  01/05/12 101 lb 9.6 oz (46.085 kg)  12/12/11 107 lb (48.535 kg)  12/07/11 103 lb 3.2 oz (46.811 kg)  12/01/11 102 lb 6.4 oz (46.448 kg)  10/10/11 103 lb (46.72 kg)    Usual Body Weight: 103 lbs   % Usual Body Weight: 94%  BMI:  Body mass index is 17.86 kg/(m^2).  Estimated Nutritional Needs: Kcal: 1300-1500 Protein: 40-50 gm  Fluid: >/= 1.5 L   Skin: intact  Diet Order: NPO  EDUCATION NEEDS: -No education needs identified at this time  No intake or output data in the 24 hours ending 07/24/12 0913  Last BM: 5/6    Labs:   Recent Labs Lab 07/23/12 1035 07/24/12 0500   NA 138 146*  K 3.9 3.6  CL 99 108  CO2 23 27  BUN 26* 30*  CREATININE 0.98 1.05  CALCIUM 11.0* 9.7  GLUCOSE 106* 107*    CBG (last 3)  No results found for this basename: GLUCAP,  in the last 72 hours  Scheduled Meds: . amLODipine  10 mg Oral Daily  . cefTRIAXone (ROCEPHIN)  IV  1 g Intravenous Q24H  . cloNIDine  0.2 mg Oral TID  . enoxaparin (LOVENOX) injection  30 mg Subcutaneous Q24H  . lisinopril  20 mg Oral Daily    Continuous Infusions: . sodium chloride 125 mL/hr at 07/23/12 2107    Past Medical History  Diagnosis Date  . 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 Myna Hidalgo w/sandostatin injections q monthly  . Anemia     iron deficiency  . Depression   . Hyperlipidemia   . Hypertension   . History of cocaine abuse     quit 03/06; seizure and HTN urgency secondary to use 12/04  . Seizures   . Cerebrovascular accident, hemorrhagic 02/05    left putamen; 5 x 2.5 cm in size L putamen hemorrhage, pronounced residual right hemiparesis (arm and leg), prior ischemic lacunar infarcts (external capsule, left/caudal putamen, left thalamus seen  on 11/04 head CT)  . Weight loss     15 lbs 08/07 (regained 104 08/07 and 112 lbs 12/07)  . Arthritis   . Halitosis     per notes  . Herpes zoster of eye 04/09    right eye  . Chronic kidney disease, stage III (moderate)     BL Cr 1.2-1.3    History reviewed. No pertinent past surgical history.  Clarene Duke RD, LDN Pager [phone] After Hours pager [phone]

## 2012-07-24 NOTE — Progress Notes (Signed)
Utilization review completed. Yarden Manuelito, RN, BSN. 

## 2012-07-24 NOTE — Progress Notes (Signed)
Orthopedic Tech Progress Note Patient Details:  [name] [birth year] [id number] Applied overhead frame to bed.     Jennye Moccasin 07/24/2012, 9:57 PM

## 2012-07-24 NOTE — Anesthesia Preprocedure Evaluation (Addendum)
Anesthesia Evaluation  Patient identified by MRN, date of birth, ID band Patient awake    Reviewed: Allergy & Precautions, H&P , NPO status , Patient's Chart, lab work & pertinent test results, reviewed documented beta blocker date and time   Airway Mallampati: II TM Distance: >3 FB Neck ROM: full    Dental  (+) Teeth Intact, Partial Upper, Partial Lower and Dental Advisory Given   Pulmonary          Cardiovascular hypertension, Pt. on medications     Neuro/Psych Seizures -,  Depression CVA    GI/Hepatic   Endo/Other    Renal/GU Renal InsufficiencyRenal disease     Musculoskeletal  (+) Arthritis -,   Abdominal   Peds  Hematology   Anesthesia Other Findings   Reproductive/Obstetrics                          Anesthesia Physical Anesthesia Plan  ASA: III  Anesthesia Plan: General   Post-op Pain Management:    Induction: Intravenous  Airway Management Planned: Oral ETT  Additional Equipment:   Intra-op Plan:   Post-operative Plan: Extubation in OR  Informed Consent: I have reviewed the patients History and Physical, chart, labs and discussed the procedure including the risks, benefits and alternatives for the proposed anesthesia with the patient or authorized representative who has indicated his/her understanding and acceptance.     Plan Discussed with: CRNA, Anesthesiologist and Surgeon  Anesthesia Plan Comments:         Anesthesia Quick Evaluation

## 2012-07-24 NOTE — Care Management Note (Unsigned)
    Page 1 of 1   07/24/2012     5:03:28 PM   CARE MANAGEMENT NOTE 07/24/2012  Patient:  [name], [name]   Account Number:  [id number]  Date Initiated:  07/24/2012  Documentation initiated by:  Letha Cape  Subjective/Objective Assessment:   dx fall,  admit from [facility]     Action/Plan:   Anticipated DC Date:  07/26/2012   Anticipated DC Plan:  HOME/SELF CARE      DC Planning Services  CM consult      Choice offered to / List presented to:             Status of service:  In process, will continue to follow Medicare Important Message given?   (If response is "NO", the following Medicare IM given date fields will be blank) Date Medicare IM given:   Date Additional Medicare IM given:    Discharge Disposition:    Per UR Regulation:  Reviewed for med. necessity/level of care/duration of stay  If discussed at Long Length of Stay Meetings, dates discussed:    Comments:  07/24/12 17:02 Letha Cape RN, BSN [phone] patient is from [facility], notes state this is a indep living,CSW referral.

## 2012-07-24 NOTE — H&P (Signed)
Internal Medicine Teaching Service Attending Note Date: 07/24/2012  Patient name: [name]  Medical record number: [id number]  Date of birth: 03-27-1945    I have seen and evaluated [name] and discussed their care with the Residency Team.    [name] is a 67yo woman with PMH of HTN, metastatic carcinoid cancer s/p bowel resection, depression, HLD, h/o CVA, CKD who presents after being found down by her sister.  The patient reports that she intended to lie down on the floor to watch tv and that she had not difficulty getting up on her own.  Her sister was not present to corroborate.  Based on discussion with residents, apparently [name] lives in an independent living facility and her sister has become more concerned about her safety by herself.  She uses a walker at baseline.  She further complains of some pain in the right hip and ankle.  She does not recall an injury and adamantly denies any recent falling.  Otherwise, she is asymptomatic.  Xrays done in the ED revealed a right hip fracture.    For further medical history, meds, allergies and ROS, please see resident note.   Physical Exam: Blood pressure 148/57, pulse 66, temperature 98.7 F (37.1 C), temperature source Oral, resp. rate 16, height 5\' 2"  (1.575 m), weight 97 lb 11 oz (44.31 kg), SpO2 99.00%. General appearance: alert, cooperative, appears stated age, no distress and unclear of why she is here Head: Normocephalic, without obvious abnormality, atraumatic Eyes: EOMI, anicteric Lungs: clear to auscultation bilaterally and only assessed anteriorly as patient could not roll due to hip fracture Heart: RR, NR, no murmur Abdomen: soft, NT, ND Extremities: + pain on palpation of right hip.  Did not manipulate due to hip fracture Pulses: good pulses in upper and lower extremities Neuro: Moving all extremities.  Some chronic right facial droop.   Lab results: Results for orders placed during the hospital  encounter of 07/23/12 (from the past 24 hour(s))  CK     Status: Abnormal   Collection Time    07/23/12  3:11 PM      Result Value Range   Total CK 1660 (*) 7 - 177 U/L  ETHANOL     Status: None   Collection Time    07/23/12  8:16 PM      Result Value Range   Alcohol, Ethyl (B) <11  0 - 11 mg/dL  TSH     Status: None   Collection Time    07/23/12  8:16 PM      Result Value Range   TSH 1.833  0.350 - 4.500 uIU/mL  TROPONIN I     Status: None   Collection Time    07/23/12  8:16 PM      Result Value Range   Troponin I <0.30  <0.30 ng/mL  TROPONIN I     Status: None   Collection Time    07/24/12  1:43 AM      Result Value Range   Troponin I <0.30  <0.30 ng/mL  BASIC METABOLIC PANEL     Status: Abnormal   Collection Time    07/24/12  5:00 AM      Result Value Range   Sodium 146 (*) 135 - 145 mEq/L   Potassium 3.6  3.5 - 5.1 mEq/L   Chloride 108  96 - 112 mEq/L   CO2 27  19 - 32 mEq/L   Glucose, Bld 107 (*) 70 - 99 mg/dL  BUN 30 (*) 6 - 23 mg/dL   Creatinine, Ser 1.19  0.50 - 1.10 mg/dL   Calcium 9.7  8.4 - 14.7 mg/dL   GFR calc non Af Amer 54 (*) >90 mL/min   GFR calc Af Amer 62 (*) >90 mL/min  CK     Status: Abnormal   Collection Time    07/24/12  5:00 AM      Result Value Range   Total CK 1004 (*) 7 - 177 U/L  TROPONIN I     Status: None   Collection Time    07/24/12  7:28 AM      Result Value Range   Troponin I <0.30  <0.30 ng/mL  PROTIME-INR     Status: None   Collection Time    07/24/12 10:40 AM      Result Value Range   Prothrombin Time 12.9  11.6 - 15.2 seconds   INR 0.98  0.00 - 1.49    Imaging results:  Dg Hip Complete Right  07/23/2012  *RADIOLOGY REPORT*  Clinical Data: Right hip pain.  RIGHT HIP - COMPLETE 2+ VIEW  Comparison: None.  Findings: The patient has an acute subcapital fracture right hip. No other bony or joint abnormality is identified.  IMPRESSION: Acute subcapital fracture right hip.   Original Report Authenticated By: Holley Dexter,  M.D.    Mr Brain Wo Contrast  07/23/2012  *RADIOLOGY REPORT*  Clinical Data: Right facial droop and extremity weakness.  Slurred speech.  MRI HEAD WITHOUT CONTRAST  Technique:  Multiplanar, multiecho pulse sequences of the brain and surrounding structures were obtained according to standard protocol without intravenous contrast.  Comparison: CT head without contrast 05/11/2003.  Findings: A remote hemorrhagic infarcts of the left putamen is evident.  Diffuse remote white matter infarction is also noted on the left.  No acute infarct or hemorrhage is evident.  Moderate generalized atrophy and white matter disease is present bilaterally. Additional remote lacunar infarction noted in the basal ganglia, more prominent left than right thalami.  Wallerian degeneration is evident within the left cerebral peduncle.  Remote lacunar infarcts are present in the cerebellum bilaterally, worse on the left.  Flow is present in the major intracranial arteries.  The globes and orbits are intact.  The paranasal sinuses and mastoid air cells are clear.  IMPRESSION:  1.  No acute intracranial abnormality. 2.  Remote hemorrhagic infarct of the left basal ganglia with associated wallerian degeneration. 3.  Age advanced atrophy and extensive white matter disease likely reflects the sequelae of chronic microvascular ischemia.   Original Report Authenticated By: Marin Roberts, M.D.    Dg Foot Complete Right  07/23/2012  *RADIOLOGY REPORT*  Clinical Data: Extremity weakness.  Pain.  RIGHT FOOT COMPLETE - 3+ VIEW  Comparison: None.  Findings: No acute bony or joint abnormality is identified.  No erosion or notable degenerative change is seen.  Bones appear somewhat osteopenic.  IMPRESSION: No acute finding.   Original Report Authenticated By: Holley Dexter, M.D.     Assessment and Plan: I agree with the formulated Assessment and Plan with the following changes:   1. Hip Fracture - Orthopedics consulted  2. ?  Deconditioning - Per report, she has been getting weaker.  She denies any injury/fall, so it is unclear when her hip fracture occurred - PT/OT  3. ? UTI - Culture pending - + LE on UA, rocephin started - Would discontinue therapy if UC results negative  4. Hypercalcemia, h/o carcinoid.  - There  is some concern expressed in her chart re: recurrence of carcinoid - Can be worked up in the outpatient setting  5. RBBB on EKG - Check new EKG today, as previous was difficult to interpret 2/2 wavering baseline  Other issues per resident note.   Inez Catalina, MD 5/6/20141:17 PM

## 2012-07-24 NOTE — Progress Notes (Signed)
Dr Katrinka Blazing aware of BP, no orders received

## 2012-07-24 NOTE — Anesthesia Procedure Notes (Signed)
Procedure Name: Intubation Date/Time: 07/24/2012 3:24 PM Performed by: Darcey Nora B Pre-anesthesia Checklist: Patient identified, Emergency Drugs available, Suction available and Patient being monitored Patient Re-evaluated:Patient Re-evaluated prior to inductionOxygen Delivery Method: Circle system utilized Preoxygenation: Pre-oxygenation with 100% oxygen Intubation Type: IV induction Ventilation: Mask ventilation without difficulty Laryngoscope Size: Mac and 3 Grade View: Grade I Tube type: Oral Tube size: 7.5 mm Number of attempts: 1 Airway Equipment and Method: Stylet Placement Confirmation: ETT inserted through vocal cords under direct vision,  breath sounds checked- equal and bilateral and positive ETCO2 Secured at: 21 (cm at teeth) cm Tube secured with: Tape Dental Injury: Teeth and Oropharynx as per pre-operative assessment

## 2012-07-24 NOTE — Brief Op Note (Signed)
07/23/2012 - 07/24/2012  4:16 PM  PATIENT:  [name]  67 y.o. female  PRE-OPERATIVE DIAGNOSIS:  right fem neck fracture  POST-OPERATIVE DIAGNOSIS:  right fem neck fracture  PROCEDURE:  Procedure(s): ARTHROPLASTY BIPOLAR HIP (Right), DePuy TriLoc  SURGEON:  Surgeon(s) and Role:    * Verlee Rossetti, MD - Primary  PHYSICIAN ASSISTANT:   ASSISTANTS: Thea Gist, PA-C   ANESTHESIA:   general  EBL:  Total I/O In: 50 [I.V.:50] Out: 200 [Urine:100; Blood:100]  BLOOD ADMINISTERED:none  DRAINS: none   LOCAL MEDICATIONS USED:  NONE  SPECIMEN:  No Specimen  DISPOSITION OF SPECIMEN:  N/A  COUNTS:  YES  TOURNIQUET:  * No tourniquets in log *  DICTATION: .Other Dictation: Dictation Number [id number]  PLAN OF CARE: Admit to inpatient   PATIENT DISPOSITION:  PACU - hemodynamically stable.   Delay start of Pharmacological VTE agent (>24hrs) due to surgical blood loss or risk of bleeding: not applicable

## 2012-07-24 NOTE — Interval H&P Note (Signed)
History and Physical Interval Note:  07/24/2012 2:38 PM  [name]  has presented today for surgery, with the diagnosis of r fem neck fracture  The various methods of treatment have been discussed with the patient and family. After consideration of risks, benefits and other options for treatment, the patient has consented to  Procedure(s): ARTHROPLASTY BIPOLAR HIP (Right) as a surgical intervention .  The patient's history has been reviewed, patient examined, no change in status, stable for surgery.  I have reviewed the patient's chart and labs.  Questions were answered to the patient's satisfaction.     Jaelon Gatley,STEVEN R

## 2012-07-24 NOTE — Transfer of Care (Signed)
Immediate Anesthesia Transfer of Care Note  Patient: [name]  Procedure(s) Performed: Procedure(s): ARTHROPLASTY BIPOLAR HIP (Right)  Patient Location: PACU  Anesthesia Type:General  Level of Consciousness: awake  Airway & Oxygen Therapy: Patient Spontanous Breathing and Patient connected to face mask oxygen  Post-op Assessment: Report given to PACU RN and Post -op Vital signs reviewed and stable  Post vital signs: Reviewed and stable  Complications: No apparent anesthesia complications

## 2012-07-24 NOTE — Progress Notes (Signed)
   Subjective: Day of Surgery Procedure(s) (LRB): ARTHROPLASTY BIPOLAR HIP (Right)  Pt doing well with only mild pain to right hip currently Main question is when she can go home Patient reports pain as mild.  Objective:   VITALS:   Filed Vitals:   07/24/12 2227  BP: 136/64  Pulse: 65  Temp: 97.7 F (36.5 C)  Resp: 18   Dressing dry Wound healing well Neurologically intact distally  LABS  Recent Labs  07/23/12 1035 07/24/12 1853  HGB 12.9 10.3*  HCT 36.5 30.2*  WBC 7.7 9.1  PLT 215 194     Recent Labs  07/23/12 1035 07/24/12 0500 07/24/12 1853  NA 138 146*  --   K 3.9 3.6  --   BUN 26* 30*  --   CREATININE 0.98 1.05 0.99  GLUCOSE 106* 107*  --      Assessment/Plan: Day of Surgery Procedure(s) (LRB): ARTHROPLASTY BIPOLAR HIP (Right) Pt s/p surgery several hours ago doing well tonight Pain under control  Up with therapy tomorrow Pain control as needed Pulmonary toilet D/c planning dvt prophylaxis   Alphonsa Overall, MPAS, PA-C  07/24/2012, 10:33 PM

## 2012-07-24 NOTE — Progress Notes (Signed)
Subjective: Lying in bed. Denies any current pain. When I told her that her Xray last night showed a right hip fracture, she is surprised and denies ever falling.  Denies CP, SOB, N/V, dizziness, abdominal pain, diarrhea.  Objective: Vital signs in last 24 hours: Filed Vitals:   07/23/12 1930 07/23/12 2011 07/23/12 2237 07/24/12 0612  BP: 168/88 181/64 141/61 150/68  Pulse: 86 81  66  Temp:  98.5 F (36.9 C)  98.7 F (37.1 C)  TempSrc:  Oral  Oral  Resp:  18  16  Height:  5\' 2"  (1.575 m)    Weight:  97 lb 10.6 oz (44.3 kg)  97 lb 11 oz (44.31 kg)  SpO2: 100% 100%  99%   Weight change:  No intake or output data in the 24 hours ending 07/24/12 0853 Vitals reviewed.  General: Lying in bed, talking animatedly  HEENT: PERRL, EOMI, no scleral icterus  Cardiac: RRR, no rubs, murmurs or gallops  Pulm: clear to auscultation bilaterally, no wheezes, rales, or rhonchi  Abd: Surgical scars noted. Soft, nontender, nondistended, BS present  Ext: Still with nonpitting edema of right foot. Unable to actively flex the right hip. Cries out in pain with passive right hip flexion. Pulses are intact distally in bilateral lower extremities. Neuro: Patient is tangential and easily distractible   Lab Results: Basic Metabolic Panel:  Recent Labs Lab 07/23/12 1035 07/24/12 0500  NA 138 146*  K 3.9 3.6  CL 99 108  CO2 23 27  GLUCOSE 106* 107*  BUN 26* 30*  CREATININE 0.98 1.05  CALCIUM 11.0* 9.7   CBC:  Recent Labs Lab 07/23/12 1035  WBC 7.7  NEUTROABS 6.4  HGB 12.9  HCT 36.5  MCV 88.4  PLT 215   Cardiac Enzymes:  Recent Labs Lab 07/23/12 1035 07/23/12 1511 07/23/12 2016 07/24/12 0143 07/24/12 0500 07/24/12 0728  CKTOTAL 1858* 1660*  --   --  1004*  --   TROPONINI  --   --  <0.30 <0.30  --  <0.30   Thyroid Function Tests:  Recent Labs Lab 07/23/12 2016  TSH 1.833   Urine Drug Screen: Drugs of Abuse     Component Value Date/Time   LABOPIA NONE DETECTED 07/23/2012  1317   COCAINSCRNUR NONE DETECTED 07/23/2012 1317   COCAINSCRNUR NEG 03/04/2008 1950   LABBENZ NONE DETECTED 07/23/2012 1317   LABBENZ NEG 03/04/2008 1950   AMPHETMU NONE DETECTED 07/23/2012 1317   AMPHETMU NEG 03/04/2008 1950   THCU NONE DETECTED 07/23/2012 1317   LABBARB NONE DETECTED 07/23/2012 1317    Alcohol Level:  Recent Labs Lab 07/23/12 2016  ETH <11   Urinalysis:  Recent Labs Lab 07/23/12 1317  COLORURINE YELLOW  LABSPEC 1.029  PHURINE 5.0  GLUCOSEU NEGATIVE  HGBUR NEGATIVE  BILIRUBINUR SMALL*  KETONESUR NEGATIVE  PROTEINUR >300*  UROBILINOGEN 1.0  NITRITE NEGATIVE  LEUKOCYTESUR MODERATE*   Studies/Results: Dg Hip Complete Right  07/23/2012  *RADIOLOGY REPORT*  Clinical Data: Right hip pain.  RIGHT HIP - COMPLETE 2+ VIEW  Comparison: None.  Findings: The patient has an acute subcapital fracture right hip. No other bony or joint abnormality is identified.  IMPRESSION: Acute subcapital fracture right hip.   Original Report Authenticated By: Holley Dexter, M.D.    Mr Brain Wo Contrast  07/23/2012  *RADIOLOGY REPORT*  Clinical Data: Right facial droop and extremity weakness.  Slurred speech.  MRI HEAD WITHOUT CONTRAST  Technique:  Multiplanar, multiecho pulse sequences of the brain and surrounding structures  were obtained according to standard protocol without intravenous contrast.  Comparison: CT head without contrast 05/11/2003.  Findings: A remote hemorrhagic infarcts of the left putamen is evident.  Diffuse remote white matter infarction is also noted on the left.  No acute infarct or hemorrhage is evident.  Moderate generalized atrophy and white matter disease is present bilaterally. Additional remote lacunar infarction noted in the basal ganglia, more prominent left than right thalami.  Wallerian degeneration is evident within the left cerebral peduncle.  Remote lacunar infarcts are present in the cerebellum bilaterally, worse on the left.  Flow is present in the major  intracranial arteries.  The globes and orbits are intact.  The paranasal sinuses and mastoid air cells are clear.  IMPRESSION:  1.  No acute intracranial abnormality. 2.  Remote hemorrhagic infarct of the left basal ganglia with associated wallerian degeneration. 3.  Age advanced atrophy and extensive white matter disease likely reflects the sequelae of chronic microvascular ischemia.   Original Report Authenticated By: Marin Roberts, M.D.    Dg Foot Complete Right  07/23/2012  *RADIOLOGY REPORT*  Clinical Data: Extremity weakness.  Pain.  RIGHT FOOT COMPLETE - 3+ VIEW  Comparison: None.  Findings: No acute bony or joint abnormality is identified.  No erosion or notable degenerative change is seen.  Bones appear somewhat osteopenic.  IMPRESSION: No acute finding.   Original Report Authenticated By: Holley Dexter, M.D.    Medications: I have reviewed the patient's current medications. Scheduled Meds: . amLODipine  10 mg Oral Daily  . cefTRIAXone (ROCEPHIN)  IV  1 g Intravenous Q24H  . cloNIDine  0.2 mg Oral TID  . enoxaparin (LOVENOX) injection  30 mg Subcutaneous Q24H  . lisinopril  20 mg Oral Daily   Continuous Infusions: . sodium chloride 125 mL/hr at 07/23/12 2107   PRN Meds:.  Assessment/Plan: Assessment & Plan by Problem:  67 yo female presentes after being found down.  1) Fall with R hip fracture Likely due to progressive deconditioning, UTI may be contributing. No new acute infarct on MRI brain. Plain film R hip showed acute subcapital fracture.  - Ortho (Dr. Ranell Patrick) to see patient today, currently NPO - Will need rehab for hip as well as likely full supervision after she recovers.  - Treat UTI w ceftriaxone pending culture and sensitivities  2) HTN, uncontrolled  Patient appears to have isolated systolic hypertension with a blood pressure 216/84 in the ED, which decreased to 173/61 after she was given her home medications. This am BP is 150/68 She is supposed to be  taking lisinopril, clonidine, amlodipine.  -Continue home antihypertensives   3) Hypercalcemia, possible hyperparathyroidism  Noted to be hypercalcemic in the past with elevated PTH. Further workup with consideration of MEN syndrome with PET-CT recommeded as outpatient, but not yet completed. Calcium today is 9.7.  - Continue to follow calcium. Returned wnl this am. Will likely still need PET-CT when acute issues resolve.   4) Metastatic carcinoid tumor s/p small bowel resection  Follows w Dr. Myna Hidalgo. On sandostatin monthly. According to the most recent notes, her chromogranin A. level has been increasing, which is concerning. According to her oncologist, repeat CT scan may be needed.   5) HLD  Hold statin given mild CK elevation.  Dispo: Disposition is deferred at this time, awaiting improvement of current medical problems.  Anticipated discharge in approximately 3-4 day(s).   The patient does have a current PCP (KALIA-REYNOLDS, SHELLY, DO), therefore will be requiring OPC follow-up after discharge.    Marland Kitchen  Services Needed at time of discharge: Y = Yes, Blank = No PT:   OT:   RN:   Equipment:   Other:     LOS: 1 day   Bronson Curb 07/24/2012, 8:53 AM

## 2012-07-24 NOTE — H&P (Signed)
[name] is an 67 y.o. female.    Chief Complaint: unwitnessed fall injuring right hip and right lower leg  HPI: 67 y/o female found on the floor with unknown fall and unknown duration of fall. Initially c/o right foot/ankle pain with minimal pain to right hip unless moved. X-rays show femoral neck fracture. Pt cleared from standpoint of the medical team for surgery. Will proceed later today  PCP:  Johnette Abraham, DO  PMH: Past Medical History  Diagnosis Date  . 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 Myna Hidalgo w/sandostatin injections q monthly  . Anemia     iron deficiency  . Depression   . Hyperlipidemia   . Hypertension   . History of cocaine abuse     quit 03/06; seizure and HTN urgency secondary to use 12/04  . Seizures   . 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 Weight loss     15 lbs 08/07 (regained 104 08/07 and 112 lbs 12/07)  . Arthritis   . Halitosis     per notes  . Herpes zoster of eye 04/09    right eye  . Chronic kidney disease, stage III (moderate)     BL Cr 1.2-1.3    PSH: History reviewed. No pertinent past surgical history.  Social History:  reports that she has never smoked. She does not have any smokeless tobacco history on file. She reports that she does not drink alcohol or use illicit drugs.  Allergies:  No Known Allergies  Medications: Current Facility-Administered Medications  Medication Dose Route Frequency Provider Last Rate Last Dose  . 0.9 %  sodium chloride infusion   Intravenous Continuous Belia Heman, MD 75 mL/hr at 07/24/12 0913    . amLODipine (NORVASC) tablet 10 mg  10 mg Oral Daily Belia Heman, MD   10 mg at 07/24/12 1055  . cefTRIAXone  (ROCEPHIN) 1 g in dextrose 5 % 50 mL IVPB  1 g Intravenous Q24H Neema Davina Poke, MD   1 g at 07/24/12 1056  . cloNIDine (CATAPRES) tablet 0.2 mg  0.2 mg Oral TID Belia Heman, MD   0.2 mg at 07/24/12 1057  . lisinopril (PRINIVIL,ZESTRIL) tablet 20 mg  20 mg Oral Daily Belia Heman, MD   20 mg at 07/24/12 1055    Results for orders placed during the hospital encounter of 07/23/12 (from the past 48 hour(s))  CBC WITH DIFFERENTIAL     Status: Abnormal   Collection Time    07/23/12 10:35 AM      Result Value Range   WBC 7.7  4.0 - 10.5 K/uL   RBC 4.13  3.87 - 5.11 MIL/uL   Hemoglobin 12.9  12.0 - 15.0 g/dL   HCT 16.1  09.6 - 04.5 %   MCV 88.4  78.0 - 100.0 fL   MCH 31.2  26.0 - 34.0 pg   MCHC 35.3  30.0 - 36.0 g/dL   RDW 40.9  81.1 - 91.4 %   Platelets 215  150 - 400 K/uL   Neutrophils Relative 83 (*) 43 - 77 %   Neutro Abs 6.4  1.7 - 7.7 K/uL   Lymphocytes Relative 9 (*) 12 - 46 %  Lymphs Abs 0.7  0.7 - 4.0 K/uL   Monocytes Relative 8  3 - 12 %   Monocytes Absolute 0.6  0.1 - 1.0 K/uL   Eosinophils Relative 0  0 - 5 %   Eosinophils Absolute 0.0  0.0 - 0.7 K/uL   Basophils Relative 0  0 - 1 %   Basophils Absolute 0.0  0.0 - 0.1 K/uL  BASIC METABOLIC PANEL     Status: Abnormal   Collection Time    07/23/12 10:35 AM      Result Value Range   Sodium 138  135 - 145 mEq/L   Potassium 3.9  3.5 - 5.1 mEq/L   Chloride 99  96 - 112 mEq/L   CO2 23  19 - 32 mEq/L   Glucose, Bld 106 (*) 70 - 99 mg/dL   BUN 26 (*) 6 - 23 mg/dL   Creatinine, Ser 1.61  0.50 - 1.10 mg/dL   Calcium 09.6 (*) 8.4 - 10.5 mg/dL   GFR calc non Af Amer 58 (*) >90 mL/min   GFR calc Af Amer 68 (*) >90 mL/min   Comment:            The eGFR has been calculated     using the CKD EPI equation.     This calculation has not been     validated in all clinical     situations.     eGFR's persistently     <90 mL/min signify     possible Chronic Kidney Disease.  CK     Status: Abnormal   Collection Time    07/23/12  10:35 AM      Result Value Range   Total CK 1858 (*) 7 - 177 U/L  CG4 I-STAT (LACTIC ACID)     Status: None   Collection Time    07/23/12 10:45 AM      Result Value Range   Lactic Acid, Venous 1.58  0.5 - 2.2 mmol/L  URINALYSIS, ROUTINE W REFLEX MICROSCOPIC     Status: Abnormal   Collection Time    07/23/12  1:17 PM      Result Value Range   Color, Urine YELLOW  YELLOW   APPearance HAZY (*) CLEAR   Specific Gravity, Urine 1.029  1.005 - 1.030   pH 5.0  5.0 - 8.0   Glucose, UA NEGATIVE  NEGATIVE mg/dL   Hgb urine dipstick NEGATIVE  NEGATIVE   Bilirubin Urine SMALL (*) NEGATIVE   Ketones, ur NEGATIVE  NEGATIVE mg/dL   Protein, ur >045 (*) NEGATIVE mg/dL   Urobilinogen, UA 1.0  0.0 - 1.0 mg/dL   Nitrite NEGATIVE  NEGATIVE   Leukocytes, UA MODERATE (*) NEGATIVE  URINE MICROSCOPIC-ADD ON     Status: Abnormal   Collection Time    07/23/12  1:17 PM      Result Value Range   Squamous Epithelial / LPF FEW (*) RARE   WBC, UA 21-50  <3 WBC/hpf   RBC / HPF 0-2  <3 RBC/hpf   Bacteria, UA FEW (*) RARE   Casts HYALINE CASTS (*) NEGATIVE   Comment: GRANULAR CAST  URINE RAPID DRUG SCREEN (HOSP PERFORMED)     Status: None   Collection Time    07/23/12  1:17 PM      Result Value Range   Opiates NONE DETECTED  NONE DETECTED   Cocaine NONE DETECTED  NONE DETECTED   Benzodiazepines NONE DETECTED  NONE DETECTED   Amphetamines NONE DETECTED  NONE  DETECTED   Tetrahydrocannabinol NONE DETECTED  NONE DETECTED   Barbiturates NONE DETECTED  NONE DETECTED   Comment:            DRUG SCREEN FOR MEDICAL PURPOSES     ONLY.  IF CONFIRMATION IS NEEDED     FOR ANY PURPOSE, NOTIFY LAB     WITHIN 5 DAYS.                LOWEST DETECTABLE LIMITS     FOR URINE DRUG SCREEN     Drug Class       Cutoff (ng/mL)     Amphetamine      1000     Barbiturate      200     Benzodiazepine   200     Tricyclics       300     Opiates          300     Cocaine          300     THC              50  CK     Status:  Abnormal   Collection Time    07/23/12  3:11 PM      Result Value Range   Total CK 1660 (*) 7 - 177 U/L  ETHANOL     Status: None   Collection Time    07/23/12  8:16 PM      Result Value Range   Alcohol, Ethyl (B) <11  0 - 11 mg/dL   Comment:            LOWEST DETECTABLE LIMIT FOR     SERUM ALCOHOL IS 11 mg/dL     FOR MEDICAL PURPOSES ONLY  TSH     Status: None   Collection Time    07/23/12  8:16 PM      Result Value Range   TSH 1.833  0.350 - 4.500 uIU/mL  TROPONIN I     Status: None   Collection Time    07/23/12  8:16 PM      Result Value Range   Troponin I <0.30  <0.30 ng/mL   Comment:            Due to the release kinetics of cTnI,     a negative result within the first hours     of the onset of symptoms does not rule out     myocardial infarction with certainty.     If myocardial infarction is still suspected,     repeat the test at appropriate intervals.  TROPONIN I     Status: None   Collection Time    07/24/12  1:43 AM      Result Value Range   Troponin I <0.30  <0.30 ng/mL   Comment:            Due to the release kinetics of cTnI,     a negative result within the first hours     of the onset of symptoms does not rule out     myocardial infarction with certainty.     If myocardial infarction is still suspected,     repeat the test at appropriate intervals.  BASIC METABOLIC PANEL     Status: Abnormal   Collection Time    07/24/12  5:00 AM      Result Value Range   Sodium 146 (*) 135 - 145 mEq/L   Potassium 3.6  3.5 - 5.1 mEq/L   Chloride 108  96 - 112 mEq/L   CO2 27  19 - 32 mEq/L   Glucose, Bld 107 (*) 70 - 99 mg/dL   BUN 30 (*) 6 - 23 mg/dL   Creatinine, Ser 0.98  0.50 - 1.10 mg/dL   Calcium 9.7  8.4 - 11.9 mg/dL   GFR calc non Af Amer 54 (*) >90 mL/min   GFR calc Af Amer 62 (*) >90 mL/min   Comment:            The eGFR has been calculated     using the CKD EPI equation.     This calculation has not been     validated in all clinical      situations.     eGFR's persistently     <90 mL/min signify     possible Chronic Kidney Disease.  CK     Status: Abnormal   Collection Time    07/24/12  5:00 AM      Result Value Range   Total CK 1004 (*) 7 - 177 U/L  TROPONIN I     Status: None   Collection Time    07/24/12  7:28 AM      Result Value Range   Troponin I <0.30  <0.30 ng/mL   Comment:            Due to the release kinetics of cTnI,     a negative result within the first hours     of the onset of symptoms does not rule out     myocardial infarction with certainty.     If myocardial infarction is still suspected,     repeat the test at appropriate intervals.  PROTIME-INR     Status: None   Collection Time    07/24/12 10:40 AM      Result Value Range   Prothrombin Time 12.9  11.6 - 15.2 seconds   INR 0.98  0.00 - 1.49   Dg Hip Complete Right  07/23/2012  *RADIOLOGY REPORT*  Clinical Data: Right hip pain.  RIGHT HIP - COMPLETE 2+ VIEW  Comparison: None.  Findings: The patient has an acute subcapital fracture right hip. No other bony or joint abnormality is identified.  IMPRESSION: Acute subcapital fracture right hip.   Original Report Authenticated By: Holley Dexter, M.D.    Mr Brain Wo Contrast  07/23/2012  *RADIOLOGY REPORT*  Clinical Data: Right facial droop and extremity weakness.  Slurred speech.  MRI HEAD WITHOUT CONTRAST  Technique:  Multiplanar, multiecho pulse sequences of the brain and surrounding structures were obtained according to standard protocol without intravenous contrast.  Comparison: CT head without contrast 05/11/2003.  Findings: A remote hemorrhagic infarcts of the left putamen is evident.  Diffuse remote white matter infarction is also noted on the left.  No acute infarct or hemorrhage is evident.  Moderate generalized atrophy and white matter disease is present bilaterally. Additional remote lacunar infarction noted in the basal ganglia, more prominent left than right thalami.  Wallerian  degeneration is evident within the left cerebral peduncle.  Remote lacunar infarcts are present in the cerebellum bilaterally, worse on the left.  Flow is present in the major intracranial arteries.  The globes and orbits are intact.  The paranasal sinuses and mastoid air cells are clear.  IMPRESSION:  1.  No acute intracranial abnormality. 2.  Remote hemorrhagic infarct of the left basal ganglia with associated wallerian degeneration. 3.  Age advanced atrophy  and extensive white matter disease likely reflects the sequelae of chronic microvascular ischemia.   Original Report Authenticated By: Marin Roberts, M.D.    Dg Foot Complete Right  07/23/2012  *RADIOLOGY REPORT*  Clinical Data: Extremity weakness.  Pain.  RIGHT FOOT COMPLETE - 3+ VIEW  Comparison: None.  Findings: No acute bony or joint abnormality is identified.  No erosion or notable degenerative change is seen.  Bones appear somewhat osteopenic.  IMPRESSION: No acute finding.   Original Report Authenticated By: Holley Dexter, M.D.     ROS: Inability to ambulate Recent fall   Physical Exam: Alert and appropriate 67 y/o female with no memory of a recent fall Bilateral upper extremities show full rom without tenderness nv intact distally Right lower extremity: mild external rotation and shortening nv intact distally No signs of open injury or infection No rashes or edema Left lower extremity with full rom and no deformity  Assessment/Plan Assessment: right femoral neck fracture  Plan:Discussed with patient that surgery is needed to allow her to return to weight bearing All risks and benefits of surgery were discussed Plan for surgery later today    @MSBSIG @

## 2012-07-24 NOTE — ED Provider Notes (Signed)
Medical screening examination/treatment/procedure(s) were conducted as a shared visit with non-physician practitioner(s) and myself.  I personally evaluated the patient during the encounter.  Patient presents with questionable stroke symptoms. Examination did reveal left-sided weakness, but has had a previous stroke. Patient never underwent MRI which did not show any new infarct.  Patient was found to have mild elevations of CPKs. This was because she laid on the floor for a prolonged period of time. Patient was administered fluid hydration and repeat CPK shows improvement, will be discharged with continued oral hydration.  Gilda Crease, MD 07/24/12 [phone]

## 2012-07-24 NOTE — Progress Notes (Signed)
Patient prepped and transport is here to get her for OR.

## 2012-07-24 NOTE — Progress Notes (Signed)
Patient scheduled for surgery for right hip fracture on 07/24/12.  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  Cardiac enzymes have been negative x 3.  She most recently had a chest CT on 07/23/09 that did not reveal thoracic metastatic disease, and no pulmonary pathology.  Per below revised cardiac index, patient only has 1 risk factor, which places her at 1% risk of major cardiac complications during orthopedic surgery.  Revised cardiac risk index - To simplify the prediction of risk, [name] monitored 2893 patients (mean age 99) undergoing elective major noncardiac procedures and identified six independent predictors of major cardiac complications (defined as myocardial infarction, pulmonary edema, ventricular fibrillation or primary cardiac arrest, and complete heart block; all cause mortality was not included): High-risk type of surgery (intraperitoneal, intrathoracic, or suprainguinal vascular surgery)  History of ischemic heart disease (history of MI or a positive exercise test, current complaint of chest pain considered to be secondary to myocardial ischemia, use of nitrate therapy, or ECG with pathological Q waves; do not count prior coronary revascularization procedure unless one of the other criteria for ischemic heart disease is present)  History of HF  History of cerebrovascular disease  Diabetes mellitus requiring treatment with insulin  Preoperative serum creatinine >2.0 mg/dL (409 mol/L)

## 2012-07-25 ENCOUNTER — Encounter (HOSPITAL_COMMUNITY): Payer: Self-pay | Admitting: Orthopedic Surgery

## 2012-07-25 LAB — BASIC METABOLIC PANEL
Chloride: 106 mEq/L (ref 96–112)
GFR calc Af Amer: 67 mL/min — ABNORMAL LOW (ref >90)
GFR calc non Af Amer: 58 mL/min — ABNORMAL LOW (ref >90)
Potassium: 3.5 mEq/L (ref 3.5–5.1)
Sodium: 140 mEq/L (ref 135–145)

## 2012-07-25 LAB — PTH, INTACT AND CALCIUM
Calcium, Total (PTH): 10.1 mg/dL (ref 8.4–10.5)
PTH: 244.6 pg/mL — ABNORMAL HIGH (ref 14.0–72.0)

## 2012-07-25 LAB — CBC
MCHC: 34.5 g/dL (ref 30.0–36.0)
Platelets: 180 10*3/uL (ref 150–400)
RDW: 12.5 % (ref 11.5–15.5)
WBC: 6.4 10*3/uL (ref 4.0–10.5)

## 2012-07-25 NOTE — Progress Notes (Signed)
Subjective: POD1 s/p R hip arthroplasty. No complaints this am. Denies postoperative pain. Denies CP, SOB, N/V, dizziness, abdominal pain, diarrhea.  Objective: Vital signs in last 24 hours: Filed Vitals:   07/24/12 1800 07/24/12 1845 07/24/12 2227 07/25/12 0644  BP: 152/67 145/59 136/64 140/68  Pulse: 63 68 65 62  Temp: 97.5 F (36.4 C) 98.9 F (37.2 C) 97.7 F (36.5 C) 97.9 F (36.6 C)  TempSrc:      Resp: 21 16 18 18   Height:      Weight:      SpO2: 100% 98% 99% 99%   Weight change:   Intake/Output Summary (Last 24 hours) at 07/25/12 0826 Last data filed at 07/25/12 1610  Gross per 24 hour  Intake   1450 ml  Output    650 ml  Net    800 ml   Vitals reviewed.  General: Lying in bed, sleeping. Easily arousable and talkative. Difficult to understand HEENT: PERRL, EOMI, no scleral icterus  Cardiac: RRR, no rubs, murmurs or gallops  Pulm: CTA anteriorly Abd: Surgical scars noted. Soft, nontender, nondistended, BS present  Ext: R hip incision site bandaged, no seeping blood or hematoma. Can wiggle R toes. Pulses are intact distally in bilateral lower extremities. Neuro: Patient is tangential and easily distractible   Lab Results: Basic Metabolic Panel:  Recent Labs Lab 07/24/12 0500 07/24/12 1853 07/25/12 0500  NA 146*  --  140  K 3.6  --  3.5  CL 108  --  106  CO2 27  --  27  GLUCOSE 107*  --  124*  BUN 30*  --  22  CREATININE 1.05 0.99 0.99  CALCIUM 9.7  --  9.1   CBC:  Recent Labs Lab 07/23/12 1035 07/24/12 1853 07/25/12 0500  WBC 7.7 9.1 6.4  NEUTROABS 6.4  --   --   HGB 12.9 10.3* 9.2*  HCT 36.5 30.2* 26.7*  MCV 88.4 89.9 89.9  PLT 215 194 180   Cardiac Enzymes:  Recent Labs Lab 07/23/12 1035 07/23/12 1511 07/23/12 2016 07/24/12 0143 07/24/12 0500 07/24/12 0728  CKTOTAL 1858* 1660*  --   --  1004*  --   TROPONINI  --   --  <0.30 <0.30  --  <0.30   Thyroid Function Tests:  Recent Labs Lab 07/23/12 2016  TSH 1.833   Urine Drug  Screen: Drugs of Abuse     Component Value Date/Time   LABOPIA NONE DETECTED 07/23/2012 1317   COCAINSCRNUR NONE DETECTED 07/23/2012 1317   COCAINSCRNUR NEG 03/04/2008 1950   LABBENZ NONE DETECTED 07/23/2012 1317   LABBENZ NEG 03/04/2008 1950   AMPHETMU NONE DETECTED 07/23/2012 1317   AMPHETMU NEG 03/04/2008 1950   THCU NONE DETECTED 07/23/2012 1317   LABBARB NONE DETECTED 07/23/2012 1317    Alcohol Level:  Recent Labs Lab 07/23/12 2016  ETH <11   Urinalysis:  Recent Labs Lab 07/23/12 1317  COLORURINE YELLOW  LABSPEC 1.029  PHURINE 5.0  GLUCOSEU NEGATIVE  HGBUR NEGATIVE  BILIRUBINUR SMALL*  KETONESUR NEGATIVE  PROTEINUR >300*  UROBILINOGEN 1.0  NITRITE NEGATIVE  LEUKOCYTESUR MODERATE*   Studies/Results: Dg Hip Complete Right  07/23/2012  *RADIOLOGY REPORT*  Clinical Data: Right hip pain.  RIGHT HIP - COMPLETE 2+ VIEW  Comparison: None.  Findings: The patient has an acute subcapital fracture right hip. No other bony or joint abnormality is identified.  IMPRESSION: Acute subcapital fracture right hip.   Original Report Authenticated By: Holley Dexter, M.D.    Mr  Brain Wo Contrast  07/23/2012  *RADIOLOGY REPORT*  Clinical Data: Right facial droop and extremity weakness.  Slurred speech.  MRI HEAD WITHOUT CONTRAST  Technique:  Multiplanar, multiecho pulse sequences of the brain and surrounding structures were obtained according to standard protocol without intravenous contrast.  Comparison: CT head without contrast 05/11/2003.  Findings: A remote hemorrhagic infarcts of the left putamen is evident.  Diffuse remote white matter infarction is also noted on the left.  No acute infarct or hemorrhage is evident.  Moderate generalized atrophy and white matter disease is present bilaterally. Additional remote lacunar infarction noted in the basal ganglia, more prominent left than right thalami.  Wallerian degeneration is evident within the left cerebral peduncle.  Remote lacunar infarcts are  present in the cerebellum bilaterally, worse on the left.  Flow is present in the major intracranial arteries.  The globes and orbits are intact.  The paranasal sinuses and mastoid air cells are clear.  IMPRESSION:  1.  No acute intracranial abnormality. 2.  Remote hemorrhagic infarct of the left basal ganglia with associated wallerian degeneration. 3.  Age advanced atrophy and extensive white matter disease likely reflects the sequelae of chronic microvascular ischemia.   Original Report Authenticated By: Marin Roberts, M.D.    Dg Pelvis Portable  07/24/2012  *RADIOLOGY REPORT*  Clinical Data: Postoperative tube placement.  PORTABLE PELVIS  Comparison: None.  Findings: Single oblique view of the right hip performed portably reveals total right hip replacement in satisfactory position without complication noted.  IMPRESSION: Post right hip replacement in satisfactory position without complication noted.   Original Report Authenticated By: Lacy Duverney, M.D.    Dg Foot Complete Right  07/23/2012  *RADIOLOGY REPORT*  Clinical Data: Extremity weakness.  Pain.  RIGHT FOOT COMPLETE - 3+ VIEW  Comparison: None.  Findings: No acute bony or joint abnormality is identified.  No erosion or notable degenerative change is seen.  Bones appear somewhat osteopenic.  IMPRESSION: No acute finding.   Original Report Authenticated By: Holley Dexter, M.D.    Medications: I have reviewed the patient's current medications. Scheduled Meds: . amLODipine  10 mg Oral Daily  . cefTRIAXone (ROCEPHIN)  IV  1 g Intravenous Q24H  . cloNIDine  0.2 mg Oral TID  . enoxaparin (LOVENOX) injection  30 mg Subcutaneous Q24H  . lisinopril  20 mg Oral Daily  . mupirocin ointment  1 application Nasal BID   Continuous Infusions: . sodium chloride 75 mL/hr at 07/24/12 0913  . sodium chloride 50 mL/hr at 07/24/12 2000  . lactated ringers 50 mL/hr at 07/24/12 1439   PRN Meds:.acetaminophen, acetaminophen, HYDROcodone-acetaminophen,  menthol-cetylpyridinium, metoCLOPramide (REGLAN) injection, metoCLOPramide, morphine injection, ondansetron (ZOFRAN) IV, ondansetron, phenol  Assessment/Plan: Assessment & Plan by Problem:  67 yo female presentes after being found down.  1) Fall with R hip fracture, s/p R hip arthroplasty Likely due to progressive deconditioning. No new acute infarct on MRI brain. Plain film R hip showed acute subcapital fracture, now POD1 s/p R hip arthroplasty. - Will need rehab for hip as well as likely full supervision after she recovers.   2) UTI Had 21-50 WBC/hpf in urine on arrival, but final culture w only 9,000 colonies, no organism identified. -Will d/c ceftriaxone today  3) HTN, uncontrolled  Patient appears to have isolated systolic hypertension with a blood pressure 216/84 in the ED, which decreased to 173/61 after she was given her home medications. This am BP is 140/68 She is supposed to be taking lisinopril, clonidine, amlodipine.  -  Continue home antihypertensives   4) Hypercalcemia, possible hyperparathyroidism  Noted to be hypercalcemic in the past with elevated PTH. Further workup with consideration of MEN syndrome with PET-CT recommeded as outpatient, but not yet completed. Calcium today is 9.1.  - Continue to follow calcium. Returned wnl this am. Will likely still need PET-CT when acute issues resolve.   5) Metastatic carcinoid tumor s/p small bowel resection  Follows w Dr. Myna Hidalgo. On sandostatin monthly. According to the most recent notes, her chromogranin A. level has been increasing, which is concerning. According to her oncologist, repeat CT scan may be needed.   6) HLD  Hold statin given mild CK elevation.  Dispo: When SNF bed available  The patient does have a current PCP (KALIA-REYNOLDS, SHELLY, DO), therefore will be requiring [facility]PC follow-up after discharge.    .Services Needed at time of discharge: Y = Yes, Blank = No PT:   OT:   RN:   Equipment:   Other:      LOS: 2 days   Bronson Curb 07/25/2012, 8:26 AM

## 2012-07-25 NOTE — Clinical Social Work Placement (Addendum)
Clinical Social Work Department  CLINICAL SOCIAL WORK PLACEMENT NOTE  07/25/2012  Patient: [name] Account Number: [id number] Admit date: 07/23/12  Clinical Social Worker: Sabino Niemann MSW Date/time: 07/25/2012 11:30 AM  Clinical Social Work is seeking post-discharge placement for this patient at the following level of care: SKILLED NURSING (*CSW will update this form in Epic as items are completed)  07/25/2012 Patient/family provided with [facility] Department of Clinical Social Work's list of facilities offering this level of care within the geographic area requested by the patient (or if unable, by the patient's family).  07/25/2012 Patient/family informed of their freedom to choose among providers that offer the needed level of care, that participate in Medicare, Medicaid or managed care program needed by the patient, have an available bed and are willing to accept the patient.  07/25/2012 Patient/family informed of [facility]' ownership interest in [facility], as well as of the fact that they are under no obligation to receive care at this facility.  PASARR submitted to EDS on 07/25/12 PASARR number received from EDS on 07/26/12 FL2 transmitted to all facilities in geographic area requested by pt/family on 07/25/2012  FL2 transmitted to all facilities within larger geographic area on  Patient informed that his/her managed care company has contracts with or will negotiate with certain facilities, including the following:  Patient/family informed of bed offers received:  Patient chooses bed at [facility] Physician recommends and patient chooses bed at  Patient to be transferred to on 07/27/2012 Patient to be transferred to facility by [facility] The following physician request were entered in Epic:  Additional Comments:

## 2012-07-25 NOTE — Care Management Note (Signed)
CARE MANAGEMENT NOTE 07/25/2012  Patient:  [name], [name]   Account Number:  [id number]  Date Initiated:  07/24/2012  Documentation initiated by:  Letha Cape  Subjective/Objective Assessment:   dx fall,  admit from [facility]     Action/Plan:   Anticipated DC Date:  07/26/2012   Anticipated DC Plan:  SKILLED NURSING FACILITY      DC Planning Services  CM consult      Choice offered to / List presented to:             Status of service:  Completed, signed off Medicare Important Message given?   (If response is "NO", the following Medicare IM given date fields will be blank) Date Medicare IM given:   Date Additional Medicare IM given:    Discharge Disposition:  SKILLED NURSING FACILITY  Per UR Regulation:  Reviewed for med. necessity/level of care/duration of stay  If discussed at Long Length of Stay Meetings, dates discussed:    Comments:  07/24/12 17:02 Letha Cape RN, BSN [phone] patient is from [facility], notes state this is a indep living,CSW referral.

## 2012-07-25 NOTE — Progress Notes (Signed)
Physical medicine and rehabilitation consult was requested. [employer] Medicare would not justify inpatient rehabilitation services for this diagnoses. Recommend home health versus skilled nursing facility

## 2012-07-25 NOTE — Progress Notes (Signed)
Internal Medicine Teaching Service Attending Note Date: 07/25/2012  Patient name: [name]  Medical record number: [id number]  Date of birth: Nov 30, 1945    This patient has been seen and discussed with the house staff. Please see their note for complete details. I concur with their findings.   Miaya Lafontant 07/25/2012, 11:37 AM

## 2012-07-25 NOTE — Progress Notes (Signed)
Physical Therapy Evaluation Patient Details Name: [name] MRN: [id number] DOB: [birth year] Today's Date: 07/25/2012 Time: 5366-4403 PT Time Calculation (min): 21 min  PT Assessment / Plan / Recommendation Clinical Impression  67 yo female admitted post fall, now s/p R hip hemiarthroplasty; Presents with decr functional mobility; Will benefit form PT to maximize functional mobility; Will need postacute rehab    PT Assessment  Patient needs continued PT services    Follow Up Recommendations  SNF    Does the patient have the potential to tolerate intense rehabilitation      Barriers to Discharge Decreased caregiver support      Equipment Recommendations  Rolling walker with 5" wheels    Recommendations for Other Services OT consult   Frequency Min 5X/week    Precautions / Restrictions Precautions Precautions: Fall;Posterior Hip Precaution Comments: Educated in precautions; Will need reinforcement Restrictions Weight Bearing Restrictions: Yes RLE Weight Bearing: Weight bearing as tolerated   Pertinent Vitals/Pain Did not rate, but pain significan'ty limiting mobility; RN informed      Mobility  Bed Mobility Bed Mobility: Supine to Sit;Sitting - Scoot to Edge of Bed Supine to Sit: 1: +2 Total assist Supine to Sit: Patient Percentage: 20% Sitting - Scoot to Edge of Bed: 2: Max assist Details for Bed Mobility Assistance: Cues for technique, and much encouragement to participate; relied on bed pad to scoot to EOB Transfers Transfers: Heritage manager Transfers: 1: +2 Total assist Squat Pivot Transfers: Patient Percentage: 30% Details for Transfer Assistance: Required extensive physical assist for transfer, especially to initiate Ambulation/Gait Ambulation/Gait Assistance: Not tested (comment)    Exercises     PT Diagnosis: Difficulty walking;Acute pain  PT Problem List: Decreased strength;Decreased range of motion;Decreased activity  tolerance;Decreased balance;Decreased mobility;Decreased cognition;Decreased knowledge of use of DME;Decreased safety awareness;Decreased knowledge of precautions;Pain PT Treatment Interventions: DME instruction;Gait training;Functional mobility training;Therapeutic activities;Therapeutic exercise;Balance training;Cognitive remediation;Patient/family education   PT Goals Acute Rehab PT Goals PT Goal Formulation: With patient Time For Goal Achievement: 08/08/12 Potential to Achieve Goals: Good Pt will go Supine/Side to Sit: with min assist PT Goal: Supine/Side to Sit - Progress: Goal set today Pt will go Sit to Supine/Side: with min assist PT Goal: Sit to Supine/Side - Progress: Goal set today Pt will go Sit to Stand: with supervision PT Goal: Sit to Stand - Progress: Goal set today Pt will go Stand to Sit: with supervision PT Goal: Stand to Sit - Progress: Goal set today Pt will Ambulate: 16 - 50 feet;with supervision;with rolling walker PT Goal: Ambulate - Progress: Goal set today Pt will Perform Home Exercise Program: with supervision, verbal cues required/provided PT Goal: Perform Home Exercise Program - Progress: Goal set today  Visit Information  Last PT Received On: 07/25/12 Assistance Needed: +2 PT/OT Co-Evaluation/Treatment: Yes    Subjective Data  Subjective: Very hesitant to get OOB Patient Stated Goal: not stated   Prior Functioning  Home Living Lives With: Alone Available Help at Discharge: Skilled Nursing Facility (planning for dc to snf) Communication Communication: No difficulties    Cognition  Cognition Arousal/Alertness: Awake/alert Behavior During Therapy: Anxious Overall Cognitive Status: History of cognitive impairments - at baseline    Extremity/Trunk Assessment Right Lower Extremity Assessment RLE ROM/Strength/Tone: Deficits RLE ROM/Strength/Tone Deficits: grossly decr strength and range, likely limited by pain; pt hesitant to move this leg,  too Left Lower Extremity Assessment LLE ROM/Strength/Tone: Deficits LLE ROM/Strength/Tone Deficits: Decr AROM and strength, limited by pain postop   Balance  End of Session PT - End of Session Equipment Utilized During Treatment: Gait belt Activity Tolerance: Patient limited by pain Patient left: in chair;with call bell/phone within reach Nurse Communication: Mobility status  GP     Van Clines [facility] [place], [place] [phone]  07/25/2012, 4:33 PM

## 2012-07-25 NOTE — Discharge Summary (Signed)
Internal Medicine Teaching [facility] Discharge Note  Name: [name] MRN: [id number] DOB: September 25, 1945 67 y.o.  Date of Admission: 07/23/2012  9:41 AM Date of Discharge: 07/27/2012 Attending Physician: Inez Catalina, MD  Discharge Diagnosis: Principal Problem:   Closed right hip fracture Active Problems:   CARCINOID TUMOR   HYPERLIPIDEMIA   HYPERTENSION   Chronic kidney disease, stage III (moderate)   Discharge Medications:   Medication List    STOP taking these medications       simvastatin 20 MG tablet  Commonly known as:  ZOCOR      TAKE these medications       acetaminophen 325 MG tablet  Commonly known as:  TYLENOL  Take 2 tablets (650 mg total) by mouth every 6 (six) hours as needed.     amLODipine 10 MG tablet  Commonly known as:  NORVASC  Take 1 tablet (10 mg total) by mouth daily.     cloNIDine 0.2 MG tablet  Commonly known as:  CATAPRES  Take 1 tablet (0.2 mg total) by mouth 3 (three) times daily.     lisinopril 20 MG tablet  Commonly known as:  PRINIVIL,ZESTRIL  Take 1 tablet (20 mg total) by mouth daily.     loperamide 2 MG capsule  Commonly known as:  IMODIUM  Take 1 capsule (2 mg total) by mouth as needed for diarrhea or loose stools.     Multivitamin/Iron Tabs  Take 1 tablet by mouth daily.        Disposition and follow-up:   Ms.Clayton E Mcneeley was discharged from [facility] in Stable condition to SNF.   Please check CK in 1 week, can restart simvastatin if lab within normal limits  Follow-up Appointments: Follow-up Information   Follow up with NORRIS,STEVEN R, MD. Schedule an appointment as soon as possible for a visit in 2 weeks. [phone])    Contact information:   [address] [place] [address] [phone]       Call Johnette Abraham, DO. (call for appointment when discharged from SNF)    Contact information:   [address] [place] [address] [phone]       Schedule an appointment as soon as possible for a visit with Josph Macho, MD.   Contact information:   [address] [place] [address] [phone]      Discharge Orders   Future Appointments Provider Department Dept Phone   08/06/2012 3:00 PM Rachael Fee [facility] [phone]   08/06/2012 3:15 PM Eunice Blase, PA-C [place] [facility] [phone]   08/06/2012 4:00 PM [facility] Nurse [place] [facility] [phone]   Future Orders Complete By Expires     Call MD for:  persistant dizziness or light-headedness  As directed     Call MD for:  redness, tenderness, or signs of infection (pain, swelling, redness, odor or green/yellow discharge around incision site)  As directed     Call MD for:  severe uncontrolled pain  As directed     Call MD for:  temperature >100.4  As directed     Diet - low sodium heart healthy  As directed     Discharge instructions  As directed     Comments:      PT and OT for rehab after R hip fracture and arthroplasty  Full weight bearing  As directed     Increase activity slowly  As directed        Consultations: Treatment Team:  Verlee Rossetti, MD  Procedures Performed:  Dg Hip Complete Right  07/23/2012  *RADIOLOGY REPORT*  Clinical Data: Right hip pain.  RIGHT HIP - COMPLETE 2+ VIEW  Comparison: None.  Findings: The patient has an acute subcapital fracture right hip. No other bony or joint abnormality is identified.  IMPRESSION: Acute subcapital fracture right hip.   Original Report Authenticated By: Holley Dexter, M.D.    Mr Brain Wo Contrast  07/23/2012  *RADIOLOGY REPORT*  Clinical Data: Right facial droop and extremity weakness.  Slurred speech.  MRI HEAD WITHOUT CONTRAST  Technique:  Multiplanar, multiecho pulse sequences of the brain and surrounding structures were obtained according to standard  protocol without intravenous contrast.  Comparison: CT head without contrast 05/11/2003.  Findings: A remote hemorrhagic infarcts of the left putamen is evident.  Diffuse remote white matter infarction is also noted on the left.  No acute infarct or hemorrhage is evident.  Moderate generalized atrophy and white matter disease is present bilaterally. Additional remote lacunar infarction noted in the basal ganglia, more prominent left than right thalami.  Wallerian degeneration is evident within the left cerebral peduncle.  Remote lacunar infarcts are present in the cerebellum bilaterally, worse on the left.  Flow is present in the major intracranial arteries.  The globes and orbits are intact.  The paranasal sinuses and mastoid air cells are clear.  IMPRESSION:  1.  No acute intracranial abnormality. 2.  Remote hemorrhagic infarct of the left basal ganglia with associated wallerian degeneration. 3.  Age advanced atrophy and extensive white matter disease likely reflects the sequelae of chronic microvascular ischemia.   Original Report Authenticated By: Marin Roberts, M.D.    Dg Pelvis Portable  07/24/2012  *RADIOLOGY REPORT*  Clinical Data: Postoperative tube placement.  PORTABLE PELVIS  Comparison: None.  Findings: Single oblique view of the right hip performed portably reveals total right hip replacement in satisfactory position without complication noted.  IMPRESSION: Post right hip replacement in satisfactory position without complication noted.   Original Report Authenticated By: Lacy Duverney, M.D.    Dg Foot Complete Right  07/23/2012  *RADIOLOGY REPORT*  Clinical Data: Extremity weakness.  Pain.  RIGHT FOOT COMPLETE - 3+ VIEW  Comparison: None.  Findings: No acute bony or joint abnormality is identified.  No erosion or notable degenerative change is seen.  Bones appear somewhat osteopenic.  IMPRESSION: No acute finding.   Original Report Authenticated By: Holley Dexter, M.D.     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  Patient is currently living in an independent living facility. She alternately reports that she has an aide it comes to assist her daily and other times tells [place] that somebody only comes once per week. She uses a walker at baseline to ambulate. Apparently, sister found the patient lying down on the floor of her apartment this morning. The sister reports the patient has become weaker and is having much difficult time taking care of herself.  Per the patient, she decided to sleep on the floor last night for reasons she cannot recall. She reports this morning that her left foot was swollen tender but has gotten better. She  says that she has somebody that helps her cook and bathe, but is confused about how often she sees this person during the week.  Patient complains of right ankle and foot pain. She denies any previous injury and is not sure why she is having the pain.  She denies any fever, chills, chest pain, dizziness, shortness of breath, cough, abdominal pain, nausea, vomiting, diarrhea, dysuria.   Hospital Course by problem list: # Fall with R hip fracture  Likely due to progressive deconditioning, UTI may be contributing. No new acute infarct on MRI brain. Plain film R hip showed acute subcapital fracture. Underwent R hip arthroplasty on 07/24/12 by Dr. Ranell Patrick (ortho). Tolerated procedure well with no perioperative or postoperative complications. To be discharged to SNF, full weight bearing, with PT/OT. F/u with ortho in 2 weeks.    #Anemia  Two days postop with Hb 10.3-->9.2-->8.0 morning of discharge. Patient denies symptoms. Repeat Hb prior to d/c was 7.9.   # UTI  Had 21-50 WBC/hpf in urine on arrival, but final culture w only 9,000  colonies, no organism identified. No fever, chills, or leukocytosis. Ceftriaxone was started on admission but discontinued on 07/25/12 when culture results returned.   # HTN, uncontrolled  Patient appears to have isolated systolic hypertension with a blood pressure 216/84 in the ED, which decreased to 173/61 after she was given her home medications. This am BP is 109/43  She is supposed to be taking lisinopril, clonidine, amlodipine.  -Continue home antihypertensives   # Hypercalcemia, possible hyperparathyroidism  Noted to be hypercalcemic in the past with elevated PTH. Further workup with consideration of MEN syndrome with PET-CT recommeded as outpatient, but not yet completed.  Calcium today on admission was 11.0 but trended down to 9.1 with IVF. Will likely still need PET-CT when acute issues resolve.   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 Myna Hidalgo. On sandostatin monthly. According to the most recent notes, her chromogranin A. level has been increasing, which is concerning. According to her oncologist, repeat CT scan may be needed.   # HLD  Held statin given mild CK elevation on admission (1800). Restarted on d/c.  Discharge Vitals:  BP 136/70  Pulse 76  Temp(Src) 98.7 F (37.1 C) (Oral)  Resp 18  Ht 5\' 2"  (1.575 m)  Wt 113 lb 6.4 oz (51.438 kg)  BMI 20.74 kg/m2  SpO2 100%  Discharge Labs:  Results for orders placed during the hospital encounter of 07/23/12 (from the past 24 hour(s))  CLOSTRIDIUM DIFFICILE BY PCR     Status: None   Collection Time    07/26/12  6:51 PM      Result Value Range   C difficile by pcr NEGATIVE  NEGATIVE  CBC     Status: Abnormal   Collection Time    07/27/12  6:15 AM      Result Value Range   WBC 4.9  4.0 - 10.5 K/uL   RBC 2.59 (*) 3.87 - 5.11 MIL/uL   Hemoglobin 7.9 (*) 12.0 - 15.0 g/dL   HCT 40.9 (*) 81.1 - 91.4 %   MCV 92.3  78.0 - 100.0 fL   MCH 30.5  26.0 - 34.0 pg   MCHC 33.1  30.0 - 36.0 g/dL   RDW 78.2   95.6 - 21.3 %   Platelets 172  150 - 400 K/uL    Signed: Bronson Curb 07/27/2012, 11:36 AM   Time Spent on Discharge: 35 min Services Ordered on  Discharge: SNF Equipment Ordered on Discharge: none

## 2012-07-25 NOTE — Progress Notes (Signed)
   Subjective: 1 Day Post-Op Procedure(s) (LRB): ARTHROPLASTY BIPOLAR HIP (Right)  Pt doing well with minimal pain to right hip  Patient reports pain as mild.  Objective:   VITALS:   Filed Vitals:   07/25/12 1113  BP: 140/68  Pulse:   Temp:   Resp:    Right hip wound healing well Neurologically intact distally  LABS  Recent Labs  07/23/12 1035 07/24/12 1853 07/25/12 0500  HGB 12.9 10.3* 9.2*  HCT 36.5 30.2* 26.7*  WBC 7.7 9.1 6.4  PLT 215 194 180     Recent Labs  07/23/12 1035 07/24/12 0500 07/24/12 1853 07/25/12 0500  NA 138 146*  --  140  K 3.9 3.6  --  3.5  BUN 26* 30*  --  22  CREATININE 0.98 1.05 0.99 0.99  GLUCOSE 106* 107*  --  124*     Assessment/Plan: 1 Day Post-Op Procedure(s) (LRB): ARTHROPLASTY BIPOLAR HIP (Right)   Pt doing well Pain under control Continue PT/OT D/c planning to SNF   Brad Dixon, MPAS, PA-C  07/25/2012, 12:32 PM

## 2012-07-25 NOTE — Evaluation (Signed)
Occupational Therapy Evaluation Patient Details Name: [name] MRN: [id number] DOB: May 26, 1945 Today's Date: 07/25/2012 Time: 4098-1191 OT Time Calculation (min): 18 min  OT Assessment / Plan / Recommendation Clinical Impression  Pt admitted s/p fall and is now s/p R hip hemiarthroplasty. PMH of HTN, metastatic carcinoid cancer s/p bowel resection, depression, HLD, h/o CVA, CKD. Will continue to follow acutely in order to address below problem list. Recommending SNF for d/c planning.    OT Assessment  Patient needs continued OT Services    Follow Up Recommendations  SNF    Barriers to Discharge      Equipment Recommendations  3 in 1 bedside comode    Recommendations for Other Services    Frequency  Min 2X/week    Precautions / Restrictions Precautions Precautions: Fall;Posterior Hip Precaution Comments: Educated in precautions; Will need reinforcement Restrictions Weight Bearing Restrictions: Yes RLE Weight Bearing: Weight bearing as tolerated   Pertinent Vitals/Pain See vitals    ADL  Grooming: Performed;Wash/dry face;Set up Where Assessed - Grooming: Supported sitting Upper Body Bathing: Simulated;Set up Where Assessed - Upper Body Bathing: Supported sitting Lower Body Bathing: Simulated;Maximal assistance Where Assessed - Lower Body Bathing: Supported sitting Upper Body Dressing: Simulated;Minimal assistance Where Assessed - Upper Body Dressing: Supported sitting Lower Body Dressing: +1 Total assistance;Performed (donned socks) Where Assessed - Lower Body Dressing: Supported sitting Toilet Transfer: Simulated;+2 Total assistance Toilet Transfer: Patient Percentage: 30% Statistician Method: Ambulance person:  (bed to chair) Equipment Used: Gait belt Transfers/Ambulation Related to ADLs: +2 total assist for squat pivot from bed to chair.  ADL Comments: Pt limited by pain and required max encouragement to perform mobility tasks.     OT Diagnosis: Generalized weakness;Acute pain;Cognitive deficits  OT Problem List: Decreased strength;Decreased activity tolerance;Impaired balance (sitting and/or standing);Decreased cognition;Decreased knowledge of use of DME or AE;Decreased knowledge of precautions;Pain OT Treatment Interventions: Self-care/ADL training;DME and/or AE instruction;Therapeutic activities;Patient/family education;Balance training;Cognitive remediation/compensation   OT Goals Acute Rehab OT Goals OT Goal Formulation: With patient Time For Goal Achievement: 08/01/12 Potential to Achieve Goals: Good ADL Goals Pt Will Transfer to Toilet: with mod assist;Stand pivot transfer;with DME;3-in-1;Maintaining hip precautions ADL Goal: Toilet Transfer - Progress: Goal set today Miscellaneous OT Goals Miscellaneous OT Goal #1: Pt will perform bed mobility with min assist as precursor for EOB ADLs. OT Goal: Miscellaneous Goal #1 - Progress: Goal set today Miscellaneous OT Goal #2: Pt will be able to verbalize 3/3 post. hip precautions with 1 verbal cue. OT Goal: Miscellaneous Goal #2 - Progress: Goal set today Miscellaneous OT Goal #3: Pt will be able to perform full sit<>stand with max assist as precursor for functional toilet transfer. OT Goal: Miscellaneous Goal #3 - Progress: Goal set today  Visit Information  Last OT Received On: 07/25/12 Assistance Needed: +2 PT/OT Co-Evaluation/Treatment: Yes    Subjective Data      Prior Functioning     Home Living Lives With: Alone Available Help at Discharge: Skilled Nursing Facility Type of Home: Assisted living Bathroom Shower/Tub: Tub/shower unit Bathroom Toilet: Handicapped height Home Adaptive Equipment: Walker - rolling Prior Function Level of Independence: Needs assistance Needs Assistance: Bathing;Meal Prep Bath: Minimal Meal Prep: Total Comments: Pt reports she walks to the cafeteria for all her meals. Communication Communication: No difficulties          Vision/Perception     Cognition  Cognition Arousal/Alertness: Awake/alert Behavior During Therapy: Anxious Overall Cognitive Status: History of cognitive impairments - at baseline  Extremity/Trunk Assessment Right Upper Extremity Assessment RUE ROM/Strength/Tone: [facility] for tasks assessed;Deficits RUE ROM/Strength/Tone Deficits: Tends to hold RUE in flexed postion. paresis from old CVA. Able to use [facility] for tasks assessed. Left Upper Extremity Assessment LUE ROM/Strength/Tone: WFL for tasks assessed Right Lower Extremity Assessment RLE ROM/Strength/Tone: Deficits RLE ROM/Strength/Tone Deficits: grossly decr strength and range, likely limited by pain; pt hesitant to move this leg, too Left Lower Extremity Assessment LLE ROM/Strength/Tone: Deficits LLE ROM/Strength/Tone Deficits: Decr AROM and strength, limited by pain postop     Mobility Bed Mobility Bed Mobility: Supine to Sit;Sitting - Scoot to Edge of Bed Supine to Sit: 1: +2 Total assist Supine to Sit: Patient Percentage: 20% Sitting - Scoot to Edge of Bed: 2: Max assist Details for Bed Mobility Assistance: Cues for technique, and much encouragement to participate; relied on bed pad to scoot to EOB Transfers Details for Transfer Assistance: Unable to achieve full stand.  Performed squat pivot transfer. Required extensive physical assist for transfer, especially to initiate.     Exercise     Balance     End of Session OT - End of Session Equipment Utilized During Treatment: Gait belt Activity Tolerance: Patient limited by pain Patient left: in chair;with call bell/phone within reach Nurse Communication: Mobility status  GO    07/25/2012 Cipriano Mile OTR/L Pager [phone] Office [phone]  Cipriano Mile 07/25/2012, 4:49 PM

## 2012-07-25 NOTE — Progress Notes (Signed)
Clinical Social Work Department  BRIEF PSYCHOSOCIAL ASSESSMENT  Patient: [name] Account Number: [id number]   Admit date: 07/23/12 Clinical Social Worker Sabino Niemann, MSW Date/Time:  Referred by: Physician Date Referred: 07/24/12 Referred for   SNF Placement   Other Referral:  Interview type: Patient and patient's sister Other interview type: PSYCHOSOCIAL DATA  Living Status: alone in an independent living facility Admitted from facility:  Level of care:  Primary support name: Bryant,Wilma Primary support relationship to patient: Sister Degree of support available:  Fair  CURRENT CONCERNS  Current Concerns   Post-Acute Placement   Other Concerns:  SOCIAL WORK ASSESSMENT / PLAN  CSW met with pt re: PT recommendation for SNF.   Pt lives alone in a 1 bedroom apartment. Patient reports she is going to think about SNF placement  CSW explained placement process and answered questions.   Pt reports no preference at this time and is somewhat difficult to understand    CSW completed FL2 and initiated SNF search.     Assessment/plan status: Information/Referral to [facility]  Other assessment/ plan:  Information/referral to community resources:  SNF     PATIENT'S/FAMILY'S RESPONSE TO PLAN OF CARE:  Pt  reports she is going to think about  [facility] SNF in order to increase strength and independence with mobility prior to returning home  Pt somewhat verbalized understanding of placement process and appreciation for CSW assist.   Sabino Niemann, MSW [phone]

## 2012-07-25 NOTE — Op Note (Signed)
[name], [name]             ACCOUNT NO.:  [id number]  MEDICAL RECORD NO.:  [id number]  LOCATION:  5N31C                        FACILITY:  [facility]  PHYSICIAN:  [name], M.D. DATE OF BIRTH:  07/13/1945  DATE OF PROCEDURE:  07/24/2012 DATE OF DISCHARGE:                              OPERATIVE REPORT   PREOPERATIVE DIAGNOSIS:  Displaced right femoral neck fracture.  POSTOPERATIVE DIAGNOSIS:  Displaced right femoral neck fracture.  PROCEDURE PERFORMED:  Right hip hemiarthroplasty using DePuy Tri-Lock system.  SURGEON:  Jillian Balls. Ranell Patrick, MD  ASSISTANT:  [name]. Dixon, PA-C, who was scrubbed the entire procedure and necessary for satisfactory completion of surgery.  ANESTHESIA:  General anesthesia was used.  ESTIMATED BLOOD LOSS:  Less than 100 mL.  FLUID REPLACEMENT:  1000 mL crystalloid.  INSTRUMENT COUNTS:  Correct.  COMPLICATIONS:  There were no complications.  ANTIBIOTICS:  Perioperative antibiotics were given.  INDICATIONS:  The patient is a 67 year old female, who had an unwitnessed fall, injuring her right hip.  The patient presented to [facility] Emergency Department, was diagnosed with a displaced femoral neck fracture admitted to Internal Medicine, cleared from a medical standpoint for surgery.  I discussed this case and the diagnosis with the patient's sister, as the patient has some dementia and mentioned that the patient has an unstable femoral neck fracture that would prevent mobilization unless that were fixed with a hemiarthroplasty. The patient's power-of-attorney and sister elected to proceed with surgical management.  The patient was amenable with that as well. Informed consent was obtained.  DESCRIPTION OF PROCEDURE:  After adequate level of anesthesia achieved, the patient was positioned in the left lateral decubitus position with the right hip up, right hip sterilely prepped and draped in the usual manner.  Time-out called.  We then  entered the hip using the posterior approach with a Kocher-Langenbeck incision.  Dissection down through subcutaneous tissues.  We identified the tensor fascia lata divided that in line with skin incision.  We then identified the gluteus medius and retracted that with a Hibbs retractor.  We then released the short external rotators and posterior capsule off the posterior aspect of the femur with progressive internal rotation.  We presented the femoral neck for resection from 1 fingerbreadth above the lesser trochanter.  We used a neck resection guide to make the neck cut with an oscillating saw. Removed remaining comminuted neck fragments.  We then went ahead and subluxed the femur anteriorly and then used a corkscrew 2, and ice-cream scoop to removed the femoral head which was sized to a size 44.  We thoroughly irrigated, removed the remaining ligamentum teres.  We then went ahead and prepared the femur with sequential broaching for the Tri- Lock system which is a broach only.  We broached up to a size 3 stem. We reduced the hip with the 0 neck adapter and a 44 head, had nice leg lengths, soft tissue balancing, and stability at both 90 degrees of flexion, 90 degrees of internal rotation.  We thoroughly irrigated.  We removed the trial components.  We impacted the real size 3 Tri-Lock stem into position with appropriate anteversion.  We then placed the 0 neck adaptor in  the head.  Monopolar head impacted on the back table and placed that on the trunnion for the stem and impacted that in position. We had a nice stable hip.  We reduced the hip.  We were happy with leg length, soft tissue balancing, and stability.  We thoroughly irrigated and closed the capsule with interrupted #1 Ethibond followed by 0 Vicryl for the tensor fascia lata, we did interrupt figure-of-eight and then also some running for the muscular portion and then did subcutaneous closure 2-0 Vicryl, and staples for skin.   Sterile bandage was applied and a knee immobilizer.  The patient tolerated the procedure well.     [name], M.D.     SRN/MEDQ  D:  07/24/2012  T:  07/25/2012  Job:  [id number]

## 2012-07-25 NOTE — Anesthesia Postprocedure Evaluation (Signed)
  Anesthesia Post-op Note  Patient: [name]  Procedure(s) Performed: Procedure(s): ARTHROPLASTY BIPOLAR HIP (Right)  Patient Location: Nursing Unit  Anesthesia Type:General  Level of Consciousness: awake, alert  and oriented  Airway and Oxygen Therapy: Patient Spontanous Breathing and Patient connected to nasal cannula oxygen  Post-op Pain: moderate  Post-op Assessment: Post-op Vital signs reviewed, Patient's Cardiovascular Status Stable, Respiratory Function Stable, Patent Airway, No signs of Nausea or vomiting, Adequate PO intake and Pain level controlled  Post-op Vital Signs: Reviewed and stable  Complications: No apparent anesthesia complications

## 2012-07-26 ENCOUNTER — Other Ambulatory Visit: Payer: Self-pay | Admitting: Internal Medicine

## 2012-07-26 ENCOUNTER — Telehealth: Payer: Self-pay | Admitting: Hematology & Oncology

## 2012-07-26 LAB — CBC
HCT: 23.4 % — ABNORMAL LOW (ref 36.0–46.0)
Hemoglobin: 8 g/dL — ABNORMAL LOW (ref 12.0–15.0)
MCHC: 34.2 g/dL (ref 30.0–36.0)
RBC: 2.58 MIL/uL — ABNORMAL LOW (ref 3.87–5.11)

## 2012-07-26 LAB — BASIC METABOLIC PANEL
BUN: 21 mg/dL (ref 6–23)
CO2: 23 mEq/L (ref 19–32)
Glucose, Bld: 104 mg/dL — ABNORMAL HIGH (ref 70–99)
Potassium: 3.4 mEq/L — ABNORMAL LOW (ref 3.5–5.1)
Sodium: 145 mEq/L (ref 135–145)

## 2012-07-26 LAB — PROTIME-INR: INR: 1.13 (ref 0.00–1.49)

## 2012-07-26 MED ORDER — ACETAMINOPHEN 325 MG PO TABS: 650.0000 mg | ORAL_TABLET | Freq: Four times a day (QID) | ORAL | Status: DC | PRN

## 2012-07-26 NOTE — Progress Notes (Addendum)
   Subjective: 2 Days Post-Op Procedure(s) (LRB): ARTHROPLASTY BIPOLAR HIP (Right)  Pt sitting up comfortably in no acute distress Minimal to no pain to right hip today  Patient reports pain as mild.  Objective:   VITALS:   Filed Vitals:   07/26/12 0755  BP:   Pulse:   Temp:   Resp: 18   Right hip incision healing well Neurologically intact distally  LABS  Recent Labs  07/24/12 1853 07/25/12 0500 07/26/12 0610  HGB 10.3* 9.2* 8.0*  HCT 30.2* 26.7* 23.4*  WBC 9.1 6.4 5.3  PLT 194 180 150     Recent Labs  07/24/12 0500 07/24/12 1853 07/25/12 0500 07/26/12 0610  NA 146*  --  140 145  K 3.6  --  3.5 3.4*  BUN 30*  --  22 21  CREATININE 1.05 0.99 0.99 1.01  GLUCOSE 107*  --  124* 104*     Assessment/Plan: 2 Days Post-Op Procedure(s) (LRB): ARTHROPLASTY BIPOLAR HIP (Right) PT/OT Pt stable for d/c to SNF once medically stable F/u with Dr. Ranell Patrick in 2 weeks   Alphonsa Overall, MPAS, PA-C  07/26/2012, 9:32 AM   I have seen patient and agree with the plan for transfer to rehab.snf when bed available.  She is looking great and recovering well. Verlee Rossetti, MD

## 2012-07-26 NOTE — Telephone Encounter (Signed)
Fax Medical Records through Epic today to: [employer]       Attn: Quality Operations, Compliance & Accreditation       c/o Burman Nieves       [address]       [place], [place]  [address]  Medical  Records  and medication list requested from 2013 to present   CONSENT COPY SCANNED

## 2012-07-26 NOTE — Progress Notes (Signed)
Physical Therapy Treatment Patient Details Name: [name] MRN: [id number] DOB: 1945/11/10 Today's Date: 07/26/2012 Time: 2130-8657 PT Time Calculation (min): 23 min  PT Assessment / Plan / Recommendation Comments on Treatment Session  Pt. had been incontinent of BM in bed and did not appear to have an awareness of this.  She has great difficulty with bed mobility and transfers at this point and needs 2 assist despite her small size.      Follow Up Recommendations  SNF     Does the patient have the potential to tolerate intense rehabilitation     Barriers to Discharge        Equipment Recommendations  Rolling walker with 5" wheels    Recommendations for Other Services OT consult  Frequency Min 5X/week   Plan Discharge plan remains appropriate;Frequency remains appropriate    Precautions / Restrictions Precautions Precautions: Fall;Posterior Hip Precaution Comments: reinforced precautions, pt. not able to state or generalize; needs ongoing reinforcement Restrictions Weight Bearing Restrictions: Yes RLE Weight Bearing: Weight bearing as tolerated   Pertinent Vitals/Pain See vitals tab     Mobility  Bed Mobility Bed Mobility: Supine to Sit;Sitting - Scoot to Edge of Bed Supine to Sit: 1: +2 Total assist Supine to Sit: Patient Percentage: 20% Sitting - Scoot to Edge of Bed: 2: Max assist Details for Bed Mobility Assistance: pt. needed assist and use of bed pad to come to edge of bed from supine Transfers Transfers: Sit to Stand;Stand to Sit;Stand Pivot Transfers Sit to Stand: 1: +2 Total assist;From bed Sit to Stand: Patient Percentage: 30% Stand to Sit: 1: +2 Total assist;To chair/3-in-1 Stand to Sit: Patient Percentage: 30% Stand Pivot Transfers: 1: +2 Total assist Stand Pivot Transfers: Patient Percentage: 20% Details for Transfer Assistance: Pt. unable to initiate and needed 2 total asssist to stand and transfer.  she had great difficulty moving her LEs.  Pt.  had incontinence of stool in bed and nursing tech cleaned pt while PT supported her in standing.  Pt. unable to stand fully erect. Ambulation/Gait Ambulation/Gait Assistance: Not tested (comment);Other (comment) (pt. unable)    Exercises     PT Diagnosis:    PT Problem List:   PT Treatment Interventions:     PT Goals Acute Rehab PT Goals PT Goal Formulation: With patient Pt will go Supine/Side to Sit: with min assist PT Goal: Supine/Side to Sit - Progress: Progressing toward goal Pt will go Sit to Stand: with supervision PT Goal: Sit to Stand - Progress: Progressing toward goal Pt will go Stand to Sit: with supervision PT Goal: Stand to Sit - Progress: Progressing toward goal  Visit Information  Last PT Received On: 07/26/12 Assistance Needed: +2    Subjective Data  Subjective: Pt. speaking but difficult to fully understand   Cognition  Cognition Arousal/Alertness: Awake/alert Behavior During Therapy: Anxious Overall Cognitive Status: History of cognitive impairments - at baseline    Balance     End of Session PT - End of Session Equipment Utilized During Treatment: Gait belt Activity Tolerance: Patient limited by fatigue;Patient limited by pain Patient left: in chair;with call bell/phone within reach Nurse Communication: Mobility status;Other (comment) (need for +2 assist to transfer)   GP     Ferman Hamming 07/26/2012, 12:04 PM Weldon Picking PT Acute Rehab Services [phone] [place] [phone]

## 2012-07-26 NOTE — Progress Notes (Signed)
Subjective: POD2 s/p R hip arthroplasty. No complaints this am. Denies postoperative pain. Denies CP, SOB, N/V, dizziness, abdominal pain, diarrhea.  Objective: Vital signs in last 24 hours: Filed Vitals:   07/25/12 2245 07/26/12 0550 07/26/12 0706 07/26/12 0755  BP: 122/55 109/43    Pulse:  54    Temp:      TempSrc:      Resp:  16  18  Height:      Weight:   114 lb (51.71 kg)   SpO2:  99%     Weight change:  No intake or output data in the 24 hours ending 07/26/12 1013 Vitals reviewed.  General: Lying in bed, talking on telephone. Animated. HEENT: PERRL, EOMI, no scleral icterus  Cardiac: RRR, no rubs, murmurs or gallops  Pulm: CTAB Abd: Surgical scars noted. Soft, nontender, nondistended, BS present  Ext: R hip incision site bandaged, small amount of blood seen on bandage, no hematoma.. Can wiggle R toes. Pulses are intact distally in bilateral lower extremities. Neuro: Patient is tangential and easily distractible   Lab Results: Basic Metabolic Panel:  Recent Labs Lab 07/25/12 0500 07/26/12 0610  NA 140 145  K 3.5 3.4*  CL 106 113*  CO2 27 23  GLUCOSE 124* 104*  BUN 22 21  CREATININE 0.99 1.01  CALCIUM 9.1 9.1   CBC:  Recent Labs Lab 07/23/12 1035  07/25/12 0500 07/26/12 0610  WBC 7.7  < > 6.4 5.3  NEUTROABS 6.4  --   --   --   HGB 12.9  < > 9.2* 8.0*  HCT 36.5  < > 26.7* 23.4*  MCV 88.4  < > 89.9 90.7  PLT 215  < > 180 150  < > = values in this interval not displayed. Cardiac Enzymes:  Recent Labs Lab 07/23/12 1035 07/23/12 1511 07/23/12 2016 07/24/12 0143 07/24/12 0500 07/24/12 0728  CKTOTAL 1858* 1660*  --   --  1004*  --   TROPONINI  --   --  <0.30 <0.30  --  <0.30   Thyroid Function Tests:  Recent Labs Lab 07/23/12 2016  TSH 1.833   Urine Drug Screen: Drugs of Abuse     Component Value Date/Time   LABOPIA NONE DETECTED 07/23/2012 1317   COCAINSCRNUR NONE DETECTED 07/23/2012 1317   COCAINSCRNUR NEG 03/04/2008 1950   LABBENZ NONE  DETECTED 07/23/2012 1317   LABBENZ NEG 03/04/2008 1950   AMPHETMU NONE DETECTED 07/23/2012 1317   AMPHETMU NEG 03/04/2008 1950   THCU NONE DETECTED 07/23/2012 1317   LABBARB NONE DETECTED 07/23/2012 1317    Alcohol Level:  Recent Labs Lab 07/23/12 2016  ETH <11   Urinalysis:  Recent Labs Lab 07/23/12 1317  COLORURINE YELLOW  LABSPEC 1.029  PHURINE 5.0  GLUCOSEU NEGATIVE  HGBUR NEGATIVE  BILIRUBINUR SMALL*  KETONESUR NEGATIVE  PROTEINUR >300*  UROBILINOGEN 1.0  NITRITE NEGATIVE  LEUKOCYTESUR MODERATE*   Studies/Results: Dg Pelvis Portable  07/24/2012  *RADIOLOGY REPORT*  Clinical Data: Postoperative tube placement.  PORTABLE PELVIS  Comparison: None.  Findings: Single oblique view of the right hip performed portably reveals total right hip replacement in satisfactory position without complication noted.  IMPRESSION: Post right hip replacement in satisfactory position without complication noted.   Original Report Authenticated By: Lacy Duverney, M.D.    Medications: I have reviewed the patient's current medications. Scheduled Meds: . amLODipine  10 mg Oral Daily  . cloNIDine  0.2 mg Oral TID  . enoxaparin (LOVENOX) injection  30 mg Subcutaneous Q24H  .  lisinopril  20 mg Oral Daily  . mupirocin ointment  1 application Nasal BID   Continuous Infusions: . sodium chloride 75 mL/hr at 07/26/12 0636  . sodium chloride 50 mL/hr at 07/24/12 2000  . lactated ringers 50 mL/hr at 07/24/12 1439   PRN Meds:.acetaminophen, acetaminophen, HYDROcodone-acetaminophen, menthol-cetylpyridinium, metoCLOPramide (REGLAN) injection, metoCLOPramide, morphine injection, ondansetron (ZOFRAN) IV, ondansetron, phenol  Assessment/Plan: Assessment & Plan by Problem:  67 yo female presents w R subcapital hip fx after being found down.  # Fall with R hip fracture, s/p R hip arthroplasty Likely due to progressive deconditioning. No new acute infarct on MRI brain. Plain film R hip showed acute subcapital  fracture, now POD2 s/p R hip arthroplasty. - SNF pending - F/u ortho in 2 weeks   #Anemia  Likely postoperative anemia. Hb 10.3-->9.2-->8.0 this am. Patient denies symptoms.  - Recheck H/H at 1300 for Hb stability  # UTI Had 21-50 WBC/hpf in urine on arrival, but final culture w only 9,000 colonies, no organism identified. - Stopped ceftriaxone 07/25/12  # HTN, uncontrolled  Patient appears to have isolated systolic hypertension with a blood pressure 216/84 in the ED, which decreased to 173/61 after she was given her home medications. This am BP is 109/43 She is supposed to be taking lisinopril, clonidine, amlodipine.  -Continue home antihypertensives   # Hypercalcemia, possible hyperparathyroidism  Noted to be hypercalcemic in the past with elevated PTH. Further workup with consideration of MEN syndrome with PET-CT recommeded as outpatient, but not yet completed. Calcium today is 9.1.  - Continue to follow calcium. Returned wnl this am. Will likely still need PET-CT when acute issues resolve.   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 Myna Hidalgo. On sandostatin monthly. According to the most recent notes, her chromogranin A. level has been increasing, which is concerning. According to her oncologist, repeat CT scan may be needed.   # HLD  Hold statin given mild CK elevation on admission. To restart on d/c  Dispo: When SNF bed available. FL2 signed  The patient does have a current PCP (KALIA-REYNOLDS, SHELLY, DO), therefore will be requiring OPC follow-up after discharge.    .Services Needed at time of discharge: Y = Yes, Blank = No PT:   OT:   RN:   Equipment:   Other:     LOS: 3 days   Bronson Curb 07/26/2012, 10:13 AM

## 2012-07-26 NOTE — Progress Notes (Signed)
Internal Medicine Teaching Service Attending Note Date: 07/26/2012  Patient name: [name]  Medical record number: [id number]  Date of birth: [birth year]    This patient has been seen and discussed with the house staff. Please see their note for complete details. I concur with their findings with the following additions/corrections:  Pending placement in SNF.   MULLEN, EMILY 07/26/2012, 9:17 PM

## 2012-07-27 DIAGNOSIS — S72009D Fracture of unspecified part of neck of unspecified femur, subsequent encounter for closed fracture with routine healing: Secondary | ICD-10-CM

## 2012-07-27 DIAGNOSIS — D499 Neoplasm of unspecified behavior of unspecified site: Secondary | ICD-10-CM

## 2012-07-27 LAB — CBC
HCT: 23.9 % — ABNORMAL LOW (ref 36.0–46.0)
Hemoglobin: 7.9 g/dL — ABNORMAL LOW (ref 12.0–15.0)
MCV: 92.3 fL (ref 78.0–100.0)
RBC: 2.59 MIL/uL — ABNORMAL LOW (ref 3.87–5.11)
WBC: 4.9 10*3/uL (ref 4.0–10.5)

## 2012-07-27 MED ORDER — LOPERAMIDE HCL 2 MG PO CAPS: 2.0000 mg | ORAL_CAPSULE | ORAL | Status: DC | PRN

## 2012-07-27 MED ORDER — POTASSIUM CHLORIDE CRYS ER 20 MEQ PO TBCR
40.0000 meq | EXTENDED_RELEASE_TABLET | Freq: Once | ORAL | Status: AC
  Administered 2012-07-27: 40 meq via ORAL
  Filled 2012-07-27: qty 2

## 2012-07-27 MED ORDER — LOPERAMIDE HCL 2 MG PO CAPS
2.0000 mg | ORAL_CAPSULE | ORAL | Status: DC | PRN
Start: 2012-07-27 — End: 2012-07-27
  Administered 2012-07-27: 2 mg via ORAL
  Filled 2012-07-27: qty 1

## 2012-07-27 NOTE — Progress Notes (Signed)
Internal Medicine Attending  Date: 07/27/2012  Patient name: [name] Medical record number: [id number] Date of birth: [birth year] Age: 67 y.o. Gender: female  I saw and evaluated the patient. I reviewed the resident's note by Dr. Heloise Beecham and I agree with the resident's findings and plans as documented in her progress note.  She is doing well post-operatively and we are working on rehabilitation placement.

## 2012-07-27 NOTE — Progress Notes (Signed)
Physical Therapy Treatment Patient Details Name: [name] MRN: [id number] DOB: 03-14-1946 Today's Date: 07/27/2012 Time: 1026-1049 PT Time Calculation (min): 23 min  PT Assessment / Plan / Recommendation Comments on Treatment Session  Pt. making good gains with mobility today but continues with confusion and decreased awareness of her setting and situation.    Follow Up Recommendations  SNF     Does the patient have the potential to tolerate intense rehabilitation     Barriers to Discharge        Equipment Recommendations  Rolling walker with 5" wheels    Recommendations for Other Services    Frequency Min 5X/week   Plan Discharge plan remains appropriate;Frequency remains appropriate    Precautions / Restrictions Precautions Precautions: Fall;Posterior Hip Precaution Comments: reinforced precautions, pt. not able to state or generalize; needs ongoing reinforcement Restrictions Weight Bearing Restrictions: Yes RLE Weight Bearing: Weight bearing as tolerated   Pertinent Vitals/Pain Denies pain    Mobility  Bed Mobility Bed Mobility: Supine to Sit;Sitting - Scoot to Edge of Bed Supine to Sit: 1: +2 Total assist Supine to Sit: Patient Percentage: 40% Sitting - Scoot to Edge of Bed: 3: Mod assist Details for Bed Mobility Assistance: bed pad used to assist pt. in transition to sitting with 2 assist; mod assist needed  to scoot to edge of bed , improved from yesterday Transfers Transfers: Sit to Stand;Stand to Sit Sit to Stand: 1: +2 Total assist;From bed Sit to Stand: Patient Percentage: 40% Stand to Sit: 1: +2 Total assist;To chair/3-in-1 Stand to Sit: Patient Percentage: 40% Details for Transfer Assistance: Pt. able to follwo simple directions better today for step by step to stand, needed assist to rise to standing postion.   Ambulation/Gait Ambulation/Gait Assistance: 1: +2 Total assist Ambulation/Gait: Patient Percentage: 60% Ambulation Distance (Feet): 10  Feet Assistive device: Rolling walker Ambulation/Gait Assistance Details: Assist needed to propel RW and vcs for step by step instruction in moving feet.  Also vcs for hip precautions and widening base of support. Gait Pattern: Step-to pattern;Trunk flexed;Narrow base of support;Decreased stance time - right;Decreased stride length    Exercises Total Joint Exercises Ankle Circles/Pumps: AROM;Both;10 reps Quad Sets: AROM;Right;10 reps Long Arc Quad: AROM;Right;10 reps;Seated   PT Diagnosis:    PT Problem List:   PT Treatment Interventions:     PT Goals Acute Rehab PT Goals Pt will go Supine/Side to Sit: with min assist PT Goal: Supine/Side to Sit - Progress: Progressing toward goal Pt will go Sit to Stand: with supervision PT Goal: Sit to Stand - Progress: Progressing toward goal Pt will go Stand to Sit: with supervision PT Goal: Stand to Sit - Progress: Progressing toward goal Pt will Ambulate: 16 - 50 feet;with supervision;with rolling walker PT Goal: Ambulate - Progress: Progressing toward goal Pt will Perform Home Exercise Program: with supervision, verbal cues required/provided PT Goal: Perform Home Exercise Program - Progress: Progressing toward goal  Visit Information  Last PT Received On: 07/27/12 Assistance Needed: +2    Subjective Data  Subjective: You know my cousin, don't you?   Cognition  Cognition Arousal/Alertness: Awake/alert Behavior During Therapy: WFL for tasks assessed/performed Overall Cognitive Status: History of cognitive impairments - at baseline    Balance     End of Session PT - End of Session Equipment Utilized During Treatment: Gait belt Activity Tolerance: Patient tolerated treatment well;Patient limited by fatigue Patient left: in chair;with call bell/phone within reach;with chair alarm set Nurse Communication: Mobility status   GP  Ferman Hamming 07/27/2012, 10:59 AM Weldon Picking PT Acute Rehab Services [phone] [place]  [phone]

## 2012-07-27 NOTE — Progress Notes (Signed)
Clinical social worker assisted with patient discharge to skilled nursing facility, [facility].  CSW addressed all family questions and concerns. CSW copied chart and added all important documents. CSW also set up patient transportation with [facility]. Clinical Social Worker will sign off for now as social work intervention is no longer needed.   Wilferd Ritson, MSW, [phone] 

## 2012-07-27 NOTE — Progress Notes (Signed)
Subjective: POD3 s/p R hip arthroplasty. No complaints this am. Denies postoperative pain. Some diarrhea yesterday and this am. No fever, leukocytosis or abd pain. C diff negative.  Denies CP, SOB, N/V, dizziness, abdominal pain.  Objective: Vital signs in last 24 hours: Filed Vitals:   07/26/12 1600 07/26/12 2228 07/27/12 0543 07/27/12 0700  BP:  128/50 136/70   Pulse:  71 76   Temp:  98.7 F (37.1 C)    TempSrc:      Resp: 18     Height:      Weight:    113 lb 6.4 oz (51.438 kg)  SpO2:  100% 100%    Weight change:  No intake or output data in the 24 hours ending 07/27/12 0828 Vitals reviewed.  General: Lying in bed, says she is ready to go to rehab HEENT: PERRL, EOMI, no scleral icterus  Cardiac: RRR, no rubs, murmurs or gallops  Pulm: CTAB Abd: Surgical scars noted. Soft, nontender, nondistended, BS present  Ext: R hip incision site bandaged, no blood on bandage, no hematoma.. Can wiggle R toes. Pulses are intact distally in bilateral lower extremities. Neuro: Patient is tangential and easily distractible   Lab Results: Basic Metabolic Panel:  Recent Labs Lab 07/25/12 0500 07/26/12 0610  NA 140 145  K 3.5 3.4*  CL 106 113*  CO2 27 23  GLUCOSE 124* 104*  BUN 22 21  CREATININE 0.99 1.01  CALCIUM 9.1 9.1   CBC:  Recent Labs Lab 07/23/12 1035  07/26/12 0610 07/27/12 0615  WBC 7.7  < > 5.3 4.9  NEUTROABS 6.4  --   --   --   HGB 12.9  < > 8.0* 7.9*  HCT 36.5  < > 23.4* 23.9*  MCV 88.4  < > 90.7 92.3  PLT 215  < > 150 172  < > = values in this interval not displayed. Cardiac Enzymes:  Recent Labs Lab 07/23/12 1035 07/23/12 1511 07/23/12 2016 07/24/12 0143 07/24/12 0500 07/24/12 0728  CKTOTAL 1858* 1660*  --   --  1004*  --   TROPONINI  --   --  <0.30 <0.30  --  <0.30   Thyroid Function Tests:  Recent Labs Lab 07/23/12 2016  TSH 1.833   Urine Drug Screen: Drugs of Abuse     Component Value Date/Time   LABOPIA NONE DETECTED 07/23/2012 1317    COCAINSCRNUR NONE DETECTED 07/23/2012 1317   COCAINSCRNUR NEG 03/04/2008 1950   LABBENZ NONE DETECTED 07/23/2012 1317   LABBENZ NEG 03/04/2008 1950   AMPHETMU NONE DETECTED 07/23/2012 1317   AMPHETMU NEG 03/04/2008 1950   THCU NONE DETECTED 07/23/2012 1317   LABBARB NONE DETECTED 07/23/2012 1317    Alcohol Level:  Recent Labs Lab 07/23/12 2016  ETH <11   Urinalysis:  Recent Labs Lab 07/23/12 1317  COLORURINE YELLOW  LABSPEC 1.029  PHURINE 5.0  GLUCOSEU NEGATIVE  HGBUR NEGATIVE  BILIRUBINUR SMALL*  KETONESUR NEGATIVE  PROTEINUR >300*  UROBILINOGEN 1.0  NITRITE NEGATIVE  LEUKOCYTESUR MODERATE*   Studies/Results: No results found. Medications: I have reviewed the patient's current medications. Scheduled Meds: . amLODipine  10 mg Oral Daily  . cloNIDine  0.2 mg Oral TID  . enoxaparin (LOVENOX) injection  30 mg Subcutaneous Q24H  . lisinopril  20 mg Oral Daily  . mupirocin ointment  1 application Nasal BID  . potassium chloride SA  40 mEq Oral Once   Continuous Infusions: . lactated ringers 50 mL/hr at 07/24/12 1439   PRN  Meds:.acetaminophen, acetaminophen, HYDROcodone-acetaminophen, loperamide, menthol-cetylpyridinium, metoCLOPramide (REGLAN) injection, metoCLOPramide, morphine injection, ondansetron (ZOFRAN) IV, ondansetron, phenol  Assessment/Plan: Assessment & Plan by Problem:  67 yo female presents w R subcapital hip fx after being found down.  # Fall with R hip fracture, s/p R hip arthroplasty Likely due to progressive deconditioning. No new acute infarct on MRI brain. Plain film R hip showed acute subcapital fracture, now POD3 s/p R hip arthroplasty. - SNF pending - F/u ortho in 2 weeks   #Anemia  Likely postoperative anemia. Hb 10.3-->9.2-->8.0-->7.9  this am, stable. Patient denies symptoms.    #Diarrhea No leukocytosis, fever, or abd pain. C diff negative. - loperamide, replace K  # UTI Had 21-50 WBC/hpf in urine on arrival, but final culture w only  9,000 colonies, no organism identified. - Stopped ceftriaxone 07/25/12  # HTN, uncontrolled  Patient appears to have isolated systolic hypertension with a blood pressure 216/84 in the ED, which decreased to 173/61 after she was given her home medications. This am BP is 136/70 She is supposed to be taking lisinopril, clonidine, amlodipine.  -Continue home antihypertensives   # Hypercalcemia, possible hyperparathyroidism  Noted to be hypercalcemic in the past with elevated PTH. Further workup with consideration of MEN syndrome with PET-CT recommeded as outpatient, but not yet completed.  Calcium nl during admission after IVF - Continue to follow calcium. Returned wnl this am. Will likely still need PET-CT when acute issues resolve.   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 Myna Hidalgo. On sandostatin monthly. According to the most recent notes, her chromogranin A. level has been increasing, which is concerning. According to her oncologist, repeat CT scan may be needed.   # HLD  Hold statin given mild CK elevation on admission. To restart on d/c  Dispo: When SNF bed available. FL2 signed  The patient does have a current PCP (KALIA-REYNOLDS, SHELLY, DO), therefore will be requiring OPC follow-up after discharge.    .Services Needed at time of discharge: Y = Yes, Blank = No PT:   OT:   RN:   Equipment:   Other:     LOS: 4 days   Bronson Curb 07/27/2012, 8:28 AM

## 2012-07-27 NOTE — Progress Notes (Signed)
Orthopedics Progress Note  Subjective: I am ready to head to rehab today  Objective:  Filed Vitals:   07/27/12 0800  BP:   Pulse:   Temp:   Resp: 18    General: Awake and alert  Musculoskeletal: right hip wound CDI, dressing replaced Neurovascularly intact  Lab Results  Component Value Date   WBC 4.9 07/27/2012   HGB 7.9* 07/27/2012   HCT 23.9* 07/27/2012   MCV 92.3 07/27/2012   PLT 172 07/27/2012       Component Value Date/Time   NA 145 07/26/2012 0610   NA 138 07/23/2009 1000   K 3.4* 07/26/2012 0610   K 3.3 07/23/2009 1000   CL 113* 07/26/2012 0610   CL 96* 07/23/2009 1000   CO2 23 07/26/2012 0610   CO2 30 07/23/2009 1000   GLUCOSE 104* 07/26/2012 0610   GLUCOSE 106 07/23/2009 1000   BUN 21 07/26/2012 0610   BUN 16 07/23/2009 1000   CREATININE 1.01 07/26/2012 0610   CREATININE 1.06 12/07/2011 1123   CALCIUM 9.1 07/26/2012 0610   CALCIUM 10.1 07/23/2012 2016   CALCIUM 10.4* 07/23/2009 1000   GFRNONAA 56* 07/26/2012 0610   GFRAA 65* 07/26/2012 0610    Lab Results  Component Value Date   INR 1.13 07/26/2012   INR 0.98 07/24/2012   INR 1.1 05/28/2008    Assessment/Plan: POD #3 s/p Procedure(s): ARTHROPLASTY BIPOLAR HIP Stable orthopedically Acute blood loss anemia - assymptomatic Ok to transfer to rehab today  Viviann Spare R. Ranell Patrick, MD 07/27/2012 12:27 PM

## 2012-08-06 ENCOUNTER — Ambulatory Visit (HOSPITAL_BASED_OUTPATIENT_CLINIC_OR_DEPARTMENT_OTHER): Payer: Medicare HMO | Admitting: Medical

## 2012-08-06 ENCOUNTER — Ambulatory Visit (HOSPITAL_BASED_OUTPATIENT_CLINIC_OR_DEPARTMENT_OTHER): Payer: Medicare HMO

## 2012-08-06 ENCOUNTER — Other Ambulatory Visit (HOSPITAL_BASED_OUTPATIENT_CLINIC_OR_DEPARTMENT_OTHER): Payer: Medicare HMO | Admitting: Lab

## 2012-08-06 VITALS — BP 113/52 | HR 78 | Temp 98.1°F | Resp 16 | Ht 62.0 in | Wt 110.0 lb

## 2012-08-06 DIAGNOSIS — C7A098 Malignant carcinoid tumors of other sites: Secondary | ICD-10-CM

## 2012-08-06 DIAGNOSIS — C787 Secondary malignant neoplasm of liver and intrahepatic bile duct: Secondary | ICD-10-CM

## 2012-08-06 DIAGNOSIS — D499 Neoplasm of unspecified behavior of unspecified site: Secondary | ICD-10-CM

## 2012-08-06 LAB — CBC WITH DIFFERENTIAL (CANCER CENTER ONLY)
BASO#: 0 10*3/uL (ref 0.0–0.2)
BASO%: 0.6 % (ref 0.0–2.0)
EOS%: 1.4 % (ref 0.0–7.0)
HCT: 28.9 % — ABNORMAL LOW (ref 34.8–46.6)
HGB: 9 g/dL — ABNORMAL LOW (ref 11.6–15.9)
LYMPH#: 0.9 10*3/uL (ref 0.9–3.3)
LYMPH%: 13.8 % — ABNORMAL LOW (ref 14.0–48.0)
MCH: 30.9 pg (ref 26.0–34.0)
MCHC: 31.1 g/dL — ABNORMAL LOW (ref 32.0–36.0)
MCV: 99 fL (ref 81–101)
MONO%: 7.5 % (ref 0.0–13.0)
NEUT%: 76.7 % (ref 39.6–80.0)
RDW: 13 % (ref 11.1–15.7)

## 2012-08-06 MED ORDER — OCTREOTIDE ACETATE 30 MG IM KIT
30.0000 mg | PACK | Freq: Once | INTRAMUSCULAR | Status: AC
  Administered 2012-08-06: 30 mg via INTRAMUSCULAR

## 2012-08-06 NOTE — Progress Notes (Signed)
Diagnosis: Metastatic carcinoid, liver metastases.  Current therapy: Sandostatin 30 mg IM q. monthly.  Interim history: [name] comes in today for an office followup visit.  Unfortunately, [name] fell and recently had a right hip hemiarthroplasty.  This was done on 07/24/2012.  [name] is currently at [facility] skilled nursing facility.  [name] is receiving physical therapy.  We are watching her chromogranin A. closely.  A month ago it was 19.0.  It has seemed to fluctuate in the past.  At one point it was 88.0.  [name]'s not reporting any diarrhea or flushing symptoms.  [name]'s not reporting any abdominal pain.  [name] is eating much better.  [name]'s actually gained some weight.  [name] denies any nausea, vomiting, cough, chest pain, shortness of breath.  [name] denies any fevers, chills, or night sweats.  [name] denies any headaches, visual changes, or rashes.  [name] does continue to have some leg swelling of the right lower extremity.  We continue to follow her chromogranin A. level.  [name] remains asymptomatic.  Review of Systems: Constitutional:Negative for malaise/fatigue, fever, chills, weight loss, diaphoresis, activity change, appetite change, and unexpected weight change.  HEENT: Negative for double vision, blurred vision, visual loss, ear pain, tinnitus, congestion, rhinorrhea, epistaxis sore throat or sinus disease, oral pain/lesion, tongue soreness Respiratory: Negative for cough, chest tightness, shortness of breath, wheezing and stridor.  Cardiovascular: Negative for chest pain, palpitations, leg swelling, orthopnea, PND, DOE or claudication Gastrointestinal: Negative for nausea, vomiting, abdominal pain, diarrhea, constipation, blood in stool, melena, hematochezia, abdominal distention, anal bleeding, rectal pain, anorexia and hematemesis.  Genitourinary: Negative for dysuria, frequency, hematuria,  Musculoskeletal: Negative for myalgias, back pain, joint swelling, arthralgias and gait problem.  Skin:  Negative for rash, color change, pallor and wound.  Neurological:. Negative for dizziness/light-headedness, tremors, seizures, syncope, facial asymmetry, speech difficulty, weakness, numbness, headaches and paresthesias.  Hematological: Negative for adenopathy. Does not bruise/bleed easily.  Psychiatric/Behavioral:  Negative for depression, no loss of interest in normal activity or change in sleep pattern.   Physical Exam: This is a petite, 67 year old, black female, in no obvious distress Vitals: Temperature 98.1 degrees pulse 78 respirations 16 blood pressure 113/52 weight 110 pounds  HEENT reveals a normocephalic, atraumatic skull, no scleral icterus, no oral lesions  Neck is supple without any cervical or supraclavicular adenopathy.  Lungs are clear to auscultation bilaterally. There are no wheezes, rales or rhonci Cardiac is regular rate and rhythm with a normal S1 and S2. There are no murmurs, rubs, or bruits.  Abdomen is soft with good bowel sounds, there is no palpable mass. There is no palpable hepatosplenomegaly. There is no palpable fluid wave.  Musculoskeletal no tenderness of the spine, ribs, or hips.  Extremities there are no clubbing, cyanosis, trace edema of right leg Skin no petechia, purpura or ecchymosis Neurologic is nonfocal.  Laboratory Data:  White count 3.7 hemoglobin 8.5 hematocrit 25.9 platelets 307,000  Current Outpatient Prescriptions on File Prior to Visit  Medication Sig Dispense Refill  . amLODipine (NORVASC) 10 MG tablet Take 1 tablet (10 mg total) by mouth daily.  30 tablet  5  . cloNIDine (CATAPRES) 0.2 MG tablet Take 1 tablet (0.2 mg total) by mouth 3 (three) times daily.  90 tablet  5  . lisinopril (PRINIVIL,ZESTRIL) 20 MG tablet TAKE ONE TABLET BY MOUTH ONE TIME DAILY  30 tablet  0  . Multiple Vitamins-Iron (MULTIVITAMIN/IRON) TABS Take 1 tablet by mouth daily.       . simvastatin (ZOCOR) 20 MG tablet  Take 1 tablet (20 mg total) by mouth at bedtime.  30  tablet  5   Assessment/Plan:  This is a 67 year old, African American female, with the following issues:  #1.  Metastatic carcinoid.  Overall, [name] seems to be doing quite well.  [name] does not show any progressive signs for symptomatic disease.  Her last CT scans were back in may of 2011.  [name] has not had scans since then, secondary to her being asymptomatic.  Her chromogranin A. level does tend to fluctuate.  Most recently was 19.  We will continue to monitor this closely.  #2.  Followup.  [name] will follow back up with [place] in 2 months, but before then should there be questions or concerns.

## 2012-08-07 ENCOUNTER — Other Ambulatory Visit: Payer: Medicare HMO | Admitting: Lab

## 2012-08-07 ENCOUNTER — Ambulatory Visit: Payer: Medicare HMO | Admitting: Hematology & Oncology

## 2012-08-07 ENCOUNTER — Ambulatory Visit: Payer: Medicare HMO

## 2012-08-10 LAB — COMPREHENSIVE METABOLIC PANEL
AST: 15 U/L (ref 0–37)
Alkaline Phosphatase: 145 U/L — ABNORMAL HIGH (ref 39–117)
BUN: 21 mg/dL (ref 6–23)
Creatinine, Ser: 0.97 mg/dL (ref 0.50–1.10)
Glucose, Bld: 152 mg/dL — ABNORMAL HIGH (ref 70–99)

## 2012-08-29 ENCOUNTER — Non-Acute Institutional Stay (SKILLED_NURSING_FACILITY): Payer: Medicare HMO | Admitting: Internal Medicine

## 2012-08-29 DIAGNOSIS — S72009S Fracture of unspecified part of neck of unspecified femur, sequela: Secondary | ICD-10-CM

## 2012-08-29 DIAGNOSIS — I150 Renovascular hypertension: Secondary | ICD-10-CM

## 2012-08-29 DIAGNOSIS — D631 Anemia in chronic kidney disease: Secondary | ICD-10-CM

## 2012-08-29 DIAGNOSIS — S72001S Fracture of unspecified part of neck of right femur, sequela: Secondary | ICD-10-CM

## 2012-08-29 DIAGNOSIS — N039 Chronic nephritic syndrome with unspecified morphologic changes: Secondary | ICD-10-CM

## 2012-09-03 ENCOUNTER — Other Ambulatory Visit (HOSPITAL_BASED_OUTPATIENT_CLINIC_OR_DEPARTMENT_OTHER): Payer: Medicare HMO | Admitting: Lab

## 2012-09-03 ENCOUNTER — Ambulatory Visit (HOSPITAL_BASED_OUTPATIENT_CLINIC_OR_DEPARTMENT_OTHER): Payer: Medicare HMO

## 2012-09-03 VITALS — BP 145/69 | HR 72 | Temp 98.1°F | Resp 20

## 2012-09-03 DIAGNOSIS — D499 Neoplasm of unspecified behavior of unspecified site: Secondary | ICD-10-CM

## 2012-09-03 DIAGNOSIS — C7A098 Malignant carcinoid tumors of other sites: Secondary | ICD-10-CM

## 2012-09-03 DIAGNOSIS — C787 Secondary malignant neoplasm of liver and intrahepatic bile duct: Secondary | ICD-10-CM

## 2012-09-03 LAB — CBC WITH DIFFERENTIAL (CANCER CENTER ONLY)
BASO#: 0.1 10*3/uL (ref 0.0–0.2)
Eosinophils Absolute: 0.1 10*3/uL (ref 0.0–0.5)
HGB: 10.9 g/dL — ABNORMAL LOW (ref 11.6–15.9)
LYMPH%: 21.4 % (ref 14.0–48.0)
MCH: 30.6 pg (ref 26.0–34.0)
MCV: 97 fL (ref 81–101)
MONO#: 0.4 10*3/uL (ref 0.1–0.9)
MONO%: 5.8 % (ref 0.0–13.0)
NEUT#: 4.2 10*3/uL (ref 1.5–6.5)
Platelets: 254 10*3/uL (ref 145–400)
RBC: 3.56 10*6/uL — ABNORMAL LOW (ref 3.70–5.32)
WBC: 6 10*3/uL (ref 3.9–10.0)

## 2012-09-03 MED ORDER — OCTREOTIDE ACETATE 30 MG IM KIT
30.0000 mg | PACK | Freq: Once | INTRAMUSCULAR | Status: AC
  Administered 2012-09-03: 30 mg via INTRAMUSCULAR

## 2012-09-03 NOTE — Patient Instructions (Signed)

## 2012-09-05 ENCOUNTER — Non-Acute Institutional Stay (SKILLED_NURSING_FACILITY): Payer: Medicare HMO | Admitting: Internal Medicine

## 2012-09-07 LAB — COMPREHENSIVE METABOLIC PANEL
ALT: 13 U/L (ref 0–35)
CO2: 24 mEq/L (ref 19–32)
Calcium: 10.6 mg/dL — ABNORMAL HIGH (ref 8.4–10.5)
Chloride: 108 mEq/L (ref 96–112)
Glucose, Bld: 165 mg/dL — ABNORMAL HIGH (ref 70–99)
Sodium: 144 mEq/L (ref 135–145)
Total Bilirubin: 0.4 mg/dL (ref 0.3–1.2)
Total Protein: 7.1 g/dL (ref 6.0–8.3)

## 2012-09-07 LAB — LACTATE DEHYDROGENASE: LDH: 140 U/L (ref 94–250)

## 2012-09-10 ENCOUNTER — Non-Acute Institutional Stay (SKILLED_NURSING_FACILITY): Payer: Medicare HMO | Admitting: Internal Medicine

## 2012-09-10 DIAGNOSIS — E210 Primary hyperparathyroidism: Secondary | ICD-10-CM

## 2012-09-15 NOTE — Assessment & Plan Note (Signed)
She has been taking her blood pressure medication as prescribed and denies headaches, or changes in her vision.  She also denies dizziness on standing.    BP Readings from Last 3 Encounters:  12/07/11 137/67  12/01/11 131/77  11/07/11 138/65   Blood pressure today is similar to previous.  Creatinine is unchanged from previously so we will continue her current medications at their current doses.

## 2012-09-15 NOTE — Assessment & Plan Note (Signed)
SHe has a history of iron deficiency anemia but has refused workup with a colonoscopy before.  We will start with the FOBT cards and if they are positive we will revisit the discussion about colonoscopy.  If they are negative that is a least reassuring.    Her three FOBT cards were negative so that is reassuring that the location of her blood loss is not from her GI tract.

## 2012-09-15 NOTE — Assessment & Plan Note (Signed)
She has asymptomatic hypercalcemia with no recent stones, change in mood, or bone pain.  We will check her vitamin D level today along with repeating her calcium and albumin levels.  We will also chest her iPTH.  Calcium is mildly decreased from previous down from 11.5 to 10.6 and her albumin is 4.8.  Vitamin D2 is mildly low at 26 and her iPTH was 126 which is mildly increased.  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  We will continue her off of her HCTZ and encourage her fluid intake as well as to continue following with her Oncologist for treatment options.

## 2012-09-15 NOTE — Assessment & Plan Note (Signed)
Her weight loss is likely multifactorial including her metastatic carcinoid cancer as well as decreased oral intake. I encouraged her to get Ensure supplement and drink one twice daily between meals to help supplement her protein intake.

## 2012-09-18 DIAGNOSIS — D631 Anemia in chronic kidney disease: Secondary | ICD-10-CM | POA: Insufficient documentation

## 2012-09-18 DIAGNOSIS — I150 Renovascular hypertension: Secondary | ICD-10-CM | POA: Insufficient documentation

## 2012-09-18 NOTE — Progress Notes (Signed)
Patient ID: [name], female   DOB: 06-14-1945, 67 y.o.   MRN: [id number]        HISTORY & PHYSICAL  DATE: 08/29/2012   FACILITY: [facility]  LEVEL OF CARE: SNF (31)  ALLERGIES:   Sulfa.   CHIEF COMPLAINT:  Manage renovascular hypertension, chronic kidney disease stage III, and anemia of chronic kidney disease.    HISTORY OF PRESENT ILLNESS:  The patient is a 67 year-old, African-American female who was transferred from [facility] nursing home to this facility.  She has the following problems:    HTN: Pt 's HTN remains stable.  Staff denies CP, sob, DOE, pedal edema, headaches, dizziness or visual disturbances.  No complications from the medications currently being used.  Last BP :  140/70.  Patient is a poor historian.    CHRONIC KIDNEY DISEASE: The patient's chronic kidney disease remains stable.  Staff denies increasing lower extremity swelling or confusion. Last BUN and creatinine are:     ANEMIA: The anemia has been stable. The staff denies fatigue, melena or hematochezia. She is  currently not on any medications for her anemia.  Anemia is secondary to chronic kidney disease.    PAST MEDICAL HISTORY :  Past Medical History  Diagnosis Date  . 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 Myna Hidalgo w/sandostatin injections q monthly  . Anemia     iron deficiency  . Depression   . Hyperlipidemia   . Hypertension   . History of cocaine abuse     quit 03/06; seizure and HTN urgency secondary to use 12/04  . Seizures   . 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 Weight loss     15 lbs 08/07 (regained 104 08/07 and 112 lbs 12/07)  . Arthritis   . Halitosis     per notes  . Herpes zoster  of eye 04/09    right eye  . Chronic kidney disease, stage III (moderate)     BL Cr 1.2-1.3   History of right hip fracture.  Anemia of chronic kidney disease.  Renovascular hypertension.   History of seizure disorder.  PAST SURGICAL HISTORY: Past Surgical History  Procedure Laterality Date  . Hip arthroplasty Right 07/24/2012    Procedure: ARTHROPLASTY BIPOLAR HIP;  Surgeon: Verlee Rossetti, MD;  Location: [facility] OR;  Service: Orthopedics;  Laterality: Right;    SOCIAL HISTORY:  reports that she has never smoked. She does not have any smokeless tobacco history on file. She reports that she does not drink alcohol or use illicit drugs. TOBACCO USE:  She has a history of smoking.    HOUSING:  She was living alone prior to coming into [facility].  FAMILY HISTORY:  Family History  Problem Relation Age of Onset  . Cancer Mother   . Cancer Father   . Diabetes Brother     CURRENT MEDICATIONS: Reviewed per [facility]  REVIEW OF SYSTEMS:  Unobtainable.  Patient is a poor historian.   PHYSICAL EXAMINATION  VS:  T 99.4     P 74      RR 18      BP 140/70      POX%  WT (Lb) 105  GENERAL: no acute distress, normal body habitus SKIN: warm & dry, no suspicious lesions or rashes, no excessive dryness EYES: conjunctivae normal, sclerae normal, normal eye lids MOUTH/THROAT: lips without lesions,no lesions in the mouth,tongue is without lesions,uvula elevates in midline NECK: supple, trachea midline, no neck masses, no thyroid tenderness, no thyromegaly LYMPHATICS: no LAN in the neck, no supraclavicular LAN RESPIRATORY: breathing is even & unlabored, BS CTAB CARDIAC: RRR, no murmur,no extra heart sounds EDEMA/VARICOSITIES: right lower extremity has +2 edema, left lower extremity has +1 edema. ARTERIAL: pedal pulses nonpalpable  GI:  ABDOMEN: abdomen soft, normal BS, no masses, no tenderness  LIVER/SPLEEN: no hepatomegaly, no splenomegaly MUSCULOSKELETAL: HEAD: normal to  inspection & palpation BACK: no kyphosis, scoliosis or spinal processes tenderness EXTREMITIES: LEFT UPPER EXTREMITY: full range of motion, normal strength & tone RIGHT UPPER EXTREMITY:  full range of motion, normal strength & tone LEFT LOWER EXTREMITY: strength intact, range of motion minimal  RIGHT LOWER EXTREMITY: strength intact, range of motion minimal  PSYCHIATRIC: the patient is alert & disoriented, affect & behavior appropriate  LABS/RADIOLOGY: None received at admission.  ASSESSMENT/PLAN:  Renovascular hypertension.  Blood pressure borderline.  We will monitor.   Chronic kidney disease stage III.  Reassess renal functions.   Anemia of chronic kidney disease.  Check hemoglobin level.   Recent right hip fracture.  Status post repair.   History of CVA.   Currently not on any anticoagulants.   Metastatic carcinoid tumor.  Status post small bowel resection.    Seizure disorder.   Currently not on any anti-seizure medications.  No seizure activity since admission.  Check CBC and CMP.   I have reviewed patient's medical records received at admission/from hospitalization.  CPT CODE: 95621

## 2012-09-26 NOTE — Progress Notes (Signed)
Patient ID: [name], female   DOB: [birth year], 67 y.o.   MRN: [id number]        PROGRESS NOTE  DATE: 09/05/2012  FACILITY:  [facility]  LEVEL OF CARE: SNF (31)  Acute Visit  CHIEF COMPLAINT:  Manage hypercalcemia.    HISTORY OF PRESENT ILLNESS: I was requested by the staff to assess the patient regarding above problem(s):  On 09/04/2012, patient's calcium level was 10.6.  A prior value is not available.  She denies any neuromuscular symptoms.  She is not on calcium supplementation.    PAST MEDICAL HISTORY : Reviewed.  No changes.  CURRENT MEDICATIONS: Reviewed per [facility]  REVIEW OF SYSTEMS:  GENERAL: no change in appetite, no fatigue, no weight changes, no fever, chills or weakness RESPIRATORY: no cough, SOB, DOE,, wheezing, hemoptysis CARDIAC: no chest pain or palpitations, complains of lower extremity swelling GI: no abdominal pain, diarrhea, constipation, heart burn, nausea or vomiting  PHYSICAL EXAMINATION  GENERAL: no acute distress, normal body habitus NECK: supple, trachea midline, no neck masses, no thyroid tenderness, no thyromegaly RESPIRATORY: breathing is even & unlabored, BS CTAB CARDIAC: RRR, no murmur,no extra heart sounds EDEMA/VARICOSITIES: right lower extremity has +2 edema, left lower extremity has +1 edema ARTERIAL: pedal pulses nonpalpable  GI: abdomen soft, normal BS, no masses, no tenderness, no hepatomegaly, no splenomegaly PSYCHIATRIC: the patient is alert & oriented to person, affect & behavior appropriate  ASSESSMENT/PLAN:  Hypercalcemia.  New problem.  Recheck calcium level, ionized calcium, and intact PTH.    CPT CODE: 14782

## 2012-09-28 ENCOUNTER — Ambulatory Visit (HOSPITAL_BASED_OUTPATIENT_CLINIC_OR_DEPARTMENT_OTHER): Payer: Medicare HMO | Admitting: Lab

## 2012-09-28 ENCOUNTER — Ambulatory Visit (HOSPITAL_BASED_OUTPATIENT_CLINIC_OR_DEPARTMENT_OTHER): Payer: Medicare HMO | Admitting: Hematology & Oncology

## 2012-09-28 ENCOUNTER — Ambulatory Visit (HOSPITAL_BASED_OUTPATIENT_CLINIC_OR_DEPARTMENT_OTHER): Payer: Medicare HMO

## 2012-09-28 DIAGNOSIS — D499 Neoplasm of unspecified behavior of unspecified site: Secondary | ICD-10-CM

## 2012-09-28 DIAGNOSIS — C787 Secondary malignant neoplasm of liver and intrahepatic bile duct: Secondary | ICD-10-CM

## 2012-09-28 DIAGNOSIS — E340 Carcinoid syndrome: Secondary | ICD-10-CM

## 2012-09-28 LAB — CBC WITH DIFFERENTIAL (CANCER CENTER ONLY)
BASO%: 0.5 % (ref 0.0–2.0)
EOS%: 0.7 % (ref 0.0–7.0)
HCT: 35.5 % (ref 34.8–46.6)
LYMPH%: 20 % (ref 14.0–48.0)
MCHC: 32.1 g/dL (ref 32.0–36.0)
MCV: 93 fL (ref 81–101)
MONO#: 0.4 10*3/uL (ref 0.1–0.9)
MONO%: 6.8 % (ref 0.0–13.0)
NEUT%: 72 % (ref 39.6–80.0)
Platelets: 196 10*3/uL (ref 145–400)
RDW: 12.4 % (ref 11.1–15.7)
WBC: 5.6 10*3/uL (ref 3.9–10.0)

## 2012-09-28 MED ORDER — OCTREOTIDE ACETATE 30 MG IM KIT
30.0000 mg | PACK | Freq: Once | INTRAMUSCULAR | Status: AC
  Administered 2012-09-28: 30 mg via INTRAMUSCULAR

## 2012-09-28 NOTE — Progress Notes (Signed)
This office note has been dictated.

## 2012-10-01 NOTE — Progress Notes (Signed)
CC:   Karlene Einstein, M.D.  DIAGNOSIS:  Metastatic carcinoid with liver metastasis.  CURRENT THERAPY:  Sandostatin 30 mg IM q. month.  INTERIM HISTORY:  Ms. Atchley comes in for a followup. Unfortunately, she fell and broke her right hip.  This was repaired at [facility].  She had a closed right hip fracture.  She is now at [facility].  She is doing okay there.  She has not had her Sandostatin now for about 2 months or so.  She is having no abdominal problems.  She has had no nausea or vomiting. She has had high blood pressure issues.  Apparently, her blood pressure was over 300 when she was admitted to the hospital.  When we last saw her, her chromogranin A level was 90.  PHYSICAL EXAM:  General:  This is a fairly well developed, well- nourished black female in no obvious distress.  Vital Signs: Temperature of 98.4, pulse 60, respiratory rate 18, blood pressure 132/40.  Weight is 110.  Head and Neck:  Shows a normocephalic, atraumatic skull.  There are no ocular or oral lesions.  She has no scleral icterus.  There is no adenopathy in the neck.  Lungs:  Clear bilaterally.  Cardiac:  Regular rate and rhythm with a normal S1 and S2. There are no murmurs, rubs, or bruits.  Abdomen:  Soft with good bowel sounds.  There is no palpable abdominal mass.  There is no palpable hepatosplenomegaly.  Extremities:  Show no clubbing, cyanosis, or edema. Hip exam does show the healing right hip repair scar.  LABORATORY STUDIES:  White cell count of 5.6, hemoglobin 11.4, hematocrit 35.5, platelet count 196.  IMPRESSION:  Ms. Broman is a 67 year old female with a metastatic carcinoid.  Thankfully, this has not been an issue for her.  We will go and give her the Sandostatin today.  We will plan to get her back monthly.  She should be able to get back here now monthly.  I feel bad that she had this fall and right hip fracture.  I will see her back myself in 3  months.    ______________________________ Josph Macho, M.D. PRE/MEDQ  D:  09/28/2012  T:  09/29/2012  Job:  [id number]

## 2012-10-02 LAB — COMPREHENSIVE METABOLIC PANEL
ALT: 12 U/L (ref 0–35)
AST: 15 U/L (ref 0–37)
Alkaline Phosphatase: 75 U/L (ref 39–117)
BUN: 20 mg/dL (ref 6–23)
Calcium: 10.4 mg/dL (ref 8.4–10.5)
Creatinine, Ser: 1.16 mg/dL — ABNORMAL HIGH (ref 0.50–1.10)
Total Bilirubin: 0.5 mg/dL (ref 0.3–1.2)

## 2012-10-02 LAB — CHROMOGRANIN A: Chromogranin A: 106 ng/mL — ABNORMAL HIGH (ref 1.9–15.0)

## 2012-10-03 ENCOUNTER — Non-Acute Institutional Stay (SKILLED_NURSING_FACILITY): Payer: Medicare HMO | Admitting: Internal Medicine

## 2012-10-03 DIAGNOSIS — E210 Primary hyperparathyroidism: Secondary | ICD-10-CM | POA: Insufficient documentation

## 2012-10-03 DIAGNOSIS — I6990 Unspecified sequelae of unspecified cerebrovascular disease: Secondary | ICD-10-CM | POA: Insufficient documentation

## 2012-10-03 DIAGNOSIS — D499 Neoplasm of unspecified behavior of unspecified site: Secondary | ICD-10-CM

## 2012-10-03 DIAGNOSIS — I150 Renovascular hypertension: Secondary | ICD-10-CM

## 2012-10-03 NOTE — Progress Notes (Signed)
PROGRESS NOTE  DATE: 10/03/2012  FACILITY: Nursing Home Location: [facility]  LEVEL OF CARE: SNF (31)  Routine Visit  CHIEF COMPLAINT:  Manage hypertension, CVA and carcinoid tumor  HISTORY OF PRESENT ILLNESS:  REASSESSMENT OF ONGOING PROBLEM(S):  HTN: Pt 's HTN remains stable.  Denies CP, sob, DOE, headaches, dizziness or visual disturbances.  No complications from the medications currently being used.  Last BP : 142/72.  CVA: The patient's CVA remains stable.  Patient denies new neurologic symptoms such as numbness, tingling, weakness, speech difficulties or visual disturbances.  No complications reported from the medications currently being used. She has a history of hemorrhagic CVA and residual right-sided hemiparesis.  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 She is status post bowel resection. She is currently followed by the oncologist and is on Sandostatin injections q. monthly. She denies ongoing abdominal pain melena, hematochezia, nausea or vomiting.  PAST MEDICAL HISTORY : Reviewed.  No changes.  CURRENT MEDICATIONS: Reviewed per [facility]  REVIEW OF SYSTEMS:  GENERAL: no change in appetite, no fatigue, no weight changes, no fever, chills or weakness RESPIRATORY: no cough, SOB, DOE, wheezing, hemoptysis CARDIAC: no chest pain, or palpitations. Complains of chronic lower extremity swelling right greater than left GI: no abdominal pain, diarrhea, constipation, heart burn, nausea or vomiting  PHYSICAL EXAMINATION  VS:  T 97.9      P 80      RR20      BP 142/72     POX %     WT (Lb) 1:15  GENERAL: no acute distress, normal body habitus EYES: conjunctivae normal, sclerae normal, normal eye lids NECK: supple, trachea midline, no neck masses, no thyroid tenderness, no thyromegaly LYMPHATICS: no LAN in the neck, no supraclavicular LAN RESPIRATORY: breathing is even & unlabored, BS CTAB CARDIAC: RRR, no murmur,no extra heart sounds, right  lower extremity has +2 edema and left lower extremity has +1 edema GI: abdomen soft, normal BS, no masses, no tenderness, no hepatomegaly, no splenomegaly PSYCHIATRIC: the patient is alert & oriented to person, affect & behavior appropriate  LABS/RADIOLOGY:  6/14 calcium 11, ionized calcium 5.7, intact PTH 90, CBC normal, calcium 10.6 otherwise CMP normal  ASSESSMENT/PLAN:  Renovascular hypertension-BP borderline. Will review a log. CVA-continue supportive care. Carcinoid tumor- status post resection. Primary hyperparathyroidism-patient asymptomatic. Will monitor. Bilateral conjunctivitis-currently on gentamicin eyedrops. History of depression-no symptoms off antidepressants. History of seizure disorder-no episodes off antiseizure medications.  CPT CODE: 84696

## 2012-10-03 NOTE — Progress Notes (Signed)
Patient ID: [name], female   DOB: 27-Dec-1945, 67 y.o.   MRN: [id number]        PROGRESS NOTE  DATE: 09/10/2012  FACILITY:  [facility]  LEVEL OF CARE: SNF (31)  Acute Visit  CHIEF COMPLAINT:  Manage primary hyperparathyroidism.    HISTORY OF PRESENT ILLNESS: I was requested by the staff to assess the patient regarding above problem(s):  PRIMARY HYPERPARATHYROIDISM:  On 09/06/2012:  Patient's calcium level was 11, intact PTH 90.  Non-intact serum ionized calcium level is pending.  She is overall a poor historian.  However, staff do not report any changes or new medical signs and symptoms.    PAST MEDICAL HISTORY : Reviewed.  No changes.  CURRENT MEDICATIONS: Reviewed per [facility]  REVIEW OF SYSTEMS:  Unobtainable.  Patient is a poor historian.   PHYSICAL EXAMINATION  GENERAL: no acute distress, normal body habitus NECK: supple, trachea midline, no neck masses, no thyroid tenderness, no thyromegaly RESPIRATORY: breathing is even & unlabored, BS CTAB CARDIAC: RRR, no murmur,no extra heart sounds EDEMA/VARICOSITIES: right lower extremity has +2 edema, left lower extremity has +1 edema GI: abdomen soft, normal BS, no masses, no tenderness, no hepatomegaly, no splenomegaly PSYCHIATRIC: the patient is alert, disoriented, affect & behavior appropriate  ASSESSMENT/PLAN:  Primary hyperparathyroidism.  New problem.  Patient is asymptomatic.  We will monitor calcium level.  CPT CODE: 82956

## 2012-10-08 ENCOUNTER — Non-Acute Institutional Stay (SKILLED_NURSING_FACILITY): Payer: Medicare HMO | Admitting: Adult Health

## 2012-10-08 DIAGNOSIS — S72009D Fracture of unspecified part of neck of unspecified femur, subsequent encounter for closed fracture with routine healing: Secondary | ICD-10-CM

## 2012-10-08 DIAGNOSIS — D499 Neoplasm of unspecified behavior of unspecified site: Secondary | ICD-10-CM

## 2012-10-08 DIAGNOSIS — S72001D Fracture of unspecified part of neck of right femur, subsequent encounter for closed fracture with routine healing: Secondary | ICD-10-CM

## 2012-10-08 DIAGNOSIS — I150 Renovascular hypertension: Secondary | ICD-10-CM

## 2012-10-08 DIAGNOSIS — E210 Primary hyperparathyroidism: Secondary | ICD-10-CM

## 2012-10-26 ENCOUNTER — Other Ambulatory Visit: Payer: Medicare HMO | Admitting: Lab

## 2012-10-26 ENCOUNTER — Ambulatory Visit: Payer: Medicare HMO

## 2012-10-29 ENCOUNTER — Ambulatory Visit (HOSPITAL_BASED_OUTPATIENT_CLINIC_OR_DEPARTMENT_OTHER): Payer: Medicare HMO

## 2012-10-29 ENCOUNTER — Other Ambulatory Visit: Payer: Medicare HMO | Admitting: Lab

## 2012-10-29 VITALS — BP 166/79 | HR 78 | Temp 97.5°F | Resp 20

## 2012-10-29 DIAGNOSIS — E340 Carcinoid syndrome: Secondary | ICD-10-CM

## 2012-10-29 DIAGNOSIS — D499 Neoplasm of unspecified behavior of unspecified site: Secondary | ICD-10-CM

## 2012-10-29 DIAGNOSIS — C787 Secondary malignant neoplasm of liver and intrahepatic bile duct: Secondary | ICD-10-CM

## 2012-10-29 MED ORDER — OCTREOTIDE ACETATE 30 MG IM KIT
30.0000 mg | PACK | Freq: Once | INTRAMUSCULAR | Status: AC
  Administered 2012-10-29: 30 mg via INTRAMUSCULAR

## 2012-10-29 NOTE — Patient Instructions (Signed)

## 2012-11-01 ENCOUNTER — Non-Acute Institutional Stay (SKILLED_NURSING_FACILITY): Payer: Medicare HMO | Admitting: Internal Medicine

## 2012-11-01 DIAGNOSIS — E210 Primary hyperparathyroidism: Secondary | ICD-10-CM

## 2012-11-01 DIAGNOSIS — I6990 Unspecified sequelae of unspecified cerebrovascular disease: Secondary | ICD-10-CM

## 2012-11-01 DIAGNOSIS — D499 Neoplasm of unspecified behavior of unspecified site: Secondary | ICD-10-CM

## 2012-11-01 DIAGNOSIS — I150 Renovascular hypertension: Secondary | ICD-10-CM

## 2012-11-03 NOTE — Progress Notes (Signed)
PROGRESS NOTE  DATE: 11-01-12  FACILITY: Nursing Home Location: [facility]  LEVEL OF CARE: SNF (31)  Routine Visit  CHIEF COMPLAINT:  Manage hypertension, CVA and carcinoid tumor  HISTORY OF PRESENT ILLNESS:  REASSESSMENT OF ONGOING PROBLEM(S):  HTN: Pt 's HTN remains stable.  Denies CP, sob, DOE, headaches, dizziness or visual disturbances.  No complications from the medications currently being used.  Last BP : 142/72, 120/62.  CVA: The patient's CVA remains stable.  Patient denies new neurologic symptoms such as numbness, tingling, weakness, speech difficulties or visual disturbances.  No complications reported from the medications currently being used. She has a history of hemorrhagic CVA and residual right-sided hemiparesis.  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 She is status post bowel resection. She is currently followed by the oncologist and is on Sandostatin injections q. monthly. She denies ongoing abdominal pain melena, hematochezia, nausea or vomiting.  PAST MEDICAL HISTORY : Reviewed.  No changes.  CURRENT MEDICATIONS: Reviewed per [facility]  REVIEW OF SYSTEMS: Difficult to obtain, patient is a poor historian  PHYSICAL EXAMINATION  VS:  T 98.5      P 64      RR20      BP 120/62     POX %     WT (Lb) 115  GENERAL: no acute distress, normal body habitus NECK: supple, trachea midline, no neck masses, no thyroid tenderness, no thyromegaly RESPIRATORY: breathing is even & unlabored, BS CTAB CARDIAC: RRR, no murmur,no extra heart sounds, right lower extremity has +2 edema and left lower extremity has +1 edema GI: abdomen soft, normal BS, no masses, no tenderness, no hepatomegaly, no splenomegaly PSYCHIATRIC: the patient is alert & oriented to person, affect & behavior appropriate  LABS/RADIOLOGY:  6/14 calcium 11, ionized calcium 5.7, intact PTH 90, CBC normal, calcium 10.6 otherwise CMP normal  ASSESSMENT/PLAN:  Renovascular  hypertension-well controlled CVA-continue supportive care. Carcinoid tumor- status post resection. Primary hyperparathyroidism-patient asymptomatic. Will monitor. History of seizure disorder-no episodes off antiseizure medications.  CPT CODE: 16109

## 2012-11-27 ENCOUNTER — Encounter: Payer: Self-pay | Admitting: Family

## 2012-11-27 ENCOUNTER — Non-Acute Institutional Stay (SKILLED_NURSING_FACILITY): Payer: Medicare HMO | Admitting: Family

## 2012-11-27 DIAGNOSIS — E210 Primary hyperparathyroidism: Secondary | ICD-10-CM

## 2012-11-27 DIAGNOSIS — D631 Anemia in chronic kidney disease: Secondary | ICD-10-CM

## 2012-11-27 DIAGNOSIS — D499 Neoplasm of unspecified behavior of unspecified site: Secondary | ICD-10-CM

## 2012-11-27 DIAGNOSIS — S72009S Fracture of unspecified part of neck of unspecified femur, sequela: Secondary | ICD-10-CM

## 2012-11-27 DIAGNOSIS — S72001S Fracture of unspecified part of neck of right femur, sequela: Secondary | ICD-10-CM

## 2012-11-27 DIAGNOSIS — I150 Renovascular hypertension: Secondary | ICD-10-CM

## 2012-11-27 DIAGNOSIS — I6990 Unspecified sequelae of unspecified cerebrovascular disease: Secondary | ICD-10-CM

## 2012-11-27 NOTE — Progress Notes (Signed)
Patient ID: [name], female   DOB: 05-May-1945, 67 y.o.   MRN: [id number]  DATE: 11/27/12  FACILITY: Nursing Home Location: [facility]  LEVEL OF CARE: SNF (31)  Routine Visit  CHIEF COMPLAINT:  Manage hypertension, CVA and carcinoid tumor  HISTORY OF PRESENT ILLNESS:  REASSESSMENT OF ONGOING PROBLEM(S):  HTN: Pt 's HTN remains stable.  Denies CP, SOB, palpitations,  DOE, headaches, dizziness or visual disturbances.  No complications from the medications currently being used.  .  CVA: The patient's CVA remains stable.  Patient denies new onset of dysarthia, neuropathy, syncope, or dizziness.  No complications reported from the medications currently being used. She has a history of hemorrhagic CVA and residual right-sided hemiparesis.  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 She is status post bowel resection. She is currently stable.  She denies abdominal pain, melena, hematochezia, anorexia, constipation, diarrhea,nausea or vomiting.  Past Medical History  Diagnosis Date  . 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 Myna Hidalgo w/sandostatin injections q monthly  . Anemia     iron deficiency  . Depression   . Hyperlipidemia   . Hypertension   . History of cocaine abuse     quit 03/06; seizure and HTN urgency secondary to use 12/04  . Seizures   . 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 Weight loss     15 lbs 08/07 (regained 104 08/07 and 112 lbs 12/07)  . Arthritis   . Halitosis     per notes  . Herpes zoster of eye 04/09    right eye  . Chronic kidney disease, stage III (moderate)     BL Cr 1.2-1.3       Medication List       This list is accurate as of:  11/27/12  2:52 PM.  Always use your most recent med list.               acetaminophen 325 MG tablet  Commonly known as:  TYLENOL  Take 2 tablets (650 mg total) by mouth every 6 (six) hours as needed.     amLODipine 10 MG tablet  Commonly known as:  NORVASC  Take 1 tablet (10 mg total) by mouth daily.     cloNIDine 0.2 MG tablet  Commonly known as:  CATAPRES  Take 1 tablet (0.2 mg total) by mouth 3 (three) times daily.     lisinopril 20 MG tablet  Commonly known as:  PRINIVIL,ZESTRIL  Take 1 tablet (20 mg total) by mouth daily.     loperamide 2 MG capsule  Commonly known as:  IMODIUM  Take 1 capsule (2 mg total) by mouth as needed for diarrhea or loose stools.     Multivitamin/Iron Tabs  Take 1 tablet by mouth daily.        Review of Systems  Constitutional: Negative for fever, chills, weight loss and malaise/fatigue.  HENT: Negative.   Cardiovascular: Positive for leg swelling.  Gastrointestinal: Negative.   Genitourinary: Negative.   Musculoskeletal: Negative.   Skin: Negative.   Neurological:       H/o CVA with residual R sided hemiparesis  Endo/Heme/Allergies: Negative.   Psychiatric/Behavioral: Negative.      PHYSICAL EXAMINATION  Filed Vitals:   11/27/12 1438  BP: 154/72  Pulse: 68  Temp: 97.2 F (36.2 C)  Resp: 18     Physical Exam  Constitutional: She is oriented to person, place, and time. She appears well-developed.  HENT:  Head: Normocephalic.  Mouth/Throat: Oropharynx is clear and moist.  Poor dentition  Eyes: Pupils are equal, round, and reactive to light.  Neck: No tracheal deviation present. No thyromegaly present.  Cardiovascular: Normal rate, regular rhythm and normal heart sounds.   Pulmonary/Chest: Effort normal and breath sounds normal.  Abdominal: Soft. Bowel sounds are normal.  Musculoskeletal:  R sided hemiparesis; DME wheelchair; Adherence with PT/OT exercise regimen  Lymphadenopathy:    She has no cervical adenopathy.   Neurological: She is alert and oriented to person, place, and time.  Skin: Skin is warm and dry.  Psychiatric: She has a normal mood and affect. Her behavior is normal.     LABS/RADIOLOGY:  6/14 calcium 11, ionized calcium 5.7, intact PTH 90, CBC normal, calcium 10.6 otherwise CMP normal  ASSESSMENT/PLAN:  Renovascular hypertension-stable CVA-continue supportive care. Carcinoid tumor- status post resection. Primary hyperparathyroidism-patient asymptomatic. Willlcontinue to  monitor. History of seizure disorder-no episodes off antiseizure medications.  50 minutes spent with patient

## 2012-11-29 ENCOUNTER — Encounter (HOSPITAL_COMMUNITY): Payer: Self-pay | Admitting: Emergency Medicine

## 2012-11-29 ENCOUNTER — Emergency Department (HOSPITAL_COMMUNITY): Payer: Medicare HMO

## 2012-11-29 ENCOUNTER — Emergency Department (HOSPITAL_COMMUNITY)
Admission: EM | Admit: 2012-11-29 | Discharge: 2012-11-29 | Disposition: A | Discharge status: Skilled Nursing Facility | Payer: Medicare HMO | Attending: Emergency Medicine | Admitting: Emergency Medicine

## 2012-11-29 ENCOUNTER — Inpatient Hospital Stay (HOSPITAL_COMMUNITY)
Admission: EM | Admit: 2012-11-29 | Discharge: 2012-12-05 | DRG: 101 | Disposition: A | Discharge status: Skilled Nursing Facility | Payer: Medicare HMO | Attending: Internal Medicine | Admitting: Internal Medicine

## 2012-11-29 DIAGNOSIS — R569 Unspecified convulsions: Secondary | ICD-10-CM

## 2012-11-29 DIAGNOSIS — M25569 Pain in unspecified knee: Secondary | ICD-10-CM | POA: Diagnosis present

## 2012-11-29 DIAGNOSIS — S0181XA Laceration without foreign body of other part of head, initial encounter: Secondary | ICD-10-CM

## 2012-11-29 DIAGNOSIS — G40909 Epilepsy, unspecified, not intractable, without status epilepticus: Principal | ICD-10-CM | POA: Diagnosis present

## 2012-11-29 DIAGNOSIS — S0180XA Unspecified open wound of other part of head, initial encounter: Secondary | ICD-10-CM | POA: Insufficient documentation

## 2012-11-29 DIAGNOSIS — Z8739 Personal history of other diseases of the musculoskeletal system and connective tissue: Secondary | ICD-10-CM | POA: Insufficient documentation

## 2012-11-29 DIAGNOSIS — N183 Chronic kidney disease, stage 3 unspecified: Secondary | ICD-10-CM | POA: Insufficient documentation

## 2012-11-29 DIAGNOSIS — F329 Major depressive disorder, single episode, unspecified: Secondary | ICD-10-CM | POA: Diagnosis present

## 2012-11-29 DIAGNOSIS — F3289 Other specified depressive episodes: Secondary | ICD-10-CM | POA: Diagnosis present

## 2012-11-29 DIAGNOSIS — Z79899 Other long term (current) drug therapy: Secondary | ICD-10-CM | POA: Insufficient documentation

## 2012-11-29 DIAGNOSIS — Z862 Personal history of diseases of the blood and blood-forming organs and certain disorders involving the immune mechanism: Secondary | ICD-10-CM | POA: Insufficient documentation

## 2012-11-29 DIAGNOSIS — Z8619 Personal history of other infectious and parasitic diseases: Secondary | ICD-10-CM | POA: Insufficient documentation

## 2012-11-29 DIAGNOSIS — M129 Arthropathy, unspecified: Secondary | ICD-10-CM | POA: Diagnosis present

## 2012-11-29 DIAGNOSIS — Y9210 Unspecified residential institution as the place of occurrence of the external cause: Secondary | ICD-10-CM | POA: Insufficient documentation

## 2012-11-29 DIAGNOSIS — R4182 Altered mental status, unspecified: Secondary | ICD-10-CM

## 2012-11-29 DIAGNOSIS — W010XXA Fall on same level from slipping, tripping and stumbling without subsequent striking against object, initial encounter: Secondary | ICD-10-CM | POA: Diagnosis present

## 2012-11-29 DIAGNOSIS — Z8659 Personal history of other mental and behavioral disorders: Secondary | ICD-10-CM | POA: Insufficient documentation

## 2012-11-29 DIAGNOSIS — I129 Hypertensive chronic kidney disease with stage 1 through stage 4 chronic kidney disease, or unspecified chronic kidney disease: Secondary | ICD-10-CM | POA: Diagnosis present

## 2012-11-29 DIAGNOSIS — I10 Essential (primary) hypertension: Secondary | ICD-10-CM | POA: Diagnosis present

## 2012-11-29 DIAGNOSIS — R1310 Dysphagia, unspecified: Secondary | ICD-10-CM | POA: Diagnosis present

## 2012-11-29 DIAGNOSIS — D509 Iron deficiency anemia, unspecified: Secondary | ICD-10-CM | POA: Diagnosis present

## 2012-11-29 DIAGNOSIS — G92 Toxic encephalopathy: Secondary | ICD-10-CM

## 2012-11-29 DIAGNOSIS — C787 Secondary malignant neoplasm of liver and intrahepatic bile duct: Secondary | ICD-10-CM | POA: Diagnosis present

## 2012-11-29 DIAGNOSIS — R471 Dysarthria and anarthria: Secondary | ICD-10-CM | POA: Diagnosis present

## 2012-11-29 DIAGNOSIS — Y9389 Activity, other specified: Secondary | ICD-10-CM | POA: Insufficient documentation

## 2012-11-29 DIAGNOSIS — Z8639 Personal history of other endocrine, nutritional and metabolic disease: Secondary | ICD-10-CM | POA: Insufficient documentation

## 2012-11-29 DIAGNOSIS — Z96649 Presence of unspecified artificial hip joint: Secondary | ICD-10-CM

## 2012-11-29 DIAGNOSIS — Z8673 Personal history of transient ischemic attack (TIA), and cerebral infarction without residual deficits: Secondary | ICD-10-CM | POA: Diagnosis present

## 2012-11-29 DIAGNOSIS — R4701 Aphasia: Secondary | ICD-10-CM | POA: Diagnosis present

## 2012-11-29 DIAGNOSIS — D499 Neoplasm of unspecified behavior of unspecified site: Secondary | ICD-10-CM | POA: Diagnosis present

## 2012-11-29 DIAGNOSIS — W1809XA Striking against other object with subsequent fall, initial encounter: Secondary | ICD-10-CM | POA: Insufficient documentation

## 2012-11-29 DIAGNOSIS — E785 Hyperlipidemia, unspecified: Secondary | ICD-10-CM | POA: Diagnosis present

## 2012-11-29 DIAGNOSIS — G9340 Encephalopathy, unspecified: Secondary | ICD-10-CM | POA: Diagnosis present

## 2012-11-29 DIAGNOSIS — C7B8 Other secondary neuroendocrine tumors: Secondary | ICD-10-CM | POA: Diagnosis present

## 2012-11-29 LAB — CBC WITH DIFFERENTIAL/PLATELET
Basophils Absolute: 0 10*3/uL (ref 0.0–0.1)
Basophils Relative: 1 % (ref 0–1)
Eosinophils Absolute: 0.1 10*3/uL (ref 0.0–0.7)
Eosinophils Relative: 1 % (ref 0–5)
Lymphocytes Relative: 17 % (ref 12–46)
MCH: 29.4 pg (ref 26.0–34.0)
MCHC: 33.2 g/dL (ref 30.0–36.0)
MCV: 88.6 fL (ref 78.0–100.0)
Platelets: 192 10*3/uL (ref 150–400)
RDW: 13.8 % (ref 11.5–15.5)
WBC: 6.8 10*3/uL (ref 4.0–10.5)

## 2012-11-29 LAB — COMPREHENSIVE METABOLIC PANEL
ALT: 11 U/L (ref 0–35)
AST: 20 U/L (ref 0–37)
Albumin: 4.2 g/dL (ref 3.5–5.2)
Calcium: 10.7 mg/dL — ABNORMAL HIGH (ref 8.4–10.5)
Sodium: 141 mEq/L (ref 135–145)
Total Protein: 7.7 g/dL (ref 6.0–8.3)

## 2012-11-29 LAB — URINALYSIS, ROUTINE W REFLEX MICROSCOPIC
Bilirubin Urine: NEGATIVE
Hgb urine dipstick: NEGATIVE
Specific Gravity, Urine: 1.007 (ref 1.005–1.030)
pH: 7 (ref 5.0–8.0)

## 2012-11-29 LAB — GLUCOSE, CAPILLARY: Glucose-Capillary: 108 mg/dL — ABNORMAL HIGH (ref 70–99)

## 2012-11-29 MED ORDER — LORAZEPAM 2 MG/ML IJ SOLN: 1.0000 mg | Freq: Once | INTRAMUSCULAR | Status: DC

## 2012-11-29 MED ORDER — LEVETIRACETAM 500 MG PO TABS: 500.0000 mg | ORAL_TABLET | Freq: Two times a day (BID) | ORAL | Status: DC

## 2012-11-29 MED ORDER — LEVETIRACETAM 500 MG PO TABS
1000.0000 mg | ORAL_TABLET | ORAL | Status: AC
  Administered 2012-11-29: 1000 mg via ORAL
  Filled 2012-11-29: qty 2

## 2012-11-29 MED ORDER — LORAZEPAM 2 MG/ML IJ SOLN: 1.0000 mg | Freq: Once | INTRAMUSCULAR | Status: DC | PRN

## 2012-11-29 NOTE — ED Notes (Signed)
Checked patient blood sugar it was 121 notified RN Dorene Grebe

## 2012-11-29 NOTE — ED Provider Notes (Signed)
CSN: [id number]     Arrival date & time 11/29/12  2255 History   First MD Initiated Contact with Patient 11/29/12 2305     Chief Complaint  Patient presents with  . Altered Mental Status   (Consider location/radiation/quality/duration/timing/severity/associated sxs/prior Treatment) Patient is a 67 y.o. female presenting with altered mental status. The history is provided by the EMS personnel and medical records. No language interpreter was used.  Altered Mental Status Presenting symptoms: no unresponsiveness   Presenting symptoms comment:  Non verbal Severity:  Moderate Most recent episode:  Today Episode history:  Continuous Timing:  Constant Progression:  Unchanged Context: nursing home resident   Associated symptoms: no rash and no weakness     Past Medical History  Diagnosis Date  . 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 Myna Hidalgo w/sandostatin injections q monthly  . Anemia     iron deficiency  . Depression   . Hyperlipidemia   . Hypertension   . History of cocaine abuse     quit 03/06; seizure and HTN urgency secondary to use 12/04  . Seizures   . 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 Weight loss     15 lbs 08/07 (regained 104 08/07 and 112 lbs 12/07)  . Arthritis   . Halitosis     per notes  . Herpes zoster of eye 04/09    right eye  . Chronic kidney disease, stage III (moderate)     BL Cr 1.2-1.3   Past Surgical History  Procedure Laterality Date  . Hip arthroplasty Right 07/24/2012    Procedure: ARTHROPLASTY BIPOLAR HIP;  Surgeon: Verlee Rossetti, MD;  Location: [facility] OR;  Service: Orthopedics;  Laterality: Right;   Family History  Problem Relation Age of Onset  . Cancer Mother   .  Cancer Father   . Diabetes Brother    History  Substance Use Topics  . Smoking status: Never Smoker   . Smokeless tobacco: Not on file  . Alcohol Use: No   OB History   Grav Para Term Preterm Abortions TAB SAB Ect Mult Living                 Review of Systems  Unable to perform ROS Skin: Negative for rash.  Neurological: Negative for weakness.    Allergies  Review of patient's allergies indicates no known allergies.  Home Medications   Current Outpatient Rx  Name  Route  Sig  Dispense  Refill  . amLODipine (NORVASC) 10 MG tablet   Oral   Take 1 tablet (10 mg total) by mouth daily.   30 tablet   5   . cloNIDine (CATAPRES) 0.2 MG tablet   Oral   Take 1 tablet (0.2 mg total) by mouth 3 (three) times daily.   90 tablet   5   . levETIRAcetam (KEPPRA) 500 MG tablet   Oral   Take 1 tablet (500 mg total) by mouth 2 (two) times daily.   60 tablet   0   . lisinopril (PRINIVIL,ZESTRIL) 20 MG tablet   Oral   Take 1 tablet (20 mg total) by mouth daily.   30 tablet   5   . Multiple Vitamins-Iron (MULTIVITAMIN/IRON) TABS   Oral  Take 1 tablet by mouth daily.           There were no vitals taken for this visit. Physical Exam  Constitutional: She appears well-developed and well-nourished. No distress.  HENT:  Head: Normocephalic.    Right Ear: No hemotympanum.  Left Ear: No hemotympanum.  Mouth/Throat: Oropharynx is clear and moist.  Eyes: Conjunctivae are normal. Pupils are equal, round, and reactive to light.  Neck: Normal range of motion. Neck supple. No thyromegaly present.  Cardiovascular: Normal rate, regular rhythm and intact distal pulses.   Pulmonary/Chest: Effort normal and breath sounds normal. She has no wheezes. She has no rales.  Abdominal: Soft. Bowel sounds are normal. There is no tenderness. There is no rebound and no guarding.  Musculoskeletal: She exhibits no edema.  Neurological: She is alert. She has normal reflexes.  Skin: Skin is warm  and dry. She is not diaphoretic.  Psychiatric:  States she wants a blanket    ED Course  Procedures (including critical care time) Labs Review Labs Reviewed  CBC WITH DIFFERENTIAL  URINALYSIS, ROUTINE W REFLEX MICROSCOPIC   Imaging Review Ct Head Wo Contrast  11/29/2012   CLINICAL DATA:  Seizure with fall, laceration above left eye  EXAM: CT HEAD WITHOUT CONTRAST  CT CERVICAL SPINE WITHOUT CONTRAST  TECHNIQUE: Multidetector CT imaging of the head and cervical spine was performed following the standard protocol without intravenous contrast. Multiplanar CT image reconstructions of the cervical spine were also generated.  COMPARISON:  Head CT 05/11/2003, brain MRI 07/23/2012  FINDINGS: CT HEAD FINDINGS  Exam degraded by patient head motion exam was repeated with some improvement. No acute intracranial hemorrhage. No focal mass lesion. No CT evidence of acute infarction. No midline shift or mass effect. No hydrocephalus. Basilar cisterns are patent.  There is mild generalized cortical atrophy. There are periventricular and subcortical white matter hypodensities. There is a deep white matter infarction in the left external capsule unchanged from prior.  Paranasal sinuses and  mastoid air cells are clear.  CT CERVICAL SPINE FINDINGS  No prevertebral soft tissue swelling. There is straightening of the normal cervical lordosis. There is joint space narrowing and osteophytosis at C6-C7. Normal facet articulation. Normal craniocervical junction. No evidence of epidural or paraspinal hematoma  IMPRESSION: CT HEAD IMPRESSION  No intracranial trauma. Remote infarction left basal ganglia. Chronic atrophy and microvascular disease.  CT CERVICAL SPINE IMPRESSION  No cervical spine fracture. Mild disc osteophytic disease.  Straightening of the normal cervical lordosis may be secondary to position, muscle spasm, or ligamentous injury.   Electronically Signed   By: Genevive Bi M.D.   On: 11/29/2012 10:33   Ct  Cervical Spine Wo Contrast  11/29/2012   CLINICAL DATA:  Seizure with fall, laceration above left eye  EXAM: CT HEAD WITHOUT CONTRAST  CT CERVICAL SPINE WITHOUT CONTRAST  TECHNIQUE: Multidetector CT imaging of the head and cervical spine was performed following the standard protocol without intravenous contrast. Multiplanar CT image reconstructions of the cervical spine were also generated.  COMPARISON:  Head CT 05/11/2003, brain MRI 07/23/2012  FINDINGS: CT HEAD FINDINGS  Exam degraded by patient head motion exam was repeated with some improvement. No acute intracranial hemorrhage. No focal mass lesion. No CT evidence of acute infarction. No midline shift or mass effect. No hydrocephalus. Basilar cisterns are patent.  There is mild generalized cortical atrophy. There are periventricular and subcortical white matter hypodensities. There is a deep white matter infarction in the left external capsule unchanged from  prior.  Paranasal sinuses and  mastoid air cells are clear.  CT CERVICAL SPINE FINDINGS  No prevertebral soft tissue swelling. There is straightening of the normal cervical lordosis. There is joint space narrowing and osteophytosis at C6-C7. Normal facet articulation. Normal craniocervical junction. No evidence of epidural or paraspinal hematoma  IMPRESSION: CT HEAD IMPRESSION  No intracranial trauma. Remote infarction left basal ganglia. Chronic atrophy and microvascular disease.  CT CERVICAL SPINE IMPRESSION  No cervical spine fracture. Mild disc osteophytic disease.  Straightening of the normal cervical lordosis may be secondary to position, muscle spasm, or ligamentous injury.   Electronically Signed   By: Genevive Bi M.D.   On: 11/29/2012 10:33    MDM  No diagnosis found.  Date: 11/30/2012  Rate: 70  Rhythm: normal sinus rhythm  QRS Axis: normal  Intervals: normal  ST/T Wave abnormalities: nonspecific ST changes  Conduction Disutrbances:none  Narrative Interpretation: inferiorlateral  t wave inversion  Old EKG Reviewed: changes noted    MDM Reviewed: previous chart, nursing note and vitals Reviewed previous: labs, CT scan and ECG Interpretation: labs, ECG, x-ray and CT scan Consults: admitting MD     Carey Lafon K Waylynn Benefiel-Rasch, MD 11/30/12 2130

## 2012-11-29 NOTE — ED Provider Notes (Addendum)
CSN: [id number]     Arrival date & time 11/29/12  4098 History   First MD Initiated Contact with Patient 11/29/12 [phone]     Chief Complaint  Patient presents with  . Seizures  . Fall   (Consider location/radiation/quality/duration/timing/severity/associated sxs/prior Treatment) Patient is a 67 y.o. female presenting with seizures. The history is provided by the EMS personnel and a relative. The history is limited by the condition of the patient.  Seizures Seizure activity on arrival: no   Seizure type:  Unable to specify Initial focality:  None Episode characteristics comment:  Pt lives in rehab right now and her roommate states she was in a chair and startted shaking and then fell on the floor and hit her face Postictal symptoms: confusion   Postictal symptoms comment:  Drowsy Return to baseline: yes   Severity:  Moderate Timing:  Once Number of seizures this episode:  1 Progression:  Resolved Context: not alcohol withdrawal, not change in medication and medical compliance   Context comment:  Previous sx due to hemorrhagic CVA from [facility] emergency and cocaine use Recent head injury:  No recent head injuries PTA treatment:  None History of seizures: yes   Date of initial seizure episode:  2006 Date of most recent prior episode:  Unknown Current therapy:  None   Past Medical History  Diagnosis Date  . 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 Myna Hidalgo w/sandostatin injections q monthly  . Anemia     iron deficiency  . Depression   . Hyperlipidemia   . Hypertension   . History of cocaine abuse     quit 03/06; seizure and HTN urgency secondary to use 12/04  . Seizures   . 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 Weight loss     15 lbs 08/07 (regained 104 08/07 and 112 lbs 12/07)  . Arthritis   . Halitosis     per notes  . Herpes zoster of eye 04/09    right eye  . Chronic kidney disease, stage III (moderate)     BL Cr 1.2-1.3   Past Surgical History  Procedure Laterality Date  . Hip arthroplasty Right 07/24/2012    Procedure: ARTHROPLASTY BIPOLAR HIP;  Surgeon: Verlee Rossetti, MD;  Location: [facility] OR;  Service: Orthopedics;  Laterality: Right;   Family History  Problem Relation Age of Onset  . Cancer Mother   . Cancer Father   . Diabetes Brother    History  Substance Use Topics  . Smoking status: Never Smoker   . Smokeless tobacco: Not on file  . Alcohol Use: No   OB History   Grav Para Term Preterm Abortions TAB SAB Ect Mult Living                 Review of Systems  Neurological: Positive for seizures.  All other systems reviewed and are negative.    Allergies  Review of patient's allergies indicates no known allergies.  Home Medications   Current Outpatient Rx  Name  Route  Sig  Dispense  Refill  . amLODipine (NORVASC) 10 MG tablet   Oral   Take 1 tablet (10 mg total) by mouth daily.   30 tablet   5   .  cloNIDine (CATAPRES) 0.2 MG tablet   Oral   Take 1 tablet (0.2 mg total) by mouth 3 (three) times daily.   90 tablet   5   . lisinopril (PRINIVIL,ZESTRIL) 20 MG tablet   Oral   Take 1 tablet (20 mg total) by mouth daily.   30 tablet   5   . Multiple Vitamins-Iron (MULTIVITAMIN/IRON) TABS   Oral   Take 1 tablet by mouth daily.           BP 198/84  Pulse 94  Temp(Src) 98.2 F (36.8 C) (Oral)  Resp 22  SpO2 100% Physical Exam  Nursing note and vitals reviewed. Constitutional: She appears well-developed and well-nourished. No distress.  Bruxism  HENT:  Head: Normocephalic. Head is with laceration.    Mouth/Throat: Oropharynx is clear and moist.  Eyes: Conjunctivae and EOM are normal. Pupils are equal, round, and reactive to light.  Neck:  Normal range of motion and full passive range of motion without pain. Neck supple. No spinous process tenderness and no muscular tenderness present. Normal range of motion present.  Cardiovascular: Normal rate, regular rhythm and intact distal pulses.   No murmur heard. Pulmonary/Chest: Effort normal and breath sounds normal. No respiratory distress. She has no wheezes. She has no rales.  Abdominal: Soft. She exhibits no distension. There is no tenderness. There is no rebound and no guarding.  Musculoskeletal: Normal range of motion. She exhibits edema. She exhibits no tenderness.  Trace edema to RLE  Neurological: She is alert.  4/5 strength in the right upper ext  (chronic), 4/5 strength in RLE (chronic).  Awake and alert  Skin: Skin is warm and dry. No rash noted. No erythema.  Psychiatric: She has a normal mood and affect. Her behavior is normal.    ED Course  Procedures (including critical care time) Labs Review Labs Reviewed  COMPREHENSIVE METABOLIC PANEL - Abnormal; Notable for the following:    Glucose, Bld 116 (*)    Calcium 10.7 (*)    GFR calc non Af Amer 69 (*)    GFR calc Af Amer 80 (*)    All other components within normal limits  URINALYSIS, ROUTINE W REFLEX MICROSCOPIC - Abnormal; Notable for the following:    Color, Urine COLORLESS (*)    All other components within normal limits  CBC WITH DIFFERENTIAL   Imaging Review Ct Head Wo Contrast  11/29/2012   CLINICAL DATA:  Seizure with fall, laceration above left eye  EXAM: CT HEAD WITHOUT CONTRAST  CT CERVICAL SPINE WITHOUT CONTRAST  TECHNIQUE: Multidetector CT imaging of the head and cervical spine was performed following the standard protocol without intravenous contrast. Multiplanar CT image reconstructions of the cervical spine were also generated.  COMPARISON:  Head CT 05/11/2003, brain MRI 07/23/2012  FINDINGS: CT HEAD FINDINGS  Exam degraded by patient head motion exam was repeated with some improvement. No acute  intracranial hemorrhage. No focal mass lesion. No CT evidence of acute infarction. No midline shift or mass effect. No hydrocephalus. Basilar cisterns are patent.  There is mild generalized cortical atrophy. There are periventricular and subcortical white matter hypodensities. There is a deep white matter infarction in the left external capsule unchanged from prior.  Paranasal sinuses and  mastoid air cells are clear.  CT CERVICAL SPINE FINDINGS  No prevertebral soft tissue swelling. There is straightening of the normal cervical lordosis. There is joint space narrowing and osteophytosis at C6-C7. Normal facet articulation. Normal craniocervical junction. No evidence of epidural or  paraspinal hematoma  IMPRESSION: CT HEAD IMPRESSION  No intracranial trauma. Remote infarction left basal ganglia. Chronic atrophy and microvascular disease.  CT CERVICAL SPINE IMPRESSION  No cervical spine fracture. Mild disc osteophytic disease.  Straightening of the normal cervical lordosis may be secondary to position, muscle spasm, or ligamentous injury.   Electronically Signed   By: Genevive Bi M.D.   On: 11/29/2012 10:33   Ct Cervical Spine Wo Contrast  11/29/2012   CLINICAL DATA:  Seizure with fall, laceration above left eye  EXAM: CT HEAD WITHOUT CONTRAST  CT CERVICAL SPINE WITHOUT CONTRAST  TECHNIQUE: Multidetector CT imaging of the head and cervical spine was performed following the standard protocol without intravenous contrast. Multiplanar CT image reconstructions of the cervical spine were also generated.  COMPARISON:  Head CT 05/11/2003, brain MRI 07/23/2012  FINDINGS: CT HEAD FINDINGS  Exam degraded by patient head motion exam was repeated with some improvement. No acute intracranial hemorrhage. No focal mass lesion. No CT evidence of acute infarction. No midline shift or mass effect. No hydrocephalus. Basilar cisterns are patent.  There is mild generalized cortical atrophy. There are periventricular and subcortical  white matter hypodensities. There is a deep white matter infarction in the left external capsule unchanged from prior.  Paranasal sinuses and  mastoid air cells are clear.  CT CERVICAL SPINE FINDINGS  No prevertebral soft tissue swelling. There is straightening of the normal cervical lordosis. There is joint space narrowing and osteophytosis at C6-C7. Normal facet articulation. Normal craniocervical junction. No evidence of epidural or paraspinal hematoma  IMPRESSION: CT HEAD IMPRESSION  No intracranial trauma. Remote infarction left basal ganglia. Chronic atrophy and microvascular disease.  CT CERVICAL SPINE IMPRESSION  No cervical spine fracture. Mild disc osteophytic disease.  Straightening of the normal cervical lordosis may be secondary to position, muscle spasm, or ligamentous injury.   Electronically Signed   By: Genevive Bi M.D.   On: 11/29/2012 10:33    Date: 11/29/2012  Rate: 94  Rhythm: normal sinus rhythm  QRS Axis: normal  Intervals: normal  ST/T Wave abnormalities: nonspecific ST/T changes  Conduction Disutrbances:right bundle branch block  Narrative Interpretation:   Old EKG Reviewed: unchanged  LACERATION REPAIR Performed by: Gwyneth Sprout Authorized byGwyneth Sprout Consent: Verbal consent obtained. Risks and benefits: risks, benefits and alternatives were discussed Consent given by: patient Patient identity confirmed: provided demographic data Prepped and Draped in normal sterile fashion Wound explored  Laceration Location: left forehead   Laceration Length: 6cm  No Foreign Bodies seen or palpated  Anesthesia: local infiltration  Local anesthetic: lidocaine 1% with epinephrine  Anesthetic total: 4 ml  Irrigation method: syringe Amount of cleaning: standard  Skin closure: 6.0 Prolene   Number of sutures: 11   Technique: Simple interrupted   Patient tolerance: Patient tolerated the procedure well with no immediate complications.   MDM   1.  Seizure   2. Facial laceration, initial encounter     Patient with a history of seizures but no seizures recently after having a hemorrhagic CVA do to cocaine abuse and hypertensive emergency presents today after having a seizure while sitting in a chair and falling forward on the floor sustaining a laceration above her left eye. Patient is now back to baseline. She currently does not take any antiseizure prophylaxis. Family was unaware that she had a history of seizures however during that period of her life they were not aware of her medical history due to her drug use and alienation from them. She  is now back to baseline which they confirm. She has no complaints of neck pain, back pain or abdominal pain.  CBC, CMP, UA, head CT, C-spine pending. Tetanus shot is up to date. Laceration repair as above.  Labs all wnl.  Head/Cspine CT neg.  Spoke with neurology adn will start pt on keppra for sz prevention.  Loaded with 1000mg  and will start keppra bid.    Gwyneth Sprout, MD 11/29/12 1131  Gwyneth Sprout, MD 11/29/12 1134

## 2012-11-29 NOTE — ED Notes (Signed)
Lab at bedside

## 2012-11-29 NOTE — ED Notes (Signed)
Registration at bedside.

## 2012-11-29 NOTE — ED Notes (Signed)
Per EMS: Pt from [facility] with c/o increased AMS. Pt seen here today after fall, but per facility pt not at baseline since returned to facility. Per facility, pt normally verbal but has been nonverbal on arrival. Pt verbal with EMS on IV assertion. 138/74. 78 SR. 99 % RA. CBG 115.

## 2012-11-29 NOTE — ED Notes (Signed)
Pt coming from [facility]. Per ems - pt was sitting in her wheelchair when she started to seize and fell out of her wheelchair on to the floor. Pt has laceration above left eyebrow. Pt was post-ictal upon arrival. Pt grinding teeth. BP 210/106 HR 84. ems placed pt in c-collar. CBG 136. EMS started 18G in right forearm.

## 2012-11-29 NOTE — ED Notes (Signed)
Pt returned from radiology. Family at bedside 

## 2012-11-30 ENCOUNTER — Inpatient Hospital Stay (HOSPITAL_COMMUNITY): Payer: Medicare HMO

## 2012-11-30 ENCOUNTER — Other Ambulatory Visit: Payer: Medicare HMO | Admitting: Lab

## 2012-11-30 ENCOUNTER — Emergency Department (HOSPITAL_COMMUNITY): Payer: Medicare HMO

## 2012-11-30 ENCOUNTER — Other Ambulatory Visit: Payer: Self-pay | Admitting: *Deleted

## 2012-11-30 ENCOUNTER — Ambulatory Visit: Payer: Medicare HMO

## 2012-11-30 ENCOUNTER — Encounter (HOSPITAL_COMMUNITY): Payer: Self-pay | Admitting: Internal Medicine

## 2012-11-30 DIAGNOSIS — R569 Unspecified convulsions: Secondary | ICD-10-CM

## 2012-11-30 DIAGNOSIS — G9340 Encephalopathy, unspecified: Secondary | ICD-10-CM | POA: Diagnosis present

## 2012-11-30 DIAGNOSIS — R4182 Altered mental status, unspecified: Secondary | ICD-10-CM

## 2012-11-30 LAB — LIPID PANEL
Cholesterol: 175 mg/dL (ref 0–200)
HDL: 82 mg/dL (ref >39)
Total CHOL/HDL Ratio: 2.1 RATIO
Triglycerides: 58 mg/dL (ref <150)
VLDL: 12 mg/dL (ref 0–40)

## 2012-11-30 LAB — URINALYSIS, ROUTINE W REFLEX MICROSCOPIC
Glucose, UA: NEGATIVE mg/dL
Hgb urine dipstick: NEGATIVE
Leukocytes, UA: NEGATIVE
Protein, ur: 30 mg/dL — AB
pH: 6.5 (ref 5.0–8.0)

## 2012-11-30 LAB — CBC WITH DIFFERENTIAL/PLATELET
Basophils Absolute: 0 10*3/uL (ref 0.0–0.1)
Basophils Relative: 1 % (ref 0–1)
Eosinophils Absolute: 0 10*3/uL (ref 0.0–0.7)
Eosinophils Absolute: 0.1 10*3/uL (ref 0.0–0.7)
Eosinophils Relative: 0 % (ref 0–5)
HCT: 34.1 % — ABNORMAL LOW (ref 36.0–46.0)
Hemoglobin: 11.5 g/dL — ABNORMAL LOW (ref 12.0–15.0)
Hemoglobin: 11.3 g/dL — ABNORMAL LOW (ref 12.0–15.0)
Lymphocytes Relative: 13 % (ref 12–46)
Lymphs Abs: 0.9 10*3/uL (ref 0.7–4.0)
MCH: 30.3 pg (ref 26.0–34.0)
MCH: 29.3 pg (ref 26.0–34.0)
MCHC: 33.1 g/dL (ref 30.0–36.0)
MCV: 87.9 fL (ref 78.0–100.0)
Monocytes Absolute: 0.8 10*3/uL (ref 0.1–1.0)
Monocytes Relative: 9 % (ref 3–12)
Monocytes Relative: 13 % — ABNORMAL HIGH (ref 3–12)
Neutrophils Relative %: 77 % (ref 43–77)
Neutrophils Relative %: 65 % (ref 43–77)
RBC: 3.8 MIL/uL — ABNORMAL LOW (ref 3.87–5.11)
RDW: 13.6 % (ref 11.5–15.5)
WBC: 6.6 10*3/uL (ref 4.0–10.5)

## 2012-11-30 LAB — RAPID URINE DRUG SCREEN, HOSP PERFORMED: Benzodiazepines: NOT DETECTED

## 2012-11-30 LAB — URINE MICROSCOPIC-ADD ON

## 2012-11-30 LAB — COMPREHENSIVE METABOLIC PANEL
ALT: 12 U/L (ref 0–35)
Alkaline Phosphatase: 67 U/L (ref 39–117)
BUN: 18 mg/dL (ref 6–23)
Chloride: 102 mEq/L (ref 96–112)
GFR calc Af Amer: 64 mL/min — ABNORMAL LOW (ref >90)
Glucose, Bld: 94 mg/dL (ref 70–99)
Potassium: 3.8 mEq/L (ref 3.5–5.1)
Sodium: 140 mEq/L (ref 135–145)
Total Bilirubin: 1.1 mg/dL (ref 0.3–1.2)
Total Protein: 7.2 g/dL (ref 6.0–8.3)

## 2012-11-30 LAB — POCT I-STAT, CHEM 8
BUN: 16 mg/dL (ref 6–23)
Calcium, Ion: 1.29 mmol/L (ref 1.13–1.30)
Chloride: 103 mEq/L (ref 96–112)
HCT: 36 % (ref 36.0–46.0)
Potassium: 3.9 mEq/L (ref 3.5–5.1)
Sodium: 143 mEq/L (ref 135–145)

## 2012-11-30 LAB — POCT I-STAT TROPONIN I: Troponin i, poc: 0.01 ng/mL (ref 0.00–0.08)

## 2012-11-30 LAB — AMMONIA: Ammonia: 41 umol/L (ref 11–60)

## 2012-11-30 LAB — HEMOGLOBIN A1C
Hgb A1c MFr Bld: 6.2 % — ABNORMAL HIGH (ref <5.7)
Mean Plasma Glucose: 131 mg/dL — ABNORMAL HIGH (ref <117)

## 2012-11-30 LAB — PHOSPHORUS: Phosphorus: 4 mg/dL (ref 2.3–4.6)

## 2012-11-30 MED ORDER — LORAZEPAM 2 MG/ML IJ SOLN: 0.5000 mg | Freq: Once | INTRAMUSCULAR | Status: DC

## 2012-11-30 MED ORDER — SODIUM CHLORIDE 0.9 % IV SOLN
500.0000 mg | Freq: Two times a day (BID) | INTRAVENOUS | Status: DC
  Administered 2012-11-30 – 2012-12-01 (×3): 500 mg via INTRAVENOUS
  Filled 2012-11-30 (×4): qty 5

## 2012-11-30 MED ORDER — LISINOPRIL 20 MG PO TABS
20.0000 mg | ORAL_TABLET | Freq: Every day | ORAL | Status: DC
  Administered 2012-12-03 – 2012-12-05 (×3): 20 mg via ORAL
  Filled 2012-11-30 (×6): qty 1

## 2012-11-30 MED ORDER — ACETAMINOPHEN 325 MG PO TABS: 650.0000 mg | ORAL_TABLET | ORAL | Status: DC | PRN

## 2012-11-30 MED ORDER — SODIUM CHLORIDE 0.9 % IV SOLN
INTRAVENOUS | Status: AC
  Administered 2012-11-30: 05:00:00 via INTRAVENOUS

## 2012-11-30 MED ORDER — CLONIDINE HCL 0.2 MG PO TABS
0.2000 mg | ORAL_TABLET | Freq: Three times a day (TID) | ORAL | Status: DC
  Administered 2012-11-30 – 2012-12-05 (×9): 0.2 mg via ORAL
  Filled 2012-11-30 (×19): qty 1

## 2012-11-30 MED ORDER — AMLODIPINE BESYLATE 10 MG PO TABS
10.0000 mg | ORAL_TABLET | Freq: Every day | ORAL | Status: DC
  Administered 2012-12-03 – 2012-12-05 (×3): 10 mg via ORAL
  Filled 2012-11-30 (×6): qty 1

## 2012-11-30 MED ORDER — BIOTENE DRY MOUTH MT LIQD: 15.0000 mL | Freq: Two times a day (BID) | OROMUCOSAL | Status: DC

## 2012-11-30 NOTE — Clinical Social Work Placement (Signed)
Clinical Social Work Department CLINICAL SOCIAL WORK PLACEMENT NOTE 11/30/2012  Patient:  [name], [name]  Account Number:  [id number] Admit date:  11/29/2012  Clinical Social Worker:  Irving Burton [place], LCSWA  Date/time:  11/30/2012 03:35 PM  Clinical Social Work is seeking post-discharge placement for this patient at the following level of care:   SKILLED NURSING   (*CSW will update this form in Epic as items are completed)   11/30/2012  Patient/family provided with [facility] Department of Clinical Social Works list of facilities offering this level of care within the geographic area requested by the patient (or if unable, by the patients family).  11/30/2012  Patient/family informed of their freedom to choose among providers that offer the needed level of care, that participate in Medicare, Medicaid or managed care program needed by the patient, have an available bed and are willing to accept the patient.  11/30/2012  Patient/family informed of [facility] ownership interest in [facility], as well as of the fact that they are under no obligation to receive care at this facility.  PASARR submitted to EDS on 11/30/2012 PASARR number received from EDS on 11/30/2012  FL2 transmitted to all facilities in geographic area requested by pt/family on  11/30/2012 FL2 transmitted to all facilities within larger geographic area on   Patient informed that his/her managed care company has contracts with or will negotiate with  certain facilities, including the following:     Patient/family informed of bed offers received:   Patient chooses bed at  Physician recommends and patient chooses bed at    Patient to be transferred to  on   Patient to be transferred to facility by   The following physician request were entered in Epic:   Additional Comments:  Darlyn Chamber, MSW, LCSWA Clinical Social Work [phone]

## 2012-11-30 NOTE — Progress Notes (Addendum)
Internal Medicine Attending I saw and evaluated the patient, and discussed her care with resident Dr. Bosie Clos.  Patient was admitted to the hospitalist service from skilled nursing facility with seizure; she was transferred today to the internal medicine service.  Neurology consult was obtained, and workup is in progress.  I agree with the clinical findings and plans as outlined in the note by Dr. Bosie Clos.  Dr. Kem Kays will cover as attending physician this weekend, and Dr. Josem Kaufmann will take over on Monday 12/03/2012.

## 2012-11-30 NOTE — Progress Notes (Signed)
EEG completed; results pending.    

## 2012-11-30 NOTE — Consult Note (Addendum)
Reason for Consult:AMS Referring Physician: Toniann Fail  CC: Altered mental status and seizures  HPI: [name] is an 67 y.o. female who presented on 9/11 after having had a seizure at her facility.  Patient was evaluated with a head CT and was loaded with Keppra and started on a maintenance dosage.  It is unclear if she was at baseline when discharged.  She returned to he facility and per their report was never at baseline and did not return to her baseline while there.  She was sent back to the ED for further evaluation.    Past Medical History  Diagnosis Date  . 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 Myna Hidalgo w/sandostatin injections q monthly  . Anemia     iron deficiency  . Depression   . Hyperlipidemia   . Hypertension   . History of cocaine abuse     quit 03/06; seizure and HTN urgency secondary to use 12/04  . Seizures   . 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 Weight loss     15 lbs 08/07 (regained 104 08/07 and 112 lbs 12/07)  . Arthritis   . Halitosis     per notes  . Herpes zoster of eye 04/09    right eye  . Chronic kidney disease, stage III (moderate)     BL Cr 1.2-1.3    Past Surgical History  Procedure Laterality Date  . Hip arthroplasty Right 07/24/2012    Procedure: ARTHROPLASTY BIPOLAR HIP;  Surgeon: Verlee Rossetti, MD;  Location: [facility] OR;  Service: Orthopedics;  Laterality: Right;    Family History  Problem Relation Age of Onset  . Cancer Mother   . Cancer Father   . Diabetes Brother     Social History:  reports that she has never smoked. She does not have any smokeless tobacco history on file. She reports that she does not drink alcohol or use illicit drugs.  No Known  Allergies  Medications:  I have reviewed the patient's current medications. Prior to Admission:  Prescriptions prior to admission  Medication Sig Dispense Refill  . amLODipine (NORVASC) 10 MG tablet Take 1 tablet (10 mg total) by mouth daily.  30 tablet  5  . cloNIDine (CATAPRES) 0.2 MG tablet Take 1 tablet (0.2 mg total) by mouth 3 (three) times daily.  90 tablet  5  . levETIRAcetam (KEPPRA) 500 MG tablet Take 1 tablet (500 mg total) by mouth 2 (two) times daily.  60 tablet  0  . lisinopril (PRINIVIL,ZESTRIL) 20 MG tablet Take 1 tablet (20 mg total) by mouth daily.  30 tablet  5  . Multiple Vitamins-Iron (MULTIVITAMIN/IRON) TABS Take 1 tablet by mouth daily.        Scheduled: . amLODipine  10 mg Oral Daily  . cloNIDine  0.2 mg Oral TID  . levETIRAcetam  500 mg Intravenous Q12H  . lisinopril  20 mg Oral Daily    ROS: History obtained from the patient  General ROS: negative for - chills, fatigue, fever, night sweats, weight gain or weight loss Psychological ROS: negative for - behavioral disorder, hallucinations, memory difficulties, mood swings or suicidal ideation Ophthalmic ROS: negative for - blurry vision, double vision, eye pain or loss of  vision ENT ROS: negative for - epistaxis, nasal discharge, oral lesions, sore throat, tinnitus or vertigo Allergy and Immunology ROS: negative for - hives or itchy/watery eyes Hematological and Lymphatic ROS: negative for - bleeding problems, bruising or swollen lymph nodes Endocrine ROS: negative for - galactorrhea, hair pattern changes, polydipsia/polyuria or temperature intolerance Respiratory ROS: negative for - cough, hemoptysis, shortness of breath or wheezing Cardiovascular ROS: negative for - chest pain, dyspnea on exertion, edema or irregular heartbeat Gastrointestinal ROS: negative for - abdominal pain, diarrhea, hematemesis, nausea/vomiting or stool incontinence Genito-Urinary ROS: negative for - dysuria, hematuria, incontinence or  urinary frequency/urgency Musculoskeletal ROS: negative for - joint swelling or muscular weakness Neurological ROS: as noted in HPI Dermatological ROS: negative for rash and skin lesion changes  Physical Examination: Blood pressure 168/50, pulse 65, temperature 98.2 F (36.8 C), temperature source Oral, resp. rate 20, SpO2 97.00%.  Neurologic Examination Mental Status: Resting when I enter the room but easily awakened.  Speech is fluent.  Knows she is in the hospital but reports that it is July 1943.  Follows simple commands but has difficulty with 3-step commands.   Cranial Nerves: II: Discs flat bilaterally; Visual fields grossly normal, pupils equal, round, reactive to light and accommodation III,IV, VI: ptosis not present, extra-ocular motions intact bilaterally V,VII: decreased left NLF, facial light touch sensation normal bilaterally VIII: hearing normal bilaterally IX,X: gag reflex present XI: bilateral shoulder shrug XII: midline tongue extension Motor: Moves all extremities strongly against gravity.  There is some limitation of the LLE secondary to pain Sensory: Pinprick and light touch intact throughout, bilaterally Deep Tendon Reflexes: 2+ with absent AJ's bilaterally.   Plantars: Right: downgoing   Left: downgoing Cerebellar: normal finger-to-nose.  Unable to follow commands to complete heel to shin Gait: Unable to test CV: pulses palpable throughout   Laboratory Studies:   Basic Metabolic Panel:  Recent Labs Lab 11/29/12 0921 11/30/12 0028  NA 141 143  K 3.6 3.9  CL 104 103  CO2 22  --   GLUCOSE 116* 123*  BUN 16 16  CREATININE 0.85 1.30*  CALCIUM 10.7*  --     Liver Function Tests:  Recent Labs Lab 11/29/12 0921  AST 20  ALT 11  ALKPHOS 74  BILITOT 0.5  PROT 7.7  ALBUMIN 4.2   No results found for this basename: LIPASE, AMYLASE,  in the last 168 hours No results found for this basename: AMMONIA,  in the last 168 hours  CBC:  Recent  Labs Lab 11/29/12 0921 11/29/12 2307 11/30/12 0028  WBC 6.8 6.6  --   NEUTROABS 5.3 5.1  --   HGB 12.9 11.5* 12.2  HCT 38.9 33.4* 36.0  MCV 88.6 87.9  --   PLT 192 207  --     Cardiac Enzymes: No results found for this basename: CKTOTAL, CKMB, CKMBINDEX, TROPONINI,  in the last 168 hours  BNP: No components found with this basename: POCBNP,   CBG:  Recent Labs Lab 11/29/12 2356  GLUCAP 108*    Microbiology: Results for orders placed during the hospital encounter of 11/29/12  MRSA PCR SCREENING     Status: None   Collection Time    11/30/12  2:21 AM      Result Value Range Status   MRSA by PCR NEGATIVE  NEGATIVE Final   Comment:            The GeneXpert MRSA Assay (FDA     approved for NASAL specimens  only), is one component of a     comprehensive MRSA colonization     surveillance program. It is not     intended to diagnose MRSA     infection nor to guide or     monitor treatment for     MRSA infections.    Coagulation Studies: No results found for this basename: LABPROT, INR,  in the last 72 hours  Urinalysis:  Recent Labs Lab 11/29/12 0949 11/30/12 0110  COLORURINE COLORLESS* YELLOW  LABSPEC 1.007 1.020  PHURINE 7.0 6.5  GLUCOSEU NEGATIVE NEGATIVE  HGBUR NEGATIVE NEGATIVE  BILIRUBINUR NEGATIVE SMALL*  KETONESUR NEGATIVE NEGATIVE  PROTEINUR NEGATIVE 30*  UROBILINOGEN 0.2 1.0  NITRITE NEGATIVE NEGATIVE  LEUKOCYTESUR NEGATIVE NEGATIVE    Lipid Panel:     Component Value Date/Time   CHOL 169 12/01/2011 1528   TRIG 82 12/01/2011 1528   HDL 78 12/01/2011 1528   CHOLHDL 2.2 12/01/2011 1528   VLDL 16 12/01/2011 1528   LDLCALC 75 12/01/2011 1528    HgbA1C:  No results found for this basename: HGBA1C    Urine Drug Screen:     Component Value Date/Time   LABOPIA NONE DETECTED 07/23/2012 1317   COCAINSCRNUR NONE DETECTED 07/23/2012 1317   COCAINSCRNUR NEG 03/04/2008 1950   LABBENZ NONE DETECTED 07/23/2012 1317   LABBENZ NEG 03/04/2008 1950    AMPHETMU NONE DETECTED 07/23/2012 1317   AMPHETMU NEG 03/04/2008 1950   THCU NONE DETECTED 07/23/2012 1317   LABBARB NONE DETECTED 07/23/2012 1317    Alcohol Level: No results found for this basename: ETH,  in the last 168 hours  Other results: EKG: sinus rhythm at 70 bpm with RBBB  Imaging: Dg Chest 2 View  11/29/2012   CLINICAL DATA:  Altered mental status.  EXAM: CHEST  2 VIEW  COMPARISON:  05/28/2008 and thoracic spine CT examinations dated 07/23/2009 and 04/02/2009.  FINDINGS: Heart size near the upper limit of normal. Clear lungs. Mild scoliosis.  IMPRESSION: No acute abnormality.   Electronically Signed   By: Gordan Payment   On: 11/29/2012 23:46   Ct Head Wo Contrast  11/30/2012   *RADIOLOGY REPORT*  Clinical Data: Altered mental status  CT HEAD WITHOUT CONTRAST  Technique:  Contiguous axial images were obtained from the base of the skull through the vertex without contrast.  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  There is diffuse patchy low density throughout the subcortical and periventricular white matter consistent with chronic small vessel ischemic change.  There is no evidence for acute brain infarct, hemorrhage or mass.  The paranasal sinuses and mastoid air cells are clear.  The skull is intact.  There is a small left frontal hematoma.  IMPRESSION:  1.  No acute intracranial abnormalities.   Original Report Authenticated By: Signa Kell, M.D.   Ct Head Wo Contrast  11/29/2012   CLINICAL DATA:  Seizure with fall, laceration above left eye  EXAM: CT HEAD WITHOUT CONTRAST  CT CERVICAL SPINE WITHOUT CONTRAST  TECHNIQUE: Multidetector CT imaging of the head and cervical spine was performed following the standard protocol without intravenous contrast. Multiplanar CT image reconstructions of the cervical spine were also generated.  COMPARISON:  Head CT 05/11/2003, brain MRI 07/23/2012  FINDINGS: CT HEAD FINDINGS  Exam  degraded by patient head motion exam was repeated with some improvement. No acute intracranial hemorrhage. No focal mass lesion. No CT evidence of acute infarction. No midline shift or  mass effect. No hydrocephalus. Basilar cisterns are patent.  There is mild generalized cortical atrophy. There are periventricular and subcortical white matter hypodensities. There is a deep white matter infarction in the left external capsule unchanged from prior.  Paranasal sinuses and  mastoid air cells are clear.  CT CERVICAL SPINE FINDINGS  No prevertebral soft tissue swelling. There is straightening of the normal cervical lordosis. There is joint space narrowing and osteophytosis at C6-C7. Normal facet articulation. Normal craniocervical junction. No evidence of epidural or paraspinal hematoma  IMPRESSION: CT HEAD IMPRESSION  No intracranial trauma. Remote infarction left basal ganglia. Chronic atrophy and microvascular disease.  CT CERVICAL SPINE IMPRESSION  No cervical spine fracture. Mild disc osteophytic disease.  Straightening of the normal cervical lordosis may be secondary to position, muscle spasm, or ligamentous injury.   Electronically Signed   By: Genevive Bi M.D.   On: 11/29/2012 10:33   Ct Cervical Spine Wo Contrast  11/29/2012   CLINICAL DATA:  Seizure with fall, laceration above left eye  EXAM: CT HEAD WITHOUT CONTRAST  CT CERVICAL SPINE WITHOUT CONTRAST  TECHNIQUE: Multidetector CT imaging of the head and cervical spine was performed following the standard protocol without intravenous contrast. Multiplanar CT image reconstructions of the cervical spine were also generated.  COMPARISON:  Head CT 05/11/2003, brain MRI 07/23/2012  FINDINGS: CT HEAD FINDINGS  Exam degraded by patient head motion exam was repeated with some improvement. No acute intracranial hemorrhage. No focal mass lesion. No CT evidence of acute infarction. No midline shift or mass effect. No hydrocephalus. Basilar cisterns are patent.   There is mild generalized cortical atrophy. There are periventricular and subcortical white matter hypodensities. There is a deep white matter infarction in the left external capsule unchanged from prior.  Paranasal sinuses and  mastoid air cells are clear.  CT CERVICAL SPINE FINDINGS  No prevertebral soft tissue swelling. There is straightening of the normal cervical lordosis. There is joint space narrowing and osteophytosis at C6-C7. Normal facet articulation. Normal craniocervical junction. No evidence of epidural or paraspinal hematoma  IMPRESSION: CT HEAD IMPRESSION  No intracranial trauma. Remote infarction left basal ganglia. Chronic atrophy and microvascular disease.  CT CERVICAL SPINE IMPRESSION  No cervical spine fracture. Mild disc osteophytic disease.  Straightening of the normal cervical lordosis may be secondary to position, muscle spasm, or ligamentous injury.   Electronically Signed   By: Genevive Bi M.D.   On: 11/29/2012 10:33     Assessment/Plan: 67 year old female presenting earlier with seizure.  Patient now returns with altered mental status despite being loaded with Keppra.  Patient has improved since evaluation earlier in the evening.  May very well be at baseline at t his time.  Suspect earlier today she was experiencing a prolonged post-ictal period.  Does not appear to be in nonconvulsive status at this time.  CT reviewed and shows no acute changes.  Recommendations: 1.  Continue Keppra at current dose with change to po once medically cleared to take po.   2.  MRI of the brain later today 3.  Neuro checks 4.  Seizure precautions 5.  Head CT stat to rule out development of SDH, etc. 6.  Magnesium, phosphorus with AM labs  Thana Farr, MD [facility] [phone] 11/30/2012, 4:54 AM

## 2012-11-30 NOTE — Clinical Social Work Psychosocial (Signed)
Clinical Social Work Department BRIEF PSYCHOSOCIAL ASSESSMENT 11/30/2012  Patient:  [name], [name]     Account Number:  [id number]     Admit date:  11/29/2012  Clinical Social Worker:  Sherre Lain  Date/Time:  11/30/2012 11:38 AM  Referred by:  Physician  Date Referred:  11/30/2012 Referred for  SNF Placement   Other Referral:   none.   Interview type:  Patient Other interview type:   Pt's family (sister, daughter, grandson) presented at bedside at the end of CSW assessment.    PSYCHOSOCIAL DATA Living Status:  FACILITY Admitted from facility:  [facility] Level of care:  Skilled Nursing Facility Primary support name:  May (pt's Niece) Primary support relationship to patient:  FAMILY Degree of support available:   Strong.    CURRENT CONCERNS Current Concerns  Post-Acute Placement   Other Concerns:   none.    SOCIAL WORK ASSESSMENT / PLAN CSW met with pt at bedside. Pt stated that she was previously at [facility] prior to admission to [facility]. Pt stated that she does not want to return to [facility] once medically discharged from [facility], and would like CSW to present pt with other bed offers in [facility]. Pt gave CSW permission to speak with pt's family once they re-entered the room. Pt's sister stated that she does not know why pt would not want to return to [facility], and asked if she could call CSW at a later point in the day. CSW will continue to assist with discharge planning needs and provide support to both pt and pt's family.   Assessment/plan status:  Psychosocial Support/Ongoing Assessment of Needs Other assessment/ plan:   none.   Information/referral to community resources:   [facility] placement.    PATIENTS/FAMILYS RESPONSE TO PLAN OF CARE: Pt was understanding and agreeable to CSW plan of care.       Darlyn Chamber, MSW, LCSWA Clinical Social Work [phone]

## 2012-11-30 NOTE — Progress Notes (Signed)
  Patient has been followed by internal medicine  D/w teaching service who kindly agreed to take the patient under their service; agreed d/w resident and attending Dr. Dirk Dress, [place] Hankin N 11/30/12 9:53 AM

## 2012-11-30 NOTE — H&P (Signed)
[facility] History and Physical  CHARLYNN SALIH ZOX:[id number] DOB: 1945-12-25 DOA: 11/29/2012  Referring physician: ER physician. PCP: Josph Macho, MD   Chief Complaint: Lethargic and difficulty to speak.  HPI: [name] is a 67 y.o. female who was brought in yesterday after patient had a seizure at the nursing home. This was witnessed by patient's roommate as per the chart. Patient had a CT head which did not show anything acute. Patient was loaded with Keppra as per the neurologist recommendation and discharged back to nursing home on oral Keppra. On patient arrival at the nursing home was found to be lethargic and nonverbal so patient was sent back to the ER. On arrival patient was moving all extremities but found to be lethargic. On-call neurologist was consulted by the ER physician and was advised to get a repeat CT head. Repeat CT head did not show anything acute and at this time patient has been admitted for further management. On my exam patient presently is alert awake and oriented to time place and person and moves all extremities and follows commands. But the nurse states that patient has periods of confusion where patient is not able to clearly say where she is. Patient otherwise denies any chest pain or shortness of breath. Patient has a laceration on the left for head which has been sutured yesterday. Patient's past medical history significant for metastatic carcinoid tumor.  Review of Systems: As presented in the history of presenting illness, rest negative.  Past Medical History  Diagnosis Date  . 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 Myna Hidalgo w/sandostatin injections q monthly  . Anemia     iron deficiency  . Depression   . Hyperlipidemia   . Hypertension   . History of cocaine abuse     quit 03/06; seizure and HTN urgency secondary to use 12/04  . Seizures   . 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 Weight loss     15 lbs 08/07 (regained 104 08/07 and 112 lbs 12/07)  . Arthritis   . Halitosis     per notes  . Herpes zoster of eye 04/09    right eye  . Chronic kidney disease, stage III (moderate)     BL Cr 1.2-1.3   Past Surgical History  Procedure Laterality Date  . Hip arthroplasty Right 07/24/2012    Procedure: ARTHROPLASTY BIPOLAR HIP;  Surgeon: Verlee Rossetti, MD;  Location: [facility] OR;  Service: Orthopedics;  Laterality: Right;   Social History:  reports that she has never smoked. She does not have any smokeless tobacco history on file. She reports that she does not drink alcohol or use illicit drugs. Nursing home. where does patient live-- Not sure. Can patient participate in ADLs?  No Known Allergies  Family History  Problem Relation Age of Onset  . Cancer Mother   . Cancer Father   . Diabetes Brother       Prior to Admission medications   Medication Sig Start Date End Date Taking? Authorizing Provider  amLODipine (NORVASC) 10 MG tablet Take 1 tablet (10 mg total) by mouth daily. 02/27/12  Yes Maitri S Kalia-Reynolds, DO  cloNIDine (CATAPRES) 0.2 MG tablet Take 1 tablet (0.2 mg total)  by mouth 3 (three) times daily. 01/18/12  Yes Maitri S Kalia-Reynolds, DO  levETIRAcetam (KEPPRA) 500 MG tablet Take 1 tablet (500 mg total) by mouth 2 (two) times daily. 11/29/12  Yes Gwyneth Sprout, MD  lisinopril (PRINIVIL,ZESTRIL) 20 MG tablet Take 1 tablet (20 mg total) by mouth daily. 05/09/12  Yes Maitri S Kalia-Reynolds, DO  Multiple Vitamins-Iron (MULTIVITAMIN/IRON) TABS Take 1 tablet by mouth daily.    Yes Historical Provider, MD   Physical Exam: Filed Vitals:   11/30/12 0027 11/30/12 0222  BP: 151/61 168/50  Pulse: 73 65  Temp: 98.5 F (36.9 C) 98.2 F  (36.8 C)  TempSrc: Oral Oral  Resp:  20  SpO2: 100% 97%     General:  Well developed and moderately nourished.  Eyes: Anicteric no pallor.  ENT: Left periorbital suturing done for laceration.  Neck: No mass felt and no neck rigidity.  Cardiovascular: S1-S2 heard.  Respiratory: No rhonchi or crepitations.  Abdomen: Soft nontender bowel sounds present.  Skin: No rash.  Musculoskeletal: No edema.  Psychiatric: Patient is sleeping but arousable and follows commands.  Neurologic: Moves all extremities.  Labs on Admission:  Basic Metabolic Panel:  Recent Labs Lab 11/29/12 0921 11/30/12 0028  NA 141 143  K 3.6 3.9  CL 104 103  CO2 22  --   GLUCOSE 116* 123*  BUN 16 16  CREATININE 0.85 1.30*  CALCIUM 10.7*  --    Liver Function Tests:  Recent Labs Lab 11/29/12 0921  AST 20  ALT 11  ALKPHOS 74  BILITOT 0.5  PROT 7.7  ALBUMIN 4.2   No results found for this basename: LIPASE, AMYLASE,  in the last 168 hours No results found for this basename: AMMONIA,  in the last 168 hours CBC:  Recent Labs Lab 11/29/12 0921 11/29/12 2307 11/30/12 0028  WBC 6.8 6.6  --   NEUTROABS 5.3 5.1  --   HGB 12.9 11.5* 12.2  HCT 38.9 33.4* 36.0  MCV 88.6 87.9  --   PLT 192 207  --    Cardiac Enzymes: No results found for this basename: CKTOTAL, CKMB, CKMBINDEX, TROPONINI,  in the last 168 hours  BNP (last 3 results) No results found for this basename: PROBNP,  in the last 8760 hours CBG:  Recent Labs Lab 11/29/12 2356  GLUCAP 108*    Radiological Exams on Admission: Dg Chest 2 View  11/29/2012   CLINICAL DATA:  Altered mental status.  EXAM: CHEST  2 VIEW  COMPARISON:  05/28/2008 and thoracic spine CT examinations dated 07/23/2009 and 04/02/2009.  FINDINGS: Heart size near the upper limit of normal. Clear lungs. Mild scoliosis.  IMPRESSION: No acute abnormality.   Electronically Signed   By: Gordan Payment   On: 11/29/2012 23:46   Ct Head Wo Contrast  11/30/2012    *RADIOLOGY REPORT*  Clinical Data: Altered mental status  CT HEAD WITHOUT CONTRAST  Technique:  Contiguous axial images were obtained from the base of the skull through the vertex without contrast.  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  There is diffuse patchy low density throughout the subcortical and periventricular white matter consistent with chronic small vessel ischemic change.  There is no evidence for acute brain infarct, hemorrhage or mass.  The paranasal sinuses and mastoid air cells are clear.  The skull is intact.  There is a small left frontal hematoma.  IMPRESSION:  1.  No acute intracranial abnormalities.  Original Report Authenticated By: Signa Kell, M.D.   Ct Head Wo Contrast  11/29/2012   CLINICAL DATA:  Seizure with fall, laceration above left eye  EXAM: CT HEAD WITHOUT CONTRAST  CT CERVICAL SPINE WITHOUT CONTRAST  TECHNIQUE: Multidetector CT imaging of the head and cervical spine was performed following the standard protocol without intravenous contrast. Multiplanar CT image reconstructions of the cervical spine were also generated.  COMPARISON:  Head CT 05/11/2003, brain MRI 07/23/2012  FINDINGS: CT HEAD FINDINGS  Exam degraded by patient head motion exam was repeated with some improvement. No acute intracranial hemorrhage. No focal mass lesion. No CT evidence of acute infarction. No midline shift or mass effect. No hydrocephalus. Basilar cisterns are patent.  There is mild generalized cortical atrophy. There are periventricular and subcortical white matter hypodensities. There is a deep white matter infarction in the left external capsule unchanged from prior.  Paranasal sinuses and  mastoid air cells are clear.  CT CERVICAL SPINE FINDINGS  No prevertebral soft tissue swelling. There is straightening of the normal cervical lordosis. There is joint space narrowing and osteophytosis at C6-C7. Normal facet  articulation. Normal craniocervical junction. No evidence of epidural or paraspinal hematoma  IMPRESSION: CT HEAD IMPRESSION  No intracranial trauma. Remote infarction left basal ganglia. Chronic atrophy and microvascular disease.  CT CERVICAL SPINE IMPRESSION  No cervical spine fracture. Mild disc osteophytic disease.  Straightening of the normal cervical lordosis may be secondary to position, muscle spasm, or ligamentous injury.   Electronically Signed   By: Genevive Bi M.D.   On: 11/29/2012 10:33   Ct Cervical Spine Wo Contrast  11/29/2012   CLINICAL DATA:  Seizure with fall, laceration above left eye  EXAM: CT HEAD WITHOUT CONTRAST  CT CERVICAL SPINE WITHOUT CONTRAST  TECHNIQUE: Multidetector CT imaging of the head and cervical spine was performed following the standard protocol without intravenous contrast. Multiplanar CT image reconstructions of the cervical spine were also generated.  COMPARISON:  Head CT 05/11/2003, brain MRI 07/23/2012  FINDINGS: CT HEAD FINDINGS  Exam degraded by patient head motion exam was repeated with some improvement. No acute intracranial hemorrhage. No focal mass lesion. No CT evidence of acute infarction. No midline shift or mass effect. No hydrocephalus. Basilar cisterns are patent.  There is mild generalized cortical atrophy. There are periventricular and subcortical white matter hypodensities. There is a deep white matter infarction in the left external capsule unchanged from prior.  Paranasal sinuses and  mastoid air cells are clear.  CT CERVICAL SPINE FINDINGS  No prevertebral soft tissue swelling. There is straightening of the normal cervical lordosis. There is joint space narrowing and osteophytosis at C6-C7. Normal facet articulation. Normal craniocervical junction. No evidence of epidural or paraspinal hematoma  IMPRESSION: CT HEAD IMPRESSION  No intracranial trauma. Remote infarction left basal ganglia. Chronic atrophy and microvascular disease.  CT CERVICAL SPINE  IMPRESSION  No cervical spine fracture. Mild disc osteophytic disease.  Straightening of the normal cervical lordosis may be secondary to position, muscle spasm, or ligamentous injury.   Electronically Signed   By: Genevive Bi M.D.   On: 11/29/2012 10:33     Assessment/Plan Principal Problem:   Acute encephalopathy Active Problems:   CARCINOID TUMOR   HYPERTENSION   Chronic kidney disease, stage III (moderate)   Seizure   1. Acute encephalopathy - most likely post ictal and also secondary to Keppra. Given the history of metastatic carcinoid we will check MRI brain(cannot give contrast due to chronic kidney disease).  Check EEG. Continue IV Keppra until patient can reliably take Keppra orally. I have consulted neurologist. 2. Metastatic carcinoid - per oncologist. 3. Chronic kidney disease - closely follow metabolic panel and intake output. Note that patient is on lisinopril. 4. Hypertension - continue present medications.    Code Status: Full code.  Family Communication: None.  Disposition Plan: Admit to inpatient.    Mazelle Huebert N. [facility] Pager [phone].  If 7PM-7AM, please contact night-coverage [email or web address] Password [facility] 11/30/2012, 3:28 AM

## 2012-11-30 NOTE — Evaluation (Signed)
Clinical/Bedside Swallow Evaluation Patient Details  Name: BEZA STEPPE MRN: [id number] Date of Birth: Nov 08, 1945  Today's Date: 11/30/2012 Time: 1206-1224 SLP Time Calculation (min): 18 min  Past Medical History:  Past Medical History  Diagnosis Date  . 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 Myna Hidalgo w/sandostatin injections q monthly  . Anemia     iron deficiency  . Depression   . Hyperlipidemia   . Hypertension   . History of cocaine abuse     quit 03/06; seizure and HTN urgency secondary to use 12/04  . Seizures   . 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 Weight loss     15 lbs 08/07 (regained 104 08/07 and 112 lbs 12/07)  . Arthritis   . Halitosis     per notes  . Herpes zoster of eye 04/09    right eye  . Chronic kidney disease, stage III (moderate)     BL Cr 1.2-1.3   Past Surgical History:  Past Surgical History  Procedure Laterality Date  . Hip arthroplasty Right 07/24/2012    Procedure: ARTHROPLASTY BIPOLAR HIP;  Surgeon: Verlee Rossetti, MD;  Location: [facility] OR;  Service: Orthopedics;  Laterality: Right;   HPI:  MALITA IGNASIAK is a 67 y.o. female who was brought in yesterday after patient had a seizure at the nursing home. This was witnessed by patient's roommate as per the chart. Patient had a CT head which did not show anything acute. Patient was loaded with Keppra as per the neurologist recommendation and discharged back to nursing home on oral Keppra. On patient arrival at the nursing home was found to be lethargic and nonverbal so patient was sent back to the ER. On arrival patient was moving all extremities but found to be lethargic. On-call neurologist was consulted by the ER physician  and was advised to get a repeat CT head. Repeat CT head did not show anything acute and at this time patient has been admitted for further management. On my exam patient presently is alert awake and oriented to time place and person and moves all extremities and follows commands. But the nurse states that patient has periods of confusion where patient is not able to clearly say where she is. Patient otherwise denies any chest pain or shortness of breath. Patient has a laceration on the left for head which has been sutured yesterday. Patient's past medical history significant for metastatic carcinoid tumor.   Assessment / Plan / Recommendation Clinical Impression  Pt demonstrates swallow function WNL. Pt does have missing dentition making mastication somewhat difficult but functional at baseline. Pt recommended to consume a dys 3 diet with thin liquids. No SLP f/u needed. Will defer to RN to get order from MD as there is no number available in the chart.     Aspiration Risk  Mild    Diet Recommendation Dysphagia 3 (Mechanical Soft);Thin liquid   Liquid Administration via: Cup;Straw Medication Administration: Whole meds with liquid Supervision: Patient able to self feed Postural Changes and/or Swallow Maneuvers: Seated upright 90 degrees    Other  Recommendations Oral Care Recommendations: Oral care BID   Follow Up Recommendations  None    Frequency and Duration  Pertinent Vitals/Pain NA    SLP Swallow Goals     Swallow Study Prior Functional Status       General HPI: TREVA HUYETT is a 67 y.o. female who was brought in yesterday after patient had a seizure at the nursing home. This was witnessed by patient's roommate as per the chart. Patient had a CT head which did not show anything acute. Patient was loaded with Keppra as per the neurologist recommendation and discharged back to nursing home on oral Keppra. On patient arrival at the nursing home was found to be lethargic  and nonverbal so patient was sent back to the ER. On arrival patient was moving all extremities but found to be lethargic. On-call neurologist was consulted by the ER physician and was advised to get a repeat CT head. Repeat CT head did not show anything acute and at this time patient has been admitted for further management. On my exam patient presently is alert awake and oriented to time place and person and moves all extremities and follows commands. But the nurse states that patient has periods of confusion where patient is not able to clearly say where she is. Patient otherwise denies any chest pain or shortness of breath. Patient has a laceration on the left for head which has been sutured yesterday. Patient's past medical history significant for metastatic carcinoid tumor. Type of Study: Bedside swallow evaluation Diet Prior to this Study: NPO Temperature Spikes Noted: No Respiratory Status: Room air History of Recent Intubation: No Behavior/Cognition: Alert;Cooperative;Pleasant mood;Confused Oral Cavity - Dentition: Missing dentition Self-Feeding Abilities: Able to feed self Patient Positioning: Upright in bed Baseline Vocal Quality: Clear Volitional Cough: Strong Volitional Swallow: Able to elicit    Oral/Motor/Sensory Function Overall Oral Motor/Sensory Function: Appears within functional limits for tasks assessed (is missing teeth asymmetrically, makes mouth look crooked)   Ice Chips     Thin Liquid Thin Liquid: Within functional limits Presentation: Cup;Straw;Self Fed    Nectar Thick Nectar Thick Liquid: Not tested   Honey Thick Honey Thick Liquid: Not tested   Puree Puree: Within functional limits   Solid   GO    Solid: Impaired Presentation: Self Fed Oral Phase Functional Implications: Other (comment) (see impression statement)       Delores Thelen, Riley Nearing 11/30/2012,1:04 PM

## 2012-11-30 NOTE — Progress Notes (Addendum)
Transfer Note: IMTS Service was asked to assume care from Hospitalist Service Pt admitted from SNF with altered mental status after reported seizure, Neurology consulted, CT Scan negative for acute stroke, initially discharged back to SNF but represented after continued AMS  Subjective: Pt resting comfortably with sister, daughter, and grandson at bedside.  Pt with some degree of expressive aphasia  Objective: Vital signs in last 24 hours: Filed Vitals:   11/30/12 0027 11/30/12 0222 11/30/12 0541 11/30/12 1000  BP: 151/61 168/50 149/58 171/63  Pulse: 73 65 70 73  Temp: 98.5 F (36.9 C) 98.2 F (36.8 C) 99.2 F (37.3 C) 99 F (37.2 C)  TempSrc: Oral Oral Oral Oral  Resp:  20 20 20   SpO2: 100% 97% 100% 100%   Weight change:   Intake/Output Summary (Last 24 hours) at 11/30/12 1241 Last data filed at 11/30/12 0900  Gross per 24 hour  Intake      0 ml  Output     20 ml  Net    -20 ml   Physical Exam: General: Elderly, thin AA female, in no acute distress; lying flat in bed Head: Normocephalic, bandage to left forehead, PERRLA, EOMI, Moist mucous membranes, no purulence, Neck supple Lungs: Shallow respiratory effort. Clear to auscultation bilaterally from apices to bases without crackles or wheezes appreciated anterior fields Heart: tachycardic rate, regular rhythm, normal S1 and S2, no gallop, murmur, or rubs appreciated. Abdomen: BS normoactive. Soft, Nondistended, non-tender. No masses or organomegaly appreciated. Extremities: No pretibial edema, distal pulses intact, Right SCD in place, Left SCD not in place Neurologic: moves all extremities spontaneously, alert and oriented to self and sister only, repeats birth date query on current year, President, day, etc; unable to cooperate throughout most of examination, DTR 2+ patellar and brachial bilaterally    Lab Results: Basic Metabolic Panel:  Recent Labs Lab 11/29/12 0921 11/30/12 0028 11/30/12 0530 11/30/12 0535    NA 141 143  --  140  K 3.6 3.9  --  3.8  CL 104 103  --  102  CO2 22  --   --  25  GLUCOSE 116* 123*  --  94  BUN 16 16  --  18  CREATININE 0.85 1.30*  --  1.02  CALCIUM 10.7*  --   --  10.4  MG  --   --  1.8  --   PHOS  --   --  4.0  --    Liver Function Tests:  Recent Labs Lab 11/29/12 0921 11/30/12 0535  AST 20 24  ALT 11 12  ALKPHOS 74 67  BILITOT 0.5 1.1  PROT 7.7 7.2  ALBUMIN 4.2 3.7   No results found for this basename: LIPASE, AMYLASE,  in the last 168 hours  Recent Labs Lab 11/30/12 0535  AMMONIA 41   CBC:  Recent Labs Lab 11/29/12 2307 11/30/12 0028 11/30/12 0535  WBC 6.6  --  6.0  NEUTROABS 5.1  --  3.9  HGB 11.5* 12.2 11.3*  HCT 33.4* 36.0 34.1*  MCV 87.9  --  88.3  PLT 207  --  209   CBG:  Recent Labs Lab 11/29/12 2356  GLUCAP 108*   Hemoglobin A1C:  Recent Labs Lab 11/30/12 0535  HGBA1C 6.2*   Fasting Lipid Panel:  Recent Labs Lab 11/30/12 0535  CHOL 175  HDL 82  LDLCALC 81  TRIG 58  CHOLHDL 2.1   Thyroid Function Tests:  Recent Labs Lab 11/30/12 0535  TSH  1.554   Urine Drug Screen: Drugs of Abuse     Component Value Date/Time   LABOPIA NONE DETECTED 11/30/2012 0110   COCAINSCRNUR NONE DETECTED 11/30/2012 0110   COCAINSCRNUR NEG 03/04/2008 1950   LABBENZ NONE DETECTED 11/30/2012 0110   LABBENZ NEG 03/04/2008 1950   AMPHETMU NONE DETECTED 11/30/2012 0110   AMPHETMU NEG 03/04/2008 1950   THCU NONE DETECTED 11/30/2012 0110   LABBARB NONE DETECTED 11/30/2012 0110    Alcohol Level: No results found for this basename: ETH,  in the last 168 hours Urinalysis:  Recent Labs Lab 11/29/12 0949 11/30/12 0110  COLORURINE COLORLESS* YELLOW  LABSPEC 1.007 1.020  PHURINE 7.0 6.5  GLUCOSEU NEGATIVE NEGATIVE  HGBUR NEGATIVE NEGATIVE  BILIRUBINUR NEGATIVE SMALL*  KETONESUR NEGATIVE NEGATIVE  PROTEINUR NEGATIVE 30*  UROBILINOGEN 0.2 1.0  NITRITE NEGATIVE NEGATIVE  LEUKOCYTESUR NEGATIVE NEGATIVE    Micro  Results: Recent Results (from the past 240 hour(s))  MRSA PCR SCREENING     Status: None   Collection Time    11/30/12  2:21 AM      Result Value Range Status   MRSA by PCR NEGATIVE  NEGATIVE Final   Comment:            The GeneXpert MRSA Assay (FDA     approved for NASAL specimens     only), is one component of a     comprehensive MRSA colonization     surveillance program. It is not     intended to diagnose MRSA     infection nor to guide or     monitor treatment for     MRSA infections.   Studies/Results: Dg Chest 2 View  11/29/2012   CLINICAL DATA:  Altered mental status.  EXAM: CHEST  2 VIEW  COMPARISON:  05/28/2008 and thoracic spine CT examinations dated 07/23/2009 and 04/02/2009.  FINDINGS: Heart size near the upper limit of normal. Clear lungs. Mild scoliosis.  IMPRESSION: No acute abnormality.   Electronically Signed   By: Gordan Payment   On: 11/29/2012 23:46   Ct Head Wo Contrast  11/30/2012   *RADIOLOGY REPORT*  Clinical Data: Altered mental status  CT HEAD WITHOUT CONTRAST  Technique:  Contiguous axial images were obtained from the base of the skull through the vertex without contrast.  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  There is diffuse patchy low density throughout the subcortical and periventricular white matter consistent with chronic small vessel ischemic change.  There is no evidence for acute brain infarct, hemorrhage or mass.  The paranasal sinuses and mastoid air cells are clear.  The skull is intact.  There is a small left frontal hematoma.  IMPRESSION:  1.  No acute intracranial abnormalities.   Original Report Authenticated By: Signa Kell, M.D.   Ct Head Wo Contrast  11/29/2012   CLINICAL DATA:  Seizure with fall, laceration above left eye  EXAM: CT HEAD WITHOUT CONTRAST  CT CERVICAL SPINE WITHOUT CONTRAST  TECHNIQUE: Multidetector CT imaging of the head and cervical spine was  performed following the standard protocol without intravenous contrast. Multiplanar CT image reconstructions of the cervical spine were also generated.  COMPARISON:  Head CT 05/11/2003, brain MRI 07/23/2012  FINDINGS: CT HEAD FINDINGS  Exam degraded by patient head motion exam was repeated with some improvement. No acute intracranial hemorrhage. No focal mass lesion. No CT evidence of acute infarction. No midline shift or mass effect. No hydrocephalus. Basilar  cisterns are patent.  There is mild generalized cortical atrophy. There are periventricular and subcortical white matter hypodensities. There is a deep white matter infarction in the left external capsule unchanged from prior.  Paranasal sinuses and  mastoid air cells are clear.  CT CERVICAL SPINE FINDINGS  No prevertebral soft tissue swelling. There is straightening of the normal cervical lordosis. There is joint space narrowing and osteophytosis at C6-C7. Normal facet articulation. Normal craniocervical junction. No evidence of epidural or paraspinal hematoma  IMPRESSION: CT HEAD IMPRESSION  No intracranial trauma. Remote infarction left basal ganglia. Chronic atrophy and microvascular disease.  CT CERVICAL SPINE IMPRESSION  No cervical spine fracture. Mild disc osteophytic disease.  Straightening of the normal cervical lordosis may be secondary to position, muscle spasm, or ligamentous injury.   Electronically Signed   By: Genevive Bi M.D.   On: 11/29/2012 10:33   Ct Cervical Spine Wo Contrast  11/29/2012   CLINICAL DATA:  Seizure with fall, laceration above left eye  EXAM: CT HEAD WITHOUT CONTRAST  CT CERVICAL SPINE WITHOUT CONTRAST  TECHNIQUE: Multidetector CT imaging of the head and cervical spine was performed following the standard protocol without intravenous contrast. Multiplanar CT image reconstructions of the cervical spine were also generated.  COMPARISON:  Head CT 05/11/2003, brain MRI 07/23/2012  FINDINGS: CT HEAD FINDINGS  Exam degraded  by patient head motion exam was repeated with some improvement. No acute intracranial hemorrhage. No focal mass lesion. No CT evidence of acute infarction. No midline shift or mass effect. No hydrocephalus. Basilar cisterns are patent.  There is mild generalized cortical atrophy. There are periventricular and subcortical white matter hypodensities. There is a deep white matter infarction in the left external capsule unchanged from prior.  Paranasal sinuses and  mastoid air cells are clear.  CT CERVICAL SPINE FINDINGS  No prevertebral soft tissue swelling. There is straightening of the normal cervical lordosis. There is joint space narrowing and osteophytosis at C6-C7. Normal facet articulation. Normal craniocervical junction. No evidence of epidural or paraspinal hematoma  IMPRESSION: CT HEAD IMPRESSION  No intracranial trauma. Remote infarction left basal ganglia. Chronic atrophy and microvascular disease.  CT CERVICAL SPINE IMPRESSION  No cervical spine fracture. Mild disc osteophytic disease.  Straightening of the normal cervical lordosis may be secondary to position, muscle spasm, or ligamentous injury.   Electronically Signed   By: Genevive Bi M.D.   On: 11/29/2012 10:33   Medications: I have reviewed the patient's current medications. Scheduled Meds: . amLODipine  10 mg Oral Daily  . cloNIDine  0.2 mg Oral TID  . levETIRAcetam  500 mg Intravenous Q12H  . lisinopril  20 mg Oral Daily   Continuous Infusions: . sodium chloride 50 mL/hr at 11/30/12 0446   PRN Meds:.acetaminophen  Assessment/Plan:   Acute encephalopathy: unclear etiology, CT negative, MRI pending   CARCINOID TUMOR: MRI to assess possible mets from Carcinoid, management per Dr. Drue Dun   HYPERTENSION: permissive htn acutely, will resume home regimen of amlodipine, clonidine, and lisinopril   Chronic kidney disease, stage III (moderate): at baseline creatine of (0.85-1)   Seizure: cont anti-convulsants per Neurology  recommendation, currently on IV Keppra, EEG pending   Dispo: Will continue care as documented on Hospitalist admission and Neurology consultation, including awaiting MRI of brain.I spoke with patient's family who are considering DNR status as patient would not want to be "on machines" and was very afraid of needles, hospitals, and breathing machines.   Disposition is deferred at this time, awaiting  improvement of current medical problems.  Anticipated discharge in approximately 3-4 day(s).   The patient does have a current PCP Josph Macho, MD) and does need an [facility] hospital follow-up appointment after discharge.  The patient does have transportation limitations that hinder transportation to clinic appointments.  .Services Needed at time of discharge: Y = Yes, Blank = No PT:   OT:   RN:   Equipment:   Other:     LOS: 1 day   Manuela Schwartz, MD 11/30/2012, 12:41 PM

## 2012-12-01 ENCOUNTER — Inpatient Hospital Stay (HOSPITAL_COMMUNITY): Payer: Medicare HMO

## 2012-12-01 DIAGNOSIS — I10 Essential (primary) hypertension: Secondary | ICD-10-CM

## 2012-12-01 DIAGNOSIS — G9340 Encephalopathy, unspecified: Secondary | ICD-10-CM

## 2012-12-01 DIAGNOSIS — R569 Unspecified convulsions: Secondary | ICD-10-CM

## 2012-12-01 LAB — GLUCOSE, CAPILLARY
Glucose-Capillary: 98 mg/dL (ref 70–99)
Glucose-Capillary: 85 mg/dL (ref 70–99)

## 2012-12-01 MED ORDER — HYDRALAZINE HCL 20 MG/ML IJ SOLN
5.0000 mg | INTRAMUSCULAR | Status: DC | PRN
  Administered 2012-12-02 (×2): 5 mg via INTRAVENOUS
  Filled 2012-12-01 (×3): qty 1

## 2012-12-01 MED ORDER — SODIUM CHLORIDE 0.9 % IV SOLN
1000.0000 mg | Freq: Two times a day (BID) | INTRAVENOUS | Status: DC
  Administered 2012-12-01 – 2012-12-02 (×4): 1000 mg via INTRAVENOUS
  Filled 2012-12-01 (×6): qty 10

## 2012-12-01 MED ORDER — LEVETIRACETAM 500 MG PO TABS: 500.0000 mg | ORAL_TABLET | Freq: Two times a day (BID) | ORAL | Status: DC

## 2012-12-01 MED ORDER — LORAZEPAM 2 MG/ML IJ SOLN
1.0000 mg | INTRAMUSCULAR | Status: DC | PRN
  Administered 2012-12-01 (×3): 1 mg via INTRAVENOUS
  Filled 2012-12-01 (×2): qty 1

## 2012-12-01 MED ORDER — SODIUM CHLORIDE 0.9 % IV SOLN
500.0000 mg | Freq: Two times a day (BID) | INTRAVENOUS | Status: DC
  Filled 2012-12-01 (×3): qty 5

## 2012-12-01 MED ORDER — LORAZEPAM 2 MG/ML IJ SOLN: 1.0000 mg | Freq: Once | INTRAMUSCULAR | Status: DC

## 2012-12-01 NOTE — Progress Notes (Signed)
NEURO HOSPITALIST PROGRESS NOTE   SUBJECTIVE:                                                                                                                        [name] is alert and awake and conversant. No further seizures noted. EEG showed no evidence of electrographic seizures but diffuse slowing consistent with encephalopathy or post ictal slowing. MRI brain attempted yesterday but patient uncooperative and will be retry today. On keppra 500 mg IV BID.  OBJECTIVE:                                                                                                                           Vital signs in last 24 hours: Temp:  [97.5 F (36.4 C)-99.7 F (37.6 C)] 97.5 F (36.4 C) (09/13 0211) Pulse Rate:  [57-83] 57 (09/13 0635) Resp:  [18-22] 20 (09/13 0635) BP: (134-175)/(49-91) 144/49 mmHg (09/13 0635) SpO2:  [95 %-100 %] 100 % (09/13 0635)  Intake/Output from previous day: 09/12 0701 - 09/13 0700 In: 360 [P.O.:360] Out: -  Intake/Output this shift:   Nutritional status: Dysphagia  Past Medical History  Diagnosis Date  . 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 Myna Hidalgo w/sandostatin injections q monthly  . Anemia     iron deficiency  . Depression   . Hyperlipidemia   . Hypertension   . History of cocaine abuse     quit 03/06; seizure and HTN urgency secondary to use 12/04  . Seizures   . 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 Weight loss     15 lbs 08/07 (regained 104 08/07 and 112 lbs 12/07)  . Arthritis   . Halitosis     per notes  . Herpes zoster of eye 04/09    right eye  . Chronic kidney disease, stage III (moderate)     BL Cr 1.2-1.3     Neurologic Exam:  Mental Status:  Alert and awake, oriented only to  place and person. Comprehension, naming, and repetition intact. Mild dysarthria. Follows 3rd order commands without difficulty.  Cranial Nerves:  II: Discs flat bilaterally; Visual fields grossly normal, pupils equal, round, reactive to light and accommodation  III,IV, VI: ptosis not present, extra-ocular motions intact bilaterally  V,VII: decreased left NLF, facial light touch sensation normal bilaterally  VIII: hearing normal bilaterally  IX,X: gag reflex present  XI: bilateral shoulder shrug  XII: midline tongue extension  Motor:  Moves all extremities strongly against gravity. There is some limitation of the LLE secondary to pain  Sensory: Pinprick and light touch intact throughout, bilaterally  Deep Tendon Reflexes: 2+ with absent AJ's bilaterally.  Plantars:  Right: downgoing Left: downgoing  Cerebellar:  normal finger-to-nose. Unable to follow commands to complete heel to shin  Gait: Unable to test  CV: pulses palpable throughout    Lab Results: Lab Results  Component Value Date/Time   CHOL 175 11/30/2012  5:35 AM   Lipid Panel  Recent Labs  11/30/12 0535  CHOL 175  TRIG 58  HDL 82  CHOLHDL 2.1  VLDL 12  LDLCALC 81    Studies/Results: Dg Chest 2 View  11/29/2012   CLINICAL DATA:  Altered mental status.  EXAM: CHEST  2 VIEW  COMPARISON:  05/28/2008 and thoracic spine CT examinations dated 07/23/2009 and 04/02/2009.  FINDINGS: Heart size near the upper limit of normal. Clear lungs. Mild scoliosis.  IMPRESSION: No acute abnormality.   Electronically Signed   By: Gordan Payment   On: 11/29/2012 23:46   Dg Knee 1-2 Views Right  11/30/2012   CLINICAL DATA:  Knee pain, fall.  EXAM: RIGHT KNEE - 1-2 VIEW  COMPARISON:  07/23/2012  FINDINGS: There is a moderate joint effusion. Irregularity noted at the superior pole of the patella, question fracture. No additional acute bony abnormality. No subluxation or  dislocation.  IMPRESSION: Irregularity along the superior pole of the patella. Cannot exclude fracture. Recommend clinical correlation for pain in this area. Moderate joint effusion.   Electronically Signed   By: Charlett Nose M.D.   On: 11/30/2012 22:24   Ct Head Wo Contrast  11/30/2012   *RADIOLOGY REPORT*  Clinical Data: Altered mental status  CT HEAD WITHOUT CONTRAST  Technique:  Contiguous axial images were obtained from the base of the skull through the vertex without contrast.  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  There is diffuse patchy low density throughout the subcortical and periventricular white matter consistent with chronic small vessel ischemic change.  There is no evidence for acute brain infarct, hemorrhage or mass.  The paranasal sinuses and mastoid air cells are clear.  The skull is intact.  There is a small left frontal hematoma.  IMPRESSION:  1.  No acute intracranial abnormalities.   Original Report Authenticated By: Signa Kell, M.D.   Ct Head Wo Contrast  11/29/2012   CLINICAL DATA:  Seizure with fall, laceration above left eye  EXAM: CT HEAD WITHOUT CONTRAST  CT CERVICAL SPINE WITHOUT CONTRAST  TECHNIQUE: Multidetector CT imaging of the head and cervical spine was performed following the standard protocol without intravenous contrast. Multiplanar CT image reconstructions of the cervical spine were also generated.  COMPARISON:  Head CT 05/11/2003, brain MRI 07/23/2012  FINDINGS: CT HEAD FINDINGS  Exam degraded by patient head motion exam was repeated with some improvement. No acute intracranial hemorrhage. No focal mass lesion. No CT evidence of acute infarction. No midline  shift or mass effect. No hydrocephalus. Basilar cisterns are patent.  There is mild generalized cortical atrophy. There are periventricular and subcortical white matter hypodensities. There is a deep white matter infarction in the  left external capsule unchanged from prior.  Paranasal sinuses and  mastoid air cells are clear.  CT CERVICAL SPINE FINDINGS  No prevertebral soft tissue swelling. There is straightening of the normal cervical lordosis. There is joint space narrowing and osteophytosis at C6-C7. Normal facet articulation. Normal craniocervical junction. No evidence of epidural or paraspinal hematoma  IMPRESSION: CT HEAD IMPRESSION  No intracranial trauma. Remote infarction left basal ganglia. Chronic atrophy and microvascular disease.  CT CERVICAL SPINE IMPRESSION  No cervical spine fracture. Mild disc osteophytic disease.  Straightening of the normal cervical lordosis may be secondary to position, muscle spasm, or ligamentous injury.   Electronically Signed   By: Genevive Bi M.D.   On: 11/29/2012 10:33   Ct Cervical Spine Wo Contrast  11/29/2012   CLINICAL DATA:  Seizure with fall, laceration above left eye  EXAM: CT HEAD WITHOUT CONTRAST  CT CERVICAL SPINE WITHOUT CONTRAST  TECHNIQUE: Multidetector CT imaging of the head and cervical spine was performed following the standard protocol without intravenous contrast. Multiplanar CT image reconstructions of the cervical spine were also generated.  COMPARISON:  Head CT 05/11/2003, brain MRI 07/23/2012  FINDINGS: CT HEAD FINDINGS  Exam degraded by patient head motion exam was repeated with some improvement. No acute intracranial hemorrhage. No focal mass lesion. No CT evidence of acute infarction. No midline shift or mass effect. No hydrocephalus. Basilar cisterns are patent.  There is mild generalized cortical atrophy. There are periventricular and subcortical white matter hypodensities. There is a deep white matter infarction in the left external capsule unchanged from prior.  Paranasal sinuses and  mastoid air cells are clear.  CT CERVICAL SPINE FINDINGS  No prevertebral soft tissue swelling. There is straightening of the normal cervical lordosis. There is joint space  narrowing and osteophytosis at C6-C7. Normal facet articulation. Normal craniocervical junction. No evidence of epidural or paraspinal hematoma  IMPRESSION: CT HEAD IMPRESSION  No intracranial trauma. Remote infarction left basal ganglia. Chronic atrophy and microvascular disease.  CT CERVICAL SPINE IMPRESSION  No cervical spine fracture. Mild disc osteophytic disease.  Straightening of the normal cervical lordosis may be secondary to position, muscle spasm, or ligamentous injury.   Electronically Signed   By: Genevive Bi M.D.   On: 11/29/2012 10:33    MEDICATIONS                                                                                                                       I have reviewed the patient's current medications.  ASSESSMENT/PLAN:  Symptomatic post stroke GTC seizures, controlled with keppra. Awaiting MRI brain today. Can switch to PO keppra 500 mg BID. Will follow up.  Wyatt Portela, MD [facility] [phone]  12/01/2012, 9:04 AM

## 2012-12-01 NOTE — Progress Notes (Signed)
  Date: 12/01/2012  Patient name: [name]  Medical record number: [id number]  Date of birth: 12-30-1945   This patient has been seen and the plan of care was discussed with the house staff. Please see their note for complete details. I concur with their findings with the following additions/corrections: Patient seen and examined.  On exam, she is oriented to person and place.  Dysarthria noted.  She is able to move all extremities.  MRI is pending today.  She is being followed by Neurology.  Keppra to 500 mg PO BID.  EEG with evidence of post-ictal slowing.    Jonah Blue, DO, FACP [facility] 12/01/2012, 11:55 AM

## 2012-12-01 NOTE — Progress Notes (Signed)
Pt sent down for MRI but was not cooperating Ativan 1mg  given  But pt still resisting treatment. MD on call paged.  awaiting call

## 2012-12-01 NOTE — Progress Notes (Signed)
Pt did not comply with MRI after 2 mg ativan given  . MD on call made aware . Pt resting quietly in bed now  . No episode so seizure like activity noted at this time

## 2012-12-01 NOTE — Progress Notes (Signed)
Pt had a silence seizure  . Which lasted for about 30 secs pt was awake but stares and did not response to questions for few seconds and started talking after that the patient sister was at bed side and MD made aware . New order for Keppra noted  And pharmacy notified. Rn will continue to monitor pt.

## 2012-12-01 NOTE — Procedures (Signed)
EEG report.  Brief clinical history: 67 years old with prior stroke and seizures, now on keppra. Persistent altered mental status after a seizure on the day of admission. Technique: this is a 17 channel routine scalp EEG performed at the bedside with bipolar and monopolar montages arranged in accordance to the international 10/20 system of electrode placement. One channel was dedicated to EKG recording.  No activating procedures performed.  Description: As the study begins and throughout the entire recording, there is diffuse, continuous, rather monomorphic theta slowing, at times with faster frequencies over the right hemisphere. No evidence of epileptiform discharges or electrographic seizures.  EKG showed sinus rhythm.  Impression: this is an abnormal EEG with findings consistent with a mild encephalopathy, non specific, but in in this particular clinical scenario most likely due to postictal slowing. No evidence of electrographic seizures. Clinical correlation is advised.    Wyatt Portela, MD

## 2012-12-01 NOTE — Progress Notes (Signed)
MRI unable to be completed due to pt being uncooperative, obtained order for prn Ativan, not given due to inability of MRI being completed.  MRI plans to re-try early am and will give ativan prior to MRI this am.

## 2012-12-01 NOTE — Progress Notes (Signed)
Pt will not comply for mri attempted X2. Pt tried to bite me X2 bit herself once tryingto bite me again. Tried to claw me with her nails. This pt needs to be out to have a MRI. ---

## 2012-12-01 NOTE — Progress Notes (Signed)
Patient with intermittent episodes of decreased responsiveness, staring, speech changes. Concern for partial complex seizures. Already received IV ativan. Will increase keppra to 1,000 mg IV BID.  Wyatt Portela, MD

## 2012-12-01 NOTE — Progress Notes (Signed)
Subjective: Patient seen at bedside. On exam, she is oriented to person and place. Dysarthria noted. She is able to move all extremities. Denies pain. She demonstrates intermittent episodes of decreased responsiveness, staring, aphasia, repetitive left shoulder and hand movements, concerning for partial complex seizures.  Objective: Vital signs in last 24 hours: Filed Vitals:   11/30/12 2307 12/01/12 0211 12/01/12 0635 12/01/12 1155  BP: 134/91 137/53 144/49 166/59  Pulse: 83 70 57 71  Temp: 98.8 F (37.1 C) 97.5 F (36.4 C)  97.8 F (36.6 C)  TempSrc: Oral Axillary  Oral  Resp: 18 22 20 20   SpO2: 95% 99% 100% 100%   Weight change:   Intake/Output Summary (Last 24 hours) at 12/01/12 1319 Last data filed at 12/01/12 1156  Gross per 24 hour  Intake    600 ml  Output      0 ml  Net    600 ml    Physical Exam: General: Elderly, thin AA female, in no acute distress; lying flat in bed Head: Normocephalic, bandage to left forehead, PERRLA, EOMI, Moist mucous membranes, no purulence, Neck supple Lungs: Shallow respiratory effort. Clear to auscultation bilaterally from apices to bases without crackles or wheezes appreciated anterior fields Heart: Regular rate, regular rhythm, normal S1 and S2, no gallop, murmur, or rubs appreciated. Abdomen: BS normoactive. Soft, Nondistended, non-tender. No masses or organomegaly appreciated. Extremities: No pretibial edema, distal pulses intact. No pain with palpation of right superior patella. Neurologic: Moves all extremities spontaneously, alert and oriented to self and place only. Intermittently complies with instructions for neuro exam (I can assess strength, EOMI, sticks out tongue, shrugs, will not smile or squint, will not comply with sensory exam). She demonstrates intermittent episodes of decreased responsiveness, staring, aphasia, repetitive left shoulder and hand movements, concerning for partial complex seizures.   Lab Results: Basic  Metabolic Panel:  Recent Labs Lab 11/29/12 0921 11/30/12 0028 11/30/12 0530 11/30/12 0535  NA 141 143  --  140  K 3.6 3.9  --  3.8  CL 104 103  --  102  CO2 22  --   --  25  GLUCOSE 116* 123*  --  94  BUN 16 16  --  18  CREATININE 0.85 1.30*  --  1.02  CALCIUM 10.7*  --   --  10.4  MG  --   --  1.8  --   PHOS  --   --  4.0  --    Liver Function Tests:  Recent Labs Lab 11/29/12 0921 11/30/12 0535  AST 20 24  ALT 11 12  ALKPHOS 74 67  BILITOT 0.5 1.1  PROT 7.7 7.2  ALBUMIN 4.2 3.7    Recent Labs Lab 11/30/12 0535  AMMONIA 41   CBC:  Recent Labs Lab 11/29/12 2307 11/30/12 0028 11/30/12 0535  WBC 6.6  --  6.0  NEUTROABS 5.1  --  3.9  HGB 11.5* 12.2 11.3*  HCT 33.4* 36.0 34.1*  MCV 87.9  --  88.3  PLT 207  --  209   CBG:  Recent Labs Lab 11/29/12 2356 11/30/12 2303 12/01/12 0755 12/01/12 1219  GLUCAP 108* 92 94 98   Hemoglobin A1C:  Recent Labs Lab 11/30/12 0535  HGBA1C 6.2*   Fasting Lipid Panel:  Recent Labs Lab 11/30/12 0535  CHOL 175  HDL 82  LDLCALC 81  TRIG 58  CHOLHDL 2.1   Thyroid Function Tests:  Recent Labs Lab 11/30/12 0535  TSH 1.554   Urine  Drug Screen: Drugs of Abuse     Component Value Date/Time   LABOPIA NONE DETECTED 11/30/2012 0110   COCAINSCRNUR NONE DETECTED 11/30/2012 0110   COCAINSCRNUR NEG 03/04/2008 1950   LABBENZ NONE DETECTED 11/30/2012 0110   LABBENZ NEG 03/04/2008 1950   AMPHETMU NONE DETECTED 11/30/2012 0110   AMPHETMU NEG 03/04/2008 1950   THCU NONE DETECTED 11/30/2012 0110   LABBARB NONE DETECTED 11/30/2012 0110    Urinalysis:  Recent Labs Lab 11/29/12 0949 11/30/12 0110  COLORURINE COLORLESS* YELLOW  LABSPEC 1.007 1.020  PHURINE 7.0 6.5  GLUCOSEU NEGATIVE NEGATIVE  HGBUR NEGATIVE NEGATIVE  BILIRUBINUR NEGATIVE SMALL*  KETONESUR NEGATIVE NEGATIVE  PROTEINUR NEGATIVE 30*  UROBILINOGEN 0.2 1.0  NITRITE NEGATIVE NEGATIVE  LEUKOCYTESUR NEGATIVE NEGATIVE   Micro Results: Recent  Results (from the past 240 hour(s))  MRSA PCR SCREENING     Status: None   Collection Time    11/30/12  2:21 AM      Result Value Range Status   MRSA by PCR NEGATIVE  NEGATIVE Final   Comment:            The GeneXpert MRSA Assay (FDA     approved for NASAL specimens     only), is one component of a     comprehensive MRSA colonization     surveillance program. It is not     intended to diagnose MRSA     infection nor to guide or     monitor treatment for     MRSA infections.   Studies/Results: Dg Chest 2 View  11/29/2012   CLINICAL DATA:  Altered mental status.  EXAM: CHEST  2 VIEW  COMPARISON:  05/28/2008 and thoracic spine CT examinations dated 07/23/2009 and 04/02/2009.  FINDINGS: Heart size near the upper limit of normal. Clear lungs. Mild scoliosis.  IMPRESSION: No acute abnormality.   Electronically Signed   By: Gordan Payment   On: 11/29/2012 23:46   Dg Knee 1-2 Views Right  11/30/2012   CLINICAL DATA:  Knee pain, fall.  EXAM: RIGHT KNEE - 1-2 VIEW  COMPARISON:  07/23/2012  FINDINGS: There is a moderate joint effusion. Irregularity noted at the superior pole of the patella, question fracture. No additional acute bony abnormality. No subluxation or dislocation.  IMPRESSION: Irregularity along the superior pole of the patella. Cannot exclude fracture. Recommend clinical correlation for pain in this area. Moderate joint effusion.   Electronically Signed   By: Charlett Nose M.D.   On: 11/30/2012 22:24   Ct Head Wo Contrast  11/30/2012   *RADIOLOGY REPORT*  Clinical Data: Altered mental status  CT HEAD WITHOUT CONTRAST  Technique:  Contiguous axial images were obtained from the base of the skull through the vertex without contrast.  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  There is diffuse patchy low density throughout the subcortical and periventricular white matter consistent with chronic small vessel ischemic  change.  There is no evidence for acute brain infarct, hemorrhage or mass.  The paranasal sinuses and mastoid air cells are clear.  The skull is intact.  There is a small left frontal hematoma.  IMPRESSION:  1.  No acute intracranial abnormalities.   Original Report Authenticated By: Signa Kell, M.D.   Medications: I have reviewed the patient's current medications. Scheduled Meds: . amLODipine  10 mg Oral Daily  . cloNIDine  0.2 mg Oral TID  . levETIRAcetam  1,000 mg Intravenous Q12H  .  lisinopril  20 mg Oral Daily  . LORazepam  0.5 mg Intravenous Once   Continuous Infusions:  PRN Meds:.acetaminophen, LORazepam  Assessment/Plan:  #Acute encephalopathy - Thought to be 2/2 Keppra loading vs. prolonged postictal period. Her oncologist Dr. Myna Hidalgo was notified of her admission. Per his expertise, it is rare for carcinoid to metastasize to the brain but he agrees with the plan for MRI. CT head/C-spine wnl. Mag 1.8, phos 4.0. Neurology on board. EEG showed no evidence of electrographic seizures but diffuse slowing consistent with encephalopathy or post ictal slowing.  - Appreciate neuro recs, agree with concern for partial complex seizures - Ativan 1mg  prn seizures - Increasing keppra to 1000mg  IV BID - Follow up MRI results (to be done this am) - Neuro checks, seizure precautions - NPO  #Knee pain s/p fall  - XR suggested possible fracture in superior pole of the patella. On exam, no clinical evidence of fracture. (No pain with palpation.) - Continue to monitor  #Seizure disorder - Per Dr. Myna Hidalgo thought to be 2/2 CVAs/chronic hypertension and prior cocaine use. Not from her metastatic carcinoid. - Continue IV keppra, will convert to po when medically stable  #HTN - Stable. - Continue home lisinopril, amlodipine, clonidine  #CKD - Stable. - Will avoid nephrotoxins - Continue home ACE-I  #Dispo  - Patient would like to go to a different SNF. - Social work consult placed for nursing  home placement  #DVT PPX - SCDs  Dispo: Disposition is deferred at this time, awaiting improvement of current medical problems.  Anticipated discharge in approximately 1-3 day(s).   The patient does have a current PCP Josph Macho, MD) and does need an [facility] hospital follow-up appointment after discharge.  The patient does not have transportation limitations that hinder transportation to clinic appointments.  .Services Needed at time of discharge: Y = Yes, Blank = No PT:   OT:   RN:   Equipment:   Other:     LOS: 2 days   Vivi Barrack, MD 12/01/2012, 1:19 PM

## 2012-12-02 LAB — GLUCOSE, CAPILLARY
Glucose-Capillary: 96 mg/dL (ref 70–99)
Glucose-Capillary: 115 mg/dL — ABNORMAL HIGH (ref 70–99)

## 2012-12-02 NOTE — Evaluation (Signed)
Clinical/Bedside Swallow Evaluation Patient Details  Name: [name] MRN: [id number] Date of Birth: [birth year]  Today's Date: 12/02/2012 Time: 1630-1700 SLP Time Calculation (min): 30 min  Past Medical History:  Past Medical History  Diagnosis Date  . 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 Myna Hidalgo w/sandostatin injections q monthly  . Anemia     iron deficiency  . Depression   . Hyperlipidemia   . Hypertension   . History of cocaine abuse     quit 03/06; seizure and HTN urgency secondary to use 12/04  . Seizures   . 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 Weight loss     15 lbs 08/07 (regained 104 08/07 and 112 lbs 12/07)  . Arthritis   . Halitosis     per notes  . Herpes zoster of eye 04/09    right eye  . Chronic kidney disease, stage III (moderate)     BL Cr 1.2-1.3   Past Surgical History:  Past Surgical History  Procedure Laterality Date  . Hip arthroplasty Right 07/24/2012    Procedure: ARTHROPLASTY BIPOLAR HIP;  Surgeon: Verlee Rossetti, MD;  Location: [facility] OR;  Service: Orthopedics;  Laterality: Right;   HPI:  clinical history: 67 years old with prior stroke and seizures, now on keppra. Persistent altered mental status after a seizure on the day of admission. BSE completed 11/30/12 with recommendations of dysphagia 3/thin liquids with no f/u required.   BSE ordered per stroke protocol.      Assessment / Plan / Recommendation Clinical Impression  BSE completed.  No outward s/s of aspiration noted throughout evaluaton.  Presenting with confusion and required max assist for patient to self administer PO's.  Recommend to resume dyspahgia 3 diet consistency and thin liquids with full supervision  with all meals due to cogntive deficits.  ST to follow briefly in acute care setting for diet tolerance due to fluctuating mental status.      Aspiration Risk  Mild    Diet Recommendation Dysphagia 3 (Mechanical Soft);Thin liquid   Liquid Administration via: Cup;Straw Medication Administration: Whole meds with liquid Supervision: Patient able to self feed;Full supervision/cueing for compensatory strategies Compensations: Slow rate;Small sips/bites Postural Changes and/or Swallow Maneuvers: Seated upright 90 degrees;Upright 30-60 min after meal    Other  Recommendations Oral Care Recommendations: Oral care QID   Follow Up Recommendations  Skilled Nursing facility    Frequency and Duration min 1 x/week  1 week       SLP Swallow Goals Patient will utilize recommended strategies during swallow to increase swallowing safety with: Supervision/safety   Swallow Study Prior Functional Status    Resident of SNF   General Date of Onset: 11/30/12 HPI: clinical history: 67 years old with prior stroke and seizures, now on keppra. Persistent altered mental status after a seizure on the day of admission. Type of Study: Bedside swallow evaluation Previous Swallow Assessment: BSE 11/30/12 dys 3 /thin  Diet Prior to this Study: NPO Temperature Spikes Noted: No Respiratory Status: Room air Behavior/Cognition: Alert;Confused;Impulsive;Distractible;Requires cueing;Cooperative;Pleasant mood Oral Cavity - Dentition: Poor condition;Missing dentition Self-Feeding Abilities: Needs assist;Able to feed self Patient Positioning: Upright in bed Baseline Vocal Quality: Clear Volitional Cough: Strong Volitional Swallow: Able to elicit  Oral/Motor/Sensory Function Overall Oral Motor/Sensory Function: Appears within functional limits for tasks assessed   Ice Chips Ice chips: Not tested   Thin Liquid Thin Liquid: Within functional limits Presentation: Cup;Spoon;Straw    Nectar Thick Nectar Thick  Liquid: Not tested   Honey Thick Honey Thick Liquid: Not tested   Puree Puree: Within functional limits Presentation: Self Fed;Spoon   Solid   GO    Solid: Impaired Oral Phase Functional Implications: Oral residue      Moreen Fowler MS, CCC-SLP [phone] [facility] 12/02/2012,6:56 PM

## 2012-12-02 NOTE — Progress Notes (Addendum)
Clinical Social Worker (CSW) met with sister Cathlean Marseilles at bedside of patient. CSW gave sister list of bed offers and encouraged her to tour the facilities. Sister agreed to look over the list and help choose a facility. Sister expressed concerns about patient living independently and would like the short term care to turn into long term care. CSW encouraged sister to start talking with the facility they choose about long term care planning.   Jetta Lout, LCSWA Weekend CSW [phone]   Per RN and CSW Sister Cathlean Marseilles would prefer patient to go back to [facility] and initiate long term care.   Jetta Lout, LCSWA Weekend CSW [phone]

## 2012-12-02 NOTE — Progress Notes (Signed)
MD notified sister concerned pt. Not receiving chemo injection and that patients right knee is swollen after fall at SNF.  No orders given at this time.

## 2012-12-02 NOTE — Progress Notes (Signed)
Pt noted to be trying to climb out of bed over the side rails, and was very agitated. While getting cleaned up patient started swatting, and biting at nursing staff. On call MD paged. Will continue to monitor.

## 2012-12-03 DIAGNOSIS — G92 Toxic encephalopathy: Secondary | ICD-10-CM

## 2012-12-03 LAB — GLUCOSE, CAPILLARY
Glucose-Capillary: 121 mg/dL — ABNORMAL HIGH (ref 70–99)
Glucose-Capillary: 138 mg/dL — ABNORMAL HIGH (ref 70–99)

## 2012-12-03 MED ORDER — SODIUM CHLORIDE 0.9 % IV SOLN
250.0000 mg | Freq: Two times a day (BID) | INTRAVENOUS | Status: DC
  Administered 2012-12-03 (×2): 250 mg via INTRAVENOUS
  Filled 2012-12-03 (×4): qty 2.5

## 2012-12-03 MED ORDER — LORAZEPAM 2 MG/ML IJ SOLN
1.0000 mg | INTRAMUSCULAR | Status: DC | PRN
  Administered 2012-12-04: 2 mg via INTRAVENOUS
  Filled 2012-12-03: qty 1

## 2012-12-03 NOTE — Progress Notes (Signed)
Hospital course by problem list:  #Acute encephalopathy - The patient was brought in to the ED after having a seizure at her nursing home, witnessed by the patients roommate.  A head CT did not show anything acute. Patient was loaded with Keppra as per the neurologist recommendation and discharged back to nursing home on oral Keppra. On patient arrival at the nursing home was found to be lethargic and nonverbal so patient was sent back to the ER. Neurology was consulted after re-arrival to ED, and EEG showed no evidence of electrographic seizures but diffuse slowing consistent with encephalopathy or post ictal slowing. She was admitted to the hospital, and multiple attempts were made to obtain an MRI of the brain but the patient was too agitated to undergo the study. Her IV Keppra dose was increased from 500 mg BID to 1000 mg BID, but this seemed to correlate with increased somnolence. On hospital day 4, the team decided that we may be overmedicating with Keppra, which can be sedating in elderly patients at high doses. We started a trial a period of lower dose (250 mg bid) and closely monitored her mental status.  We attempted a third MRI, which showed [insert study findings].  #Knee pain s/p fall - An x-ray on admission suggested possible fracture in superior pole of the patella, but clinical exam showed no evidence of fracture in that area.   #Seizure disorder - Her oncologist, Dr. Myna Hidalgo, suggests that these seizures could be due to CVAs/chronic hypertension and prior cocaine use, but not secondary to her metastatic carcinoid syndrome. We continued IV Keppra throughout her hospital course, and converted to PO dosing prior to discharge.  #Hypertension - Blood pressure was stable throughout the hospital course. We continued home lisinopril, amlodipine, clonidine.   #Chronic Kidney Disease - Was stable throughout hospital course, and we avoided nephrotoxins, continuing her home ACE inhibitor.

## 2012-12-03 NOTE — Evaluation (Signed)
Physical Therapy Evaluation Patient Details Name: [name] MRN: [id number] DOB: [birth year] Today's Date: 12/03/2012 Time: 4098-1191 PT Time Calculation (min): 14 min  PT Assessment / Plan / Recommendation History of Present Illness  67 y.o. female who was brought in yesterday after patient had a seizure at the nursing home. This was witnessed by patient's roommate as per the chart. Patient had a CT head which did not show anything acute. Patient was loaded with Keppra as per the neurologist recommendation and discharged back to nursing home on oral Keppra. On patient arrival at the nursing home was found to be lethargic and nonverbal so patient was sent back to the ER. On arrival patient was moving all extremities but found to be lethargic. On-call neurologist was consulted by the ER physician and was advised to get a repeat CT head. Repeat CT head did not show anything acute and at this time patient has been admitted for further management. On my exam patient presently is alert awake and oriented to time place and person and moves all extremities and follows commands. But the nurse states that patient has periods of confusion where patient is not able to clearly say where she is. Patient otherwise denies any chest pain or shortness of breath. Patient has a laceration on the left for head which has been sutured yesterday. Patient's past medical history significant for metastatic carcinoid tumor.  Clinical Impression  Pt admitted for seizure. Pt currently with functional limitations due to the deficits listed below (see PT Problem List). Pt is a poor historian secondary to lethargy during PT session; lethargy also made participating in therapy difficult as pt required total A (+2 assist) for bed mobility and unable to assess transfers/gait due to R knee pain.  Pt did state at the beginning of session that she would like to go back to [facility] upon d/c. Pt will benefit from skilled PT to increase  their independence and safety with mobility to allow discharge.    PT Assessment  Patient needs continued PT services    Follow Up Recommendations  SNF    Does the patient have the potential to tolerate intense rehabilitation      Barriers to Discharge        Equipment Recommendations  Other (comment) (TBD)    Recommendations for Other Services     Frequency Min 3X/week    Precautions / Restrictions Precautions Precautions: Fall Restrictions Weight Bearing Restrictions: No Other Position/Activity Restrictions: No current restrictions but imaging reports possible R patellar fracture.   Pertinent Vitals/Pain Pt reports R knee but is unable to rate pain. PT repositioned pt in bed to comfort.      Mobility  Bed Mobility Bed Mobility: Supine to Sit;Sit to Supine;Scooting to HOB Supine to Sit: 1: +2 Total assist Supine to Sit: Patient Percentage: 0% Sit to Supine: 1: +2 Total assist Sit to Supine: Patient Percentage: 0% Scooting to HOB: 1: +2 Total assist Scooting to [facility]: Patient Percentage: 0% Details for Bed Mobility Assistance: VC's and tactile cues to encourage pt to perform all bed mobility tasks, pt unable to participate due to lethargy and R knee pain (pt unable to rate pain).  Pt kept repeating "sit-up, sit-up" while in seated position, PT asked pt if she meant "lie down" and pt said yes. Pt's R LE kept in extended posiltion during bed mobility secondary to pt c/o new R knee pain. Transfers Transfers: Not assessed Ambulation/Gait Ambulation/Gait Assistance: Not tested (comment)    Exercises  PT Diagnosis: Generalized weakness;Acute pain;Difficulty walking  PT Problem List: Decreased strength;Decreased activity tolerance;Decreased mobility;Pain PT Treatment Interventions: Gait training;Functional mobility training;Therapeutic activities;Therapeutic exercise;Patient/family education;DME instruction (DME instruction as necessary.)     PT Goals(Current goals can  be found in the care plan section) Acute Rehab PT Goals Patient Stated Goal: To go back to where she was [facility] [address]). PT Goal Formulation: With patient Time For Goal Achievement: 12/17/12 Potential to Achieve Goals: Good  Visit Information  Last PT Received On: 12/03/12 Assistance Needed: +2 History of Present Illness: 67 y.o. female who was brought in yesterday after patient had a seizure at the nursing home. This was witnessed by patient's roommate as per the chart. Patient had a CT head which did not show anything acute. Patient was loaded with Keppra as per the neurologist recommendation and discharged back to nursing home on oral Keppra. On patient arrival at the nursing home was found to be lethargic and nonverbal so patient was sent back to the ER. On arrival patient was moving all extremities but found to be lethargic. On-call neurologist was consulted by the ER physician and was advised to get a repeat CT head. Repeat CT head did not show anything acute and at this time patient has been admitted for further management. On my exam patient presently is alert awake and oriented to time place and person and moves all extremities and follows commands. But the nurse states that patient has periods of confusion where patient is not able to clearly say where she is. Patient otherwise denies any chest pain or shortness of breath. Patient has a laceration on the left for head which has been sutured yesterday. Patient's past medical history significant for metastatic carcinoid tumor.       Prior Functioning  Home Living Family/patient expects to be discharged to:: Skilled nursing facility Prior Function Level of Independence: Needs assistance Comments: Pt extremely lethargic during session and unable to answer many questions about prior living situation. Communication Communication: Other (comment) (Pt required prompting to answer questions due to lethargy.)    Cognition   Cognition Arousal/Alertness: Lethargic Behavior During Therapy: Flat affect Overall Cognitive Status: Difficult to assess Difficult to assess due to: Level of arousal    Extremity/Trunk Assessment Lower Extremity Assessment Lower Extremity Assessment: Difficult to assess due to impaired cognition;RLE deficits/detail (Pt had difficult time participating due to lethargy.) RLE Deficits / Details: No movement of R LE observed, pain with lifting R LE to doff SCD and upon initiating bed mobility so maintained knee extension for transfers. RLE: Unable to fully assess due to pain   Balance    End of Session PT - End of Session Activity Tolerance: Patient limited by lethargy;Patient limited by pain Patient left: in bed;with call bell/phone within reach;with bed alarm set Nurse Communication: Mobility status  GP     Sol Blazing 12/03/2012, 12:04 PM

## 2012-12-03 NOTE — Progress Notes (Signed)
Internal Medicine Attending  Date: 12/03/2012  Patient name: [name] Medical record number: [id number] Date of birth: Jan 02, 1946 Age: 67 y.o. Gender: female  I saw and evaluated the patient. I reviewed the resident's note by Dr. Claudell Kyle and I agree with the resident's findings and plans as documented in her progress note.  On evaluation this morning she was much more somnolent, but arousable.  Although this could represent subclinical status, an EEG earlier in the admission failed to demonstrate this.  I suspect we may be causing the excessive somnolence with the Keppra.  We will therefore back down on the dose recommended in the elderly (250 mg PO BID) and titrate from there based upon clinical response.  MRI of the brain is also pending for today although a metastatic carcinoid lesion to the brain is not highly suspected.  We are anticipating discharge back to a SNF and this is being worked on while we complete our inpatient evaluation.

## 2012-12-03 NOTE — Evaluation (Signed)
I have reviewed this note and agree with all findings. Kati Merion Caton, PT, DPT Pager: [phone]   

## 2012-12-03 NOTE — Clinical Social Work Note (Signed)
CSW received note from weekend CSW that pt's sister does want pt to return to [facility] SNF once medically discharged from [facility]. Pt expressed to CSW that she does not want to return to [facility] once medically discharged on 11/30/2012.  CSW attempted to speak with pt at bedside on 12/03/2012, however, pt was sleeping and was not able to be aroused. CSW spoke with MD regarding information above. At this time, CSW will honor the pt's wishes for SNF placement. Please consult psych for capacity evaluation if deemed appropriate by MD.  Darlyn Chamber, MSW, LCSWA Clinical Social Work [phone]

## 2012-12-03 NOTE — Discharge Summary (Signed)
Name: [name] MRN: [id number] DOB: [birth year] 67 y.o. PCP: Jillian Macho, MD  Date of Admission: 11/29/2012 10:55 PM Date of Discharge: 12/05/2012 Attending Physician: Inez Catalina, MD  Discharge Diagnosis:  1. Acute encephalopathy 2. Carcinoid tumor with liver metastases 3. Knee pain s/p fall 4. Seizure disorder 5. Hypertension 6. Chronic kidney disease, GFR 64  Discharge Medications:   Medication List         amLODipine 10 MG tablet  Commonly known as:  NORVASC  Take 1 tablet (10 mg total) by mouth daily.     cloNIDine 0.2 MG tablet  Commonly known as:  CATAPRES  Take 1 tablet (0.2 mg total) by mouth 3 (three) times daily.     levETIRAcetam 500 MG tablet  Commonly known as:  KEPPRA  Take 0.5 tablets (250 mg total) by mouth 2 (two) times daily.     lisinopril 20 MG tablet  Commonly known as:  PRINIVIL,ZESTRIL  Take 1 tablet (20 mg total) by mouth daily.     Multivitamin/Iron Tabs  Take 1 tablet by mouth daily.        Disposition and follow-up:   Ms.[name] was discharged from [facility] in Stable condition.  At the hospital follow up visit please address:  1.  Has her mental status improved after metabolizing the Keppra and Ativan?  2.  Any recurrent seizures? Her Keppra dose may be increased after two weeks as needed.  3.  She is due for her Sandostatin 30 mg IM qmonth injection  4.  Labs / imaging needed at time of follow-up: None  5.  Pending labs/ test needing follow-up: None  Follow-up Appointments: Follow-up Information   Follow up with Jillian Macho, MD On 12/11/2012. (@9 :30am)    Specialty:  Oncology   Contact information:   [address] [place] [address] [phone]       Discharge Instructions: Discharge Orders   Future Appointments Provider Department Dept Phone   12/11/2012 9:30 AM Rachael Fee [facility] [phone]   12/11/2012  10:00 AM Jillian Macho, MD [facility] [phone]   12/11/2012 10:45 AM [facility] Inj Nurse [place] [facility] [phone]   12/28/2012 9:00 AM Rachael Fee [facility] [phone]   12/28/2012 9:30 AM Jillian Macho, MD [facility] [phone]   12/28/2012 10:15 AM [facility] In[facility] Nurse [place] [facility] [phone]   Future Orders Complete By Expires   Diet - low sodium heart healthy  As directed    Increase activity slowly  As directed       Consultations:    Procedures Performed:  Dg Chest 2 View  11/29/2012   CLINICAL DATA:  Altered mental status.  EXAM: CHEST  2 VIEW  COMPARISON:  05/28/2008 and thoracic spine CT examinations dated 07/23/2009 and 04/02/2009.  FINDINGS: Heart size near the upper limit of normal. Clear lungs. Mild scoliosis.  IMPRESSION: No acute abnormality.   Electronically Signed   By: Gordan Payment   On: 11/29/2012 23:46   Dg Knee 1-2 Views Right  11/30/2012   CLINICAL DATA:  Knee pain, fall.  EXAM: RIGHT KNEE - 1-2 VIEW  COMPARISON:  07/23/2012  FINDINGS: There is a moderate joint effusion. Irregularity noted at the superior pole of the patella, question fracture. No additional acute  bony abnormality. No subluxation or dislocation.  IMPRESSION: Irregularity along the superior pole of the patella. Cannot exclude fracture. Recommend clinical correlation for pain in this area. Moderate joint effusion.   Electronically Signed   By: Charlett Nose M.D.   On: 11/30/2012 22:24   Ct Head Wo Contrast  11/30/2012   *RADIOLOGY REPORT*  Clinical Data: Altered mental status  CT HEAD WITHOUT CONTRAST  Technique:  Contiguous axial images were obtained from the base of the skull through the vertex without contrast.  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  There is diffuse patchy  low density throughout the subcortical and periventricular white matter consistent with chronic small vessel ischemic change.  There is no evidence for acute brain infarct, hemorrhage or mass.  The paranasal sinuses and mastoid air cells are clear.  The skull is intact.  There is a small left frontal hematoma.  IMPRESSION:  1.  No acute intracranial abnormalities.   Original Report Authenticated By: Signa Kell, M.D.   Ct Head Wo Contrast  11/29/2012   CLINICAL DATA:  Seizure with fall, laceration above left eye  EXAM: CT HEAD WITHOUT CONTRAST  CT CERVICAL SPINE WITHOUT CONTRAST  TECHNIQUE: Multidetector CT imaging of the head and cervical spine was performed following the standard protocol without intravenous contrast. Multiplanar CT image reconstructions of the cervical spine were also generated.  COMPARISON:  Head CT 05/11/2003, brain MRI 07/23/2012  FINDINGS: CT HEAD FINDINGS  Exam degraded by patient head motion exam was repeated with some improvement. No acute intracranial hemorrhage. No focal mass lesion. No CT evidence of acute infarction. No midline shift or mass effect. No hydrocephalus. Basilar cisterns are patent.  There is mild generalized cortical atrophy. There are periventricular and subcortical white matter hypodensities. There is a deep white matter infarction in the left external capsule unchanged from prior.  Paranasal sinuses and  mastoid air cells are clear.  CT CERVICAL SPINE FINDINGS  No prevertebral soft tissue swelling. There is straightening of the normal cervical lordosis. There is joint space narrowing and osteophytosis at C6-C7. Normal facet articulation. Normal craniocervical junction. No evidence of epidural or paraspinal hematoma  IMPRESSION: CT HEAD IMPRESSION  No intracranial trauma. Remote infarction left basal ganglia. Chronic atrophy and microvascular disease.  CT CERVICAL SPINE IMPRESSION  No cervical spine fracture. Mild disc osteophytic disease.  Straightening of the  normal cervical lordosis may be secondary to position, muscle spasm, or ligamentous injury.   Electronically Signed   By: Genevive Bi M.D.   On: 11/29/2012 10:33   Ct Cervical Spine Wo Contrast  11/29/2012   CLINICAL DATA:  Seizure with fall, laceration above left eye  EXAM: CT HEAD WITHOUT CONTRAST  CT CERVICAL SPINE WITHOUT CONTRAST  TECHNIQUE: Multidetector CT imaging of the head and cervical spine was performed following the standard protocol without intravenous contrast. Multiplanar CT image reconstructions of the cervical spine were also generated.  COMPARISON:  Head CT 05/11/2003, brain MRI 07/23/2012  FINDINGS: CT HEAD FINDINGS  Exam degraded by patient head motion exam was repeated with some improvement. No acute intracranial hemorrhage. No focal mass lesion. No CT evidence of acute infarction. No midline shift or mass effect. No hydrocephalus. Basilar cisterns are patent.  There is mild generalized cortical atrophy. There are periventricular and subcortical white matter hypodensities. There is a deep white matter infarction in the left external capsule unchanged from prior.  Paranasal sinuses and  mastoid air cells  are clear.  CT CERVICAL SPINE FINDINGS  No prevertebral soft tissue swelling. There is straightening of the normal cervical lordosis. There is joint space narrowing and osteophytosis at C6-C7. Normal facet articulation. Normal craniocervical junction. No evidence of epidural or paraspinal hematoma  IMPRESSION: CT HEAD IMPRESSION  No intracranial trauma. Remote infarction left basal ganglia. Chronic atrophy and microvascular disease.  CT CERVICAL SPINE IMPRESSION  No cervical spine fracture. Mild disc osteophytic disease.  Straightening of the normal cervical lordosis may be secondary to position, muscle spasm, or ligamentous injury.   Electronically Signed   By: Genevive Bi M.D.   On: 11/29/2012 10:33   MRI HEAD WITHOUT CONTRAST  12/04/12  Technique: Multiplanar, multiecho pulse  sequences of the brain and  surrounding structures were obtained according to standard protocol  without intravenous contrast.  Comparison: CT of head November 30, 2012.MRI of the head Jul 23, 2012.  Findings: Moderately motion degraded examination; per technologist,  the patient was unable to remain still for the examination.  Multiple sequences reattempted without image quality improvement.  No reduced diffusion to suggest acute ischemia. Remote left basal  ganglia hemorrhagic infarction with cystic transformation though,  gradient sequences was not. Ex-vacuo dilatation of left ventricle  atrium without hydrocephalous. Mild asymmetrically smaller left  cerebral peduncle most consistent with Wallerian degeneration. No  midline shift or mass effect.  Patchy confluence supratentorial white matter T2 hyperintensities  with mass effect. Small wedge-like T2 hyperintensities with the  left greater than right cerebellum most consistent with remote  infarcts, unchanged.  No abnormal extra-axial fluid collections or masses. Major  intracranial vascular flow voids seen at the skull base.  No paranasal sinus air-fluid levels, mastoid air cells appear well  aerated. No suspicious calvarial bone marrow signal. Sella is not  abnormally expanded.  IMPRESSION:  Moderately motion degraded examination without MR findings of acute  ischemia. If suspicion persists, consider repeat MRI when the  patient is better able to tolerate imaging.  Remote hemorrhagic left basal ganglia infarct, remote cerebellar  infarcts.  Moderate white matter changes suggest chronic small vessel ischemic  disease, relatively unchanged.  Original Report Authenticated By: Awilda Metro   Admission HPI:  SARAYA TIREY is a 68 y.o. female who was brought in yesterday after patient had a seizure at the nursing home. This was witnessed by patient's roommate as per the chart. Patient had a CT head which did not show  anything acute. Patient was loaded with Keppra as per the neurologist recommendation and discharged back to nursing home on oral Keppra. On patient arrival at the nursing home was found to be lethargic and nonverbal so patient was sent back to the ER. On arrival patient was moving all extremities but found to be lethargic. On-call neurologist was consulted by the ER physician and was advised to get a repeat CT head. Repeat CT head did not show anything acute and at this time patient has been admitted for further management. On my exam patient presently is alert awake and oriented to time place and person and moves all extremities and follows commands. But the nurse states that patient has periods of confusion where patient is not able to clearly say where she is. Patient otherwise denies any chest pain or shortness of breath. Patient has a laceration on the left for head which has been sutured yesterday. Patient's past medical history significant for metastatic carcinoid tumor.   Hospital Course by problem list:  1. Acute encephalopathy - The patient  was brought in to the ED after having a seizure at her nursing home, witnessed by the patients roommate. A head CT did not show anything acute. Patient was loaded with Keppra per neurology's recommendation and discharged back to her nursing home on oral Keppra 500mg  BID. When the patient arrived at the nursing home, however, she was found to be lethargic and nonverbal so was sent back to the ER. Neurology was consulted again after re-arrival to the ED, and suggested CT head, EEG to rule out status and MRI to rule out brain involvement of her metastatic carcinoid tumor. Repeat CT head was normal. EEG showed no evidence of electrographic seizures, but diffuse slowing consistent with encephalopathy or post ictal slowing. Multiple attempts were made to obtain an MRI of the brain but were aborted 2/2 patient agitation. On the third try on hospital day 5 she was able to  tolerate the study. The image was moderately degraded 2/2 motion but there was no suggestion of acute infarct or brain mets.   On hospital day 2 the patient demonstrated intermittent episodes of decreased responsiveness, staring, aphasia, and repetitive left shoulder and hand movements, concerning for complex partial seizure activity. Her IV Keppra dose was increased from 500 mg BID to 1000 mg BID per neuro recs. However, on hospital day 3 her exam did not change and she demonstrated increased somnolence. On hospital day 4, we felt her clinical picture was less likely seizure activity and more likely overmedication with Keppra, which can be sedating in elderly patients at high doses, +/- the Ativan given 2/2 agitation when trying to get her MRIs. She has CKD and liver mets, so her ability to metabolize drugs normally is likely altered. We started a trial a period of a lower dose of Keppra at 250 mg bid and closely monitored her mental status. By the day of discharge she was still more somnolent than normal per her sister, but alert, oriented to self, and able to follow motor commands.  Also on the differential is psychiatric disease (patient has a history of depression) and hypoactive delirium given waxing and waning picture with some periods of agitation. However, we have ruled out all acute or dangerous causes on acute encephalopathy and feel this work up can be pursued as an outpatient if she does not mentally clear in the next few days.   2. Carcinoid tumor with liver metastases - Followed by Dr. Myna Hidalgo. LFTs were stable. She gets Sandostatin 30mg  IM qMonth at office visits; she is due for a dose this month, but per Dr. Myna Hidalgo it was not urgent to provide this during her hospital stay. MRI was ordered to evaluate for brain mets, though this is very rare, and it was negative.  3. Knee pain s/p fall - An x-ray on admission suggested possible fracture in the superior pole of the patella sustained during  seizure, clinical correlation was recommended. Exam did not suggest fracture in that area as there was no pain with direct palpation. Please continue to monitor.  4. Seizure disorder - Thought to be due to CVAs/chronic hypertension and prior cocaine use, not her metastatic carcinoid tumor. We continued IV Keppra throughout her hospital course, and converted to PO dosing prior to discharge. EEG showed no evidence of electrographic seizures, but diffuse slowing consistent with encephalopathy or post ictal slowing.  5. Hypertension - Blood pressure was stable throughout the hospital course. We continued her home lisinopril, amlodipine, clonidine.   6. Chronic Kidney Disease - Was stable throughout  hospital course. We avoided nephrotoxins and continued her home ACE inhibitor.   Discharge Vitals:   BP 140/46  Pulse 84  Temp(Src) 97.4 F (36.3 C) (Oral)  Resp 18  Ht 5\' 4"  (1.626 m)  Wt 118 lb 11.2 oz (53.842 kg)  BMI 20.36 kg/m2  SpO2 100%  Discharge Labs:  Results for orders placed during the hospital encounter of 11/29/12 (from the past 24 hour(s))  GLUCOSE, CAPILLARY     Status: Abnormal   Collection Time    12/04/12  4:21 PM      Result Value Range   Glucose-Capillary 106 (*) 70 - 99 mg/dL  GLUCOSE, CAPILLARY     Status: None   Collection Time    12/04/12  9:02 PM      Result Value Range   Glucose-Capillary 88  70 - 99 mg/dL   Comment 1 Documented in Chart     Comment 2 Notify RN    GLUCOSE, CAPILLARY     Status: None   Collection Time    12/05/12  6:45 AM      Result Value Range   Glucose-Capillary 72  70 - 99 mg/dL   Comment 1 Documented in Chart     Comment 2 Notify RN    GLUCOSE, CAPILLARY     Status: None   Collection Time    12/05/12 11:59 AM      Result Value Range   Glucose-Capillary 91  70 - 99 mg/dL    Signed: Vivi Barrack, MD 12/05/2012, 12:53 PM   Time Spent on Discharge: 30 minutes Services Ordered on Discharge: None Equipment Ordered on Discharge:  None

## 2012-12-03 NOTE — Progress Notes (Signed)
  I have seen and examined the patient, and reviewed the daily progress note by Alicia Amel, MS 3 and discussed the care of the patient with them. Please see my progress note from 12/03/2012 for further details regarding assessment and plan.    Signed:  Vivi Barrack, MD 12/03/2012, 2:47 PM

## 2012-12-03 NOTE — Progress Notes (Signed)
Subjective: Patient seen at bedside. On exam, she is somnolent but following simple motor commands. After much prodding she is able to tell us she is oriented to person and place but not year (says "1947"). Dysarthria noted. She is able to move all extremities. Denies pain.  Objective: Vital signs in last 24 hours: Filed Vitals:   12/03/12 0028 12/03/12 0157 12/03/12 0556 12/03/12 0955  BP: 155/61 156/52 153/50 156/77  Pulse:  71 72 71  Temp:  97.7 F (36.5 C) 100.7 F (38.2 C) 97.3 F (36.3 C)  TempSrc:    Oral  Resp:  16 16 18   SpO2:  100% 100% 100%   Weight change:   Intake/Output Summary (Last 24 hours) at 12/03/12 1138 Last data filed at 12/03/12 0818  Gross per 24 hour  Intake    120 ml  Output      0 ml  Net    120 ml    Physical Exam: General: Elderly, thin AA female, in no acute distress; lying flat in bed Head: Normocephalic, bandage to left forehead, PERRLA, EOMI, Moist mucous membranes, no purulence, Neck supple Lungs: Shallow respiratory effort. Clear to auscultation bilaterally from apices to bases without crackles or wheezes appreciated anterior fields Heart: Regular rate, regular rhythm, normal S1 and S2, no gallop, murmur, or rubs appreciated. Abdomen: BS normoactive. Soft, Nondistended, non-tender. No masses or organomegaly appreciated. Extremities: No pretibial edema, distal pulses intact. No pain with palpation of right superior patella. Neurologic: Moves all extremities spontaneously, alert and oriented to self and place only. Intermittently complies with physical exam instructions.   Lab Results: Basic Metabolic Panel:  Recent Labs Lab 11/29/12 0921 11/30/12 0028 11/30/12 0530 11/30/12 0535  NA 141 143  --  140  K 3.6 3.9  --  3.8  CL 104 103  --  102  CO2 22  --   --  25  GLUCOSE 116* 123*  --  94  BUN 16 16  --  18  CREATININE 0.85 1.30*  --  1.02  CALCIUM 10.7*  --   --  10.4  MG  --   --  1.8  --   PHOS  --   --  4.0  --    Liver  Function Tests:  Recent Labs Lab 11/29/12 0921 11/30/12 0535  AST 20 24  ALT 11 12  ALKPHOS 74 67  BILITOT 0.5 1.1  PROT 7.7 7.2  ALBUMIN 4.2 3.7    Recent Labs Lab 11/30/12 0535  AMMONIA 41   CBC:  Recent Labs Lab 11/29/12 2307 11/30/12 0028 11/30/12 0535  WBC 6.6  --  6.0  NEUTROABS 5.1  --  3.9  HGB 11.5* 12.2 11.3*  HCT 33.4* 36.0 34.1*  MCV 87.9  --  88.3  PLT 207  --  209   CBG:  Recent Labs Lab 12/01/12 2257 12/02/12 0651 12/02/12 1127 12/02/12 1632 12/02/12 2130 12/03/12 0649  GLUCAP 96 95 88 115* 138* 105*   Hemoglobin A1C:  Recent Labs Lab 11/30/12 0535  HGBA1C 6.2*   Fasting Lipid Panel:  Recent Labs Lab 11/30/12 0535  CHOL 175  HDL 82  LDLCALC 81  TRIG 58  CHOLHDL 2.1   Thyroid Function Tests:  Recent Labs Lab 11/30/12 0535  TSH 1.554   Urine Drug Screen: Drugs of Abuse     Component Value Date/Time   LABOPIA NONE DETECTED 11/30/2012 0110   COCAINSCRNUR NONE DETECTED 11/30/2012 0110   COCAINSCRNUR NEG 03/04/2008 1950  LABBENZ NONE DETECTED 11/30/2012 0110   LABBENZ NEG 03/04/2008 1950   AMPHETMU NONE DETECTED 11/30/2012 0110   AMPHETMU NEG 03/04/2008 1950   THCU NONE DETECTED 11/30/2012 0110   LABBARB NONE DETECTED 11/30/2012 0110    Urinalysis:  Recent Labs Lab 11/29/12 0949 11/30/12 0110  COLORURINE COLORLESS* YELLOW  LABSPEC 1.007 1.020  PHURINE 7.0 6.5  GLUCOSEU NEGATIVE NEGATIVE  HGBUR NEGATIVE NEGATIVE  BILIRUBINUR NEGATIVE SMALL*  KETONESUR NEGATIVE NEGATIVE  PROTEINUR NEGATIVE 30*  UROBILINOGEN 0.2 1.0  NITRITE NEGATIVE NEGATIVE  LEUKOCYTESUR NEGATIVE NEGATIVE   Micro Results: Recent Results (from the past 240 hour(s))  MRSA PCR SCREENING     Status: None   Collection Time    11/30/12  2:21 AM      Result Value Range Status   MRSA by PCR NEGATIVE  NEGATIVE Final   Comment:            The GeneXpert MRSA Assay (FDA     approved for NASAL specimens     only), is one component of a      comprehensive MRSA colonization     surveillance program. It is not     intended to diagnose MRSA     infection nor to guide or     monitor treatment for     MRSA infections.   Studies/Results: No results found. Medications: I have reviewed the patient's current medications. Scheduled Meds: . amLODipine  10 mg Oral Daily  . cloNIDine  0.2 mg Oral TID  . levETIRAcetam  250 mg Intravenous Q12H  . lisinopril  20 mg Oral Daily  . LORazepam  0.5 mg Intravenous Once   Continuous Infusions:  PRN Meds:.acetaminophen, hydrALAZINE, LORazepam  Assessment/Plan:  #Acute encephalopathy - Thought to be 2/2 Keppra loading vs. prolonged postictal period. Neurology on board. EEG showed no evidence of electrographic seizures but diffuse slowing consistent with encephalopathy or post ictal slowing. Her exam is unchanged from yesterday before we increased Keppra from 500>1000mg  IV BID. If anything, she is more somnolent. Therefore seizure is less likely, especially as her EEG showed no seizure activity. It is possible we are overmedicating with Keppra, which can be sedating in elderly patients at high doses. We will trial a period of lower dose and closely monitor her mental status. - Appreciate neuro recs - Ativan 1-2mg  prn seizures - Decreasing keppra to 250mg  IV BID - Follow up MRI results (third try this am, premedicating with Ativan) - Neuro checks, seizure precautions - Dysphagia 3 diet  #Knee pain s/p fall  - XR suggested possible fracture in superior pole of the patella, recommended clinical correlation. On exam, no clinical evidence of fracture in that area. (No pain with direct palpation.) - Continue to monitor  #Seizure disorder - Per Dr. Myna Hidalgo thought to be 2/2 CVAs/chronic hypertension and prior cocaine use. Not from her metastatic carcinoid. - Continue IV keppra, will convert to po when medically stable  #HTN - Stable. - Continue home lisinopril, amlodipine, clonidine  #CKD -  Stable. - Will avoid nephrotoxins - Continue home ACE-I  #Dispo  - Appreciate social work Scientist, forensic, patient would like to return to her prior SNF when medically ready for discharge  #DVT PPX - SCDs  Dispo: Disposition is deferred at this time, awaiting improvement of current medical problems.  Anticipated discharge in approximately 1-3 day(s).   The patient does have a current PCP Josph Macho, MD) and does need an [facility] hospital follow-up appointment after discharge.  The patient does  not have transportation limitations that hinder transportation to clinic appointments.  .Services Needed at time of discharge: Y = Yes, Blank = No PT:   OT:   RN:   Equipment:   Other:     LOS: 4 days   Vivi Barrack, MD 12/03/2012, 11:38 AM

## 2012-12-04 ENCOUNTER — Inpatient Hospital Stay (HOSPITAL_COMMUNITY): Payer: Medicare HMO

## 2012-12-04 LAB — GLUCOSE, CAPILLARY: Glucose-Capillary: 105 mg/dL — ABNORMAL HIGH (ref 70–99)

## 2012-12-04 MED ORDER — LORAZEPAM 2 MG/ML IJ SOLN
INTRAMUSCULAR | Status: AC
  Administered 2012-12-04: 1 mg
  Filled 2012-12-04: qty 1

## 2012-12-04 MED ORDER — LORAZEPAM 2 MG/ML IJ SOLN: 1.0000 mg | Freq: Once | INTRAMUSCULAR | Status: AC

## 2012-12-04 MED ORDER — LEVETIRACETAM 250 MG PO TABS
250.0000 mg | ORAL_TABLET | Freq: Two times a day (BID) | ORAL | Status: DC
  Administered 2012-12-04 – 2012-12-05 (×3): 250 mg via ORAL
  Filled 2012-12-04 (×4): qty 1

## 2012-12-04 NOTE — Progress Notes (Signed)
I spoke with the patient's sister, [name]. She is concerned about how "quiet" her sister is. Apparently she is normally very talkative, conversant, and oriented. We discussed the plan of care including backing down on the Keppra. We also talked about SNF placement. The family would like [name] to go back to [facility]. They think it is a good facility. The patient has always wanted to go back to living at home, though this is not realistic given her many medical problems requiring 24 hour care. Her sister thinks that when we asked the patient this morning if she wanted to go back to [facility] and said "no", she really meant she would rather go home, but of course this is not tenable.   I do think we should rule out acute stroke given her altered mental status. I will therefore re-order her MRI stat. Hopefully she will be able to tolerate the study this time with premedication with Ativan. This will be attempt #3 at getting the MRI.  Vivi Barrack, MD [phone]

## 2012-12-04 NOTE — Progress Notes (Signed)
Speech Language Pathology Dysphagia Treatment Patient Details Name: [name] MRN: [id number] DOB: 1946/01/26 Today's Date: 12/04/2012 Time: 2536-6440 SLP Time Calculation (min): 19 min  Assessment / Plan / Recommendation Clinical Impression  Swallowing treatment provided at bedside, pt observed with dinner tray of Dys 3 (mech soft) solids and thin liquids with straw. Pt presented with no overt s/s of aspiration and swallow function WFL. Pt with suspected delay in swallow secondary to audible swallow, however vocal quality remained clear. Pt recommended to continue dys 3 diet with thin liquids, no SLP f/u needed.     Diet Recommendation  Continue with Current Diet: Dysphagia 3 (mechanical soft);Thin liquid    SLP Plan Discharge SLP treatment due to (comment);All goals met (goals met. )   Pertinent Vitals/Pain N/a   Swallowing Goals  SLP Swallowing Goals Patient will utilize recommended strategies during swallow to increase swallowing safety with: Supervision/safety  General Temperature Spikes Noted: No Respiratory Status: Room air Behavior/Cognition: Alert;Cooperative;Confused;Requires cueing;Distractible Oral Cavity - Dentition: Poor condition;Missing dentition Patient Positioning: Upright in bed  Oral Cavity - Oral Hygiene Does patient have any of the following "at risk" factors?: Tongue - coated Patient is HIGH RISK - Oral Care Protocol followed (see row info): Yes Patient is AT RISK - Oral Care Protocol followed (see row info): Yes   Dysphagia Treatment Treatment focused on: Skilled observation of diet tolerance Treatment Methods/Modalities: Skilled observation Patient observed directly with PO's: Yes Type of PO's observed: Dysphagia 3 (soft);Thin liquids Feeding: Able to feed self;Needs set up Liquids provided via: Straw Pharyngeal Phase Signs & Symptoms: Suspected delayed swallow initiation Type of cueing: Verbal Amount of cueing: Minimal   GO     Lyanne Co CCC-SLP 12/04/2012, 4:39 PM

## 2012-12-04 NOTE — Progress Notes (Signed)
Subjective: Patient seen at bedside. She is more alert today. She is oriented to person, but thinks she is still at [facility] and the year is "1946". She is able to move all extremities. Denies pain. When asked if she wants to go back to [facility], she says "no" but does not give a reason. When we rounded on her as a team and asked the same question, she said "stop bothering me."  Objective: Vital signs in last 24 hours: Filed Vitals:   12/03/12 0955 12/03/12 1759 12/03/12 2200 12/04/12 0200  BP: 156/77 101/50 111/54 118/55  Pulse: 71 72 77 61  Temp: 97.3 F (36.3 C) 98.8 F (37.1 C) 98.2 F (36.8 C) 97.3 F (36.3 C)  TempSrc: Oral Oral    Resp: 18 18 18 18   SpO2: 100% 99% 98% 98%   Weight change:   Intake/Output Summary (Last 24 hours) at 12/04/12 0730 Last data filed at 12/03/12 0818  Gross per 24 hour  Intake    120 ml  Output      0 ml  Net    120 ml    Physical Exam: General: Elderly, thin AA female, in no acute distress; lying flat in bed Head: Normocephalic, bandage to left forehead, PERRLA, EOMI, Moist mucous membranes, no purulence, Neck supple Lungs: Shallow respiratory effort. Clear to auscultation bilaterally from apices to bases without crackles or wheezes appreciated anterior fields Heart: Regular rate, regular rhythm, normal S1 and S2, no gallop, murmur, or rubs appreciated. Abdomen: BS normoactive. Soft, Nondistended, non-tender. No masses or organomegaly appreciated. Extremities: No pretibial edema, distal pulses intact. Neurologic: Moves all extremities spontaneously, improved alertness. Oriented to self only. 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    Lab Results: Basic Metabolic Panel:  Recent Labs Lab 11/29/12 0921 11/30/12 0028 11/30/12 0530 11/30/12 0535  NA 141 143  --  140  K 3.6 3.9  --  3.8  CL 104 103  --  102  CO2 22  --   --  25  GLUCOSE 116* 123*  --  94  BUN 16 16   --  18  CREATININE 0.85 1.30*  --  1.02  CALCIUM 10.7*  --   --  10.4  MG  --   --  1.8  --   PHOS  --   --  4.0  --    Liver Function Tests:  Recent Labs Lab 11/29/12 0921 11/30/12 0535  AST 20 24  ALT 11 12  ALKPHOS 74 67  BILITOT 0.5 1.1  PROT 7.7 7.2  ALBUMIN 4.2 3.7    Recent Labs Lab 11/30/12 0535  AMMONIA 41   CBC:  Recent Labs Lab 11/29/12 2307 11/30/12 0028 11/30/12 0535  WBC 6.6  --  6.0  NEUTROABS 5.1  --  3.9  HGB 11.5* 12.2 11.3*  HCT 33.4* 36.0 34.1*  MCV 87.9  --  88.3  PLT 207  --  209   CBG:  Recent Labs Lab 12/02/12 1127 12/02/12 1632 12/02/12 2130 12/03/12 0649 12/03/12 1137 12/03/12 1630  GLUCAP 88 115* 138* 105* 143* 104*   Hemoglobin A1C:  Recent Labs Lab 11/30/12 0535  HGBA1C 6.2*   Fasting Lipid Panel:  Recent Labs Lab 11/30/12 0535  CHOL 175  HDL 82  LDLCALC 81  TRIG 58  CHOLHDL 2.1   Thyroid Function Tests:  Recent Labs Lab 11/30/12 0535  TSH 1.554  Urine Drug Screen: Drugs of Abuse     Component Value Date/Time   LABOPIA NONE DETECTED 11/30/2012 0110   COCAINSCRNUR NONE DETECTED 11/30/2012 0110   COCAINSCRNUR NEG 03/04/2008 1950   LABBENZ NONE DETECTED 11/30/2012 0110   LABBENZ NEG 03/04/2008 1950   AMPHETMU NONE DETECTED 11/30/2012 0110   AMPHETMU NEG 03/04/2008 1950   THCU NONE DETECTED 11/30/2012 0110   LABBARB NONE DETECTED 11/30/2012 0110    Urinalysis:  Recent Labs Lab 11/29/12 0949 11/30/12 0110  COLORURINE COLORLESS* YELLOW  LABSPEC 1.007 1.020  PHURINE 7.0 6.5  GLUCOSEU NEGATIVE NEGATIVE  HGBUR NEGATIVE NEGATIVE  BILIRUBINUR NEGATIVE SMALL*  KETONESUR NEGATIVE NEGATIVE  PROTEINUR NEGATIVE 30*  UROBILINOGEN 0.2 1.0  NITRITE NEGATIVE NEGATIVE  LEUKOCYTESUR NEGATIVE NEGATIVE   Micro Results: Recent Results (from the past 240 hour(s))  MRSA PCR SCREENING     Status: None   Collection Time    11/30/12  2:21 AM      Result Value Range Status   MRSA by PCR NEGATIVE  NEGATIVE Final    Comment:            The GeneXpert MRSA Assay (FDA     approved for NASAL specimens     only), is one component of a     comprehensive MRSA colonization     surveillance program. It is not     intended to diagnose MRSA     infection nor to guide or     monitor treatment for     MRSA infections.   Studies/Results: No results found. Medications: I have reviewed the patient's current medications. Scheduled Meds: . amLODipine  10 mg Oral Daily  . cloNIDine  0.2 mg Oral TID  . levETIRAcetam  250 mg Intravenous Q12H  . lisinopril  20 mg Oral Daily  . LORazepam  0.5 mg Intravenous Once   Continuous Infusions:  PRN Meds:.acetaminophen, hydrALAZINE, LORazepam  Assessment/Plan:  #Acute encephalopathy - Thought to be 2/2 Keppra loading vs. prolonged postictal period vs. subclinical status. Neurology on board. EEG showed no evidence of electrographic seizures but diffuse slowing consistent with encephalopathy or post ictal slowing. Her exam is improved from yesterday with increased alertness. No seizure activity was noted overnight. We likely were overmedicating her with Keppra, which can be sedating in elderly patients at high doses. 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 - Appreciate neuro recs - Ativan 1-2mg  prn seizures - Converting keppra to 250mg  po BID - Neuro checks, seizure precautions - Dysphagia 3 diet  #Knee pain s/p fall  - XR suggested possible fracture in superior pole of the patella, radiologist recommended clinical correlation. On exam, there was no clinical evidence of fracture in that area. (No pain with direct palpation.) - Continue to monitor  #Seizure disorder - Per Dr. 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 - Continue po keppra  #HTN - Stable. - Continue home lisinopril, amlodipine, clonidine  #CKD - Stable. - Will avoid nephrotoxins - Continue home ACE-I  #Dispo  - Appreciate social work Scientist, forensic re: returning to [facility]. Apparently her wishes and her sister's may not be the same. The CSW will speak to the patient later today to clear this up, now that she is more alert.  If her decision making capacity is still in question, we may have to consult psychiatry for a capacity evaluation this afternoon. - Medically stable for discharge pending SNF decision  #DVT PPX - SCDs  Dispo: Disposition is deferred at this time, awaiting improvement of current medical problems.  Anticipated discharge in approximately 1-3 day(s).   The patient does have a current PCP Josph Macho, MD) and does need an [facility] hospital follow-up appointment after discharge.  The patient does not have transportation limitations that hinder transportation to clinic appointments.  .Services Needed at time of discharge: Y = Yes, Blank = No PT:   OT:   RN:   Equipment:   Other:     LOS: 5 days   Vivi Barrack, MD 12/04/2012, 7:30 AM

## 2012-12-04 NOTE — Progress Notes (Signed)
  Date: 12/04/2012  Patient name: [name]  Medical record number: [id number]  Date of birth: [birth year]   This patient has been seen and the plan of care was discussed with the house staff. Please see their note for complete details. I concur with their findings with the following additions/corrections:  Agree with plan to attempt MRI, given further information provided by patients sister.  SNF is likely most appropriate place for discharge when we approach that point.   Inez Catalina, MD 12/04/2012, 4:21 PM

## 2012-12-05 ENCOUNTER — Telehealth: Payer: Self-pay | Admitting: Hematology & Oncology

## 2012-12-05 ENCOUNTER — Encounter: Payer: Self-pay | Admitting: Internal Medicine

## 2012-12-05 LAB — GLUCOSE, CAPILLARY: Glucose-Capillary: 72 mg/dL (ref 70–99)

## 2012-12-05 MED ORDER — LEVETIRACETAM 500 MG PO TABS: 250.0000 mg | ORAL_TABLET | Freq: Two times a day (BID) | ORAL | Status: AC

## 2012-12-05 NOTE — Progress Notes (Addendum)
Subjective: Patient seen at bedside. She is quiet this morning, only telling us her name in response to orientation questions. She is able to move all extremities and follow simple motor commands. Denies pain.  Objective: Vital signs in last 24 hours: Filed Vitals:   12/04/12 1815 12/04/12 2138 12/05/12 0143 12/05/12 0533  BP: 125/46 112/69 98/62 125/81  Pulse: 81 70 55 56  Temp: 98.5 F (36.9 C) 97.5 F (36.4 C) 97.9 F (36.6 C) 98 F (36.7 C)  TempSrc: Oral Oral Oral Oral  Resp: 17 18 18 18   Height:      Weight:      SpO2: 94% 99% 98% 100%   Weight change:  No intake or output data in the 24 hours ending 12/05/12 0902   Physical Exam: General: Elderly, thin AA female, in no acute distress; lying flat in bed Head: Normocephalic, bandage to left forehead, PERRLA, EOMI, Moist mucous membranes, no purulence, Neck supple Lungs: Shallow respiratory effort. Clear to auscultation bilaterally from apices to bases without crackles or wheezes appreciated anterior fields Heart: Regular rate, regular rhythm, normal S1 and S2, no gallop, murmur, or rubs appreciated. Abdomen: BS normoactive. Soft, Nondistended, non-tender. No masses or organomegaly appreciated. Extremities: No pretibial edema, distal pulses intact. Neurologic: Somnolent. Moves all extremities spontaneously. Oriented to self only. 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    Lab Results: Basic Metabolic Panel:  Recent Labs Lab 11/29/12 0921 11/30/12 0028 11/30/12 0530 11/30/12 0535  NA 141 143  --  140  K 3.6 3.9  --  3.8  CL 104 103  --  102  CO2 22  --   --  25  GLUCOSE 116* 123*  --  94  BUN 16 16  --  18  CREATININE 0.85 1.30*  --  1.02  CALCIUM 10.7*  --   --  10.4  MG  --   --  1.8  --   PHOS  --   --  4.0  --    Liver Function Tests:  Recent Labs Lab 11/29/12 0921 11/30/12 0535  AST 20 24  ALT 11 12  ALKPHOS 74 67  BILITOT 0.5 1.1    PROT 7.7 7.2  ALBUMIN 4.2 3.7    Recent Labs Lab 11/30/12 0535  AMMONIA 41   CBC:  Recent Labs Lab 11/29/12 2307 11/30/12 0028 11/30/12 0535  WBC 6.6  --  6.0  NEUTROABS 5.1  --  3.9  HGB 11.5* 12.2 11.3*  HCT 33.4* 36.0 34.1*  MCV 87.9  --  88.3  PLT 207  --  209   CBG:  Recent Labs Lab 12/03/12 0649 12/03/12 1137 12/03/12 1630 12/03/12 2149 12/04/12 1155 12/04/12 1621  GLUCAP 105* 143* 104* 105* 132* 106*   Hemoglobin A1C:  Recent Labs Lab 11/30/12 0535  HGBA1C 6.2*   Fasting Lipid Panel:  Recent Labs Lab 11/30/12 0535  CHOL 175  HDL 82  LDLCALC 81  TRIG 58  CHOLHDL 2.1   Thyroid Function Tests:  Recent Labs Lab 11/30/12 0535  TSH 1.554   Urine Drug Screen: Drugs of Abuse     Component Value Date/Time   LABOPIA NONE DETECTED 11/30/2012 0110   COCAINSCRNUR NONE DETECTED 11/30/2012 0110   COCAINSCRNUR NEG 03/04/2008 1950   LABBENZ NONE DETECTED 11/30/2012 0110   LABBENZ NEG 03/04/2008 1950   AMPHETMU NONE DETECTED 11/30/2012 0110   AMPHETMU NEG 03/04/2008 1950  THCU NONE DETECTED 11/30/2012 0110   LABBARB NONE DETECTED 11/30/2012 0110    Urinalysis:  Recent Labs Lab 11/29/12 0949 11/30/12 0110  COLORURINE COLORLESS* YELLOW  LABSPEC 1.007 1.020  PHURINE 7.0 6.5  GLUCOSEU NEGATIVE NEGATIVE  HGBUR NEGATIVE NEGATIVE  BILIRUBINUR NEGATIVE SMALL*  KETONESUR NEGATIVE NEGATIVE  PROTEINUR NEGATIVE 30*  UROBILINOGEN 0.2 1.0  NITRITE NEGATIVE NEGATIVE  LEUKOCYTESUR NEGATIVE NEGATIVE   Micro Results: Recent Results (from the past 240 hour(s))  MRSA PCR SCREENING     Status: None   Collection Time    11/30/12  2:21 AM      Result Value Range Status   MRSA by PCR NEGATIVE  NEGATIVE Final   Comment:            The GeneXpert MRSA Assay (FDA     approved for NASAL specimens     only), is one component of a     comprehensive MRSA colonization     surveillance program. It is not     intended to diagnose MRSA     infection nor to  guide or     monitor treatment for     MRSA infections.   Studies/Results: Mr Sherrin Daisy Contrast  12/05/2012   *RADIOLOGY REPORT*  Clinical Data: Evaluate for stroke, history of seizure.  MRI HEAD WITHOUT CONTRAST  Technique:  Multiplanar, multiecho pulse sequences of the brain and surrounding structures were obtained according to standard protocol without intravenous contrast.  Comparison: CT of head November 30, 2012.MRI of the head Jul 23, 2012.  Findings: Moderately motion degraded examination; per technologist, the patient was unable to remain still for the examination. Multiple sequences reattempted without image quality improvement.  No reduced diffusion to suggest acute ischemia.  Remote left basal ganglia hemorrhagic infarction with cystic transformation though, gradient sequences was not.  Ex-vacuo dilatation of left ventricle atrium without hydrocephalous. Mild asymmetrically smaller left cerebral peduncle most consistent with Wallerian degeneration.  No midline shift or mass effect.  Patchy confluence supratentorial white matter T2 hyperintensities with mass effect. Small wedge-like T2 hyperintensities with the left greater than right cerebellum most consistent with remote infarcts, unchanged.  No abnormal extra-axial fluid collections or masses.  Major intracranial vascular flow voids seen at the skull base.  No paranasal sinus air-fluid levels, mastoid air cells appear well aerated.  No suspicious calvarial bone marrow signal.  Sella is not abnormally expanded.  IMPRESSION: Moderately motion degraded examination without MR findings of acute ischemia.  If suspicion persists, consider repeat MRI when the patient is better able to tolerate imaging.  Remote hemorrhagic left basal ganglia infarct, remote cerebellar infarcts.  Moderate white matter changes suggest chronic small vessel ischemic disease, relatively unchanged.   Original Report Authenticated By: Awilda Metro   Medications: I have  reviewed the patient's current medications. Scheduled Meds: . amLODipine  10 mg Oral Daily  . cloNIDine  0.2 mg Oral TID  . levETIRAcetam  250 mg Oral BID  . lisinopril  20 mg Oral Daily  . LORazepam  0.5 mg Intravenous Once   Continuous Infusions:  PRN Meds:.acetaminophen, hydrALAZINE, LORazepam  Assessment/Plan:  #Acute encephalopathy - Still with altered mental status. Per sister Marylouise Stacks, she is more talkative at baseline and usually knows where she is. Patient was finally able to tolerate an MRI yesterday. Image was moderately degraded 2/2 motion but there was no suggestion of acute infarct or brain mets. Our differential still includes medication side effect. She has CKD and liver mets, so her ability  to metabolize drugs may be altered. She received 3mg  ativan for her MRI late last night. We may still be seeing the effects of that sedation this morning on exam. Also on the differential is psychiatric disease (patient has a history of depression) and hypoactive delirium given waxing and waning picture with some periods of agitation. However, we have ruled out all acute or dangerous causes on acute encephalopathy. - Appreciate neuro recs - Ativan 1-2mg  prn seizures - Continue keppra 250mg  po BID - Neuro checks, seizure precautions - Dysphagia 3 diet - Follow up am CMP, CBC to rule out metabolic etiology - but low suspicion as her lab work has all been normal thus far. This should not hold up her discharge. - Medically stable for discharge to SNF  #Knee pain s/p fall  - XR suggested possible fracture in superior pole of the patella, radiologist recommended clinical correlation. On exam, there was no clinical evidence of fracture in that area. (No pain with direct palpation.) - Continue to monitor  #Seizure disorder - Per Dr. 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 - Continue po keppra  #HTN - Stable. - Continue home  lisinopril, amlodipine, clonidine  #CKD - Stable. - Will avoid nephrotoxins - Continue home ACE-I  #Dispo  - Patient will go back to [facility] in their long term facility.  #DVT PPX - SCDs  Dispo: Disposition is deferred at this time, awaiting improvement of current medical problems.  Anticipated discharge in approximately 1-3 day(s).   The patient does have a current PCP Josph Macho, MD) and does need an [facility] hospital follow-up appointment after discharge.  The patient does not have transportation limitations that hinder transportation to clinic appointments.  .Services Needed at time of discharge: Y = Yes, Blank = No PT:   OT:   RN:   Equipment:   Other:     LOS: 6 days   Vivi Barrack, MD 12/05/2012, 9:02 AM

## 2012-12-05 NOTE — Progress Notes (Signed)
Student Pharmacist rounding with [facility] [employer], asked to consult for levetiracetam dosing in this 67 year old woman who was admitted to the hospital on November 30, 2012 for acute encephalopathy status post seizure on 11/29/12, in which she was initiated on levetiracetam 500 mg twice daily.  Upon admission, levetiracetam was increased to 1000 mg IV twice daily.  Today she appears somnolent and is very difficult to arouse.  Have evaluated her baseline laboratory values as documented in her history and physical exam.  Her blood pressure is 156/77, pulse 71, respirations 18, SpO2 100% room air.  Due to the patient's age, somnolence, and the new addition of levetiracetam to her medication regimen, I would recommend reducing the dose of levetiracetam from 1000 mg IV every 12 hours to 250 mg IV every 12 hours.  This decision is based on studies performed examining antiepileptic therapy in elderly patients.  Levetiracetam has the potential to cause both behavioral problems and drowsiness when used at elevated doses.  In general, after a loading dose of levetiracetam is given, it is recommended to initiate a maintenance dose of 250 mg to 500 mg IV every 12 hours.  The dose may be increased two weeks after commencing therapy as needed per the patient.  Additionally, since the patient has a history of depression, she should be monitored for worsening signs of depression and behavioral issues as that could be an adverse effect related to levetiracetam.   Russ Halo, PharmD Candidate

## 2012-12-05 NOTE — Telephone Encounter (Signed)
Dr. Criselda Peaches made 9-23 appointment for pt will let her know

## 2012-12-06 ENCOUNTER — Non-Acute Institutional Stay (SKILLED_NURSING_FACILITY): Payer: Medicare HMO | Admitting: Internal Medicine

## 2012-12-06 DIAGNOSIS — I150 Renovascular hypertension: Secondary | ICD-10-CM

## 2012-12-06 DIAGNOSIS — R569 Unspecified convulsions: Secondary | ICD-10-CM

## 2012-12-06 DIAGNOSIS — D499 Neoplasm of unspecified behavior of unspecified site: Secondary | ICD-10-CM

## 2012-12-10 NOTE — Discharge Summary (Signed)
I saw [name] on day of discharge and agree with formulated plan.

## 2012-12-11 ENCOUNTER — Ambulatory Visit (HOSPITAL_BASED_OUTPATIENT_CLINIC_OR_DEPARTMENT_OTHER): Payer: Commercial Managed Care - HMO | Admitting: Lab

## 2012-12-11 ENCOUNTER — Ambulatory Visit (HOSPITAL_BASED_OUTPATIENT_CLINIC_OR_DEPARTMENT_OTHER): Payer: Medicare HMO

## 2012-12-11 ENCOUNTER — Ambulatory Visit (HOSPITAL_BASED_OUTPATIENT_CLINIC_OR_DEPARTMENT_OTHER): Payer: Medicare HMO | Admitting: Hematology & Oncology

## 2012-12-11 VITALS — BP 177/58 | HR 82 | Temp 98.3°F | Resp 14 | Ht 64.0 in | Wt 105.0 lb

## 2012-12-11 DIAGNOSIS — C787 Secondary malignant neoplasm of liver and intrahepatic bile duct: Secondary | ICD-10-CM

## 2012-12-11 DIAGNOSIS — D499 Neoplasm of unspecified behavior of unspecified site: Secondary | ICD-10-CM

## 2012-12-11 DIAGNOSIS — E340 Carcinoid syndrome: Secondary | ICD-10-CM

## 2012-12-11 LAB — CBC WITH DIFFERENTIAL (CANCER CENTER ONLY)
BASO#: 0 10*3/uL (ref 0.0–0.2)
EOS%: 1 % (ref 0.0–7.0)
Eosinophils Absolute: 0.1 10*3/uL (ref 0.0–0.5)
HGB: 11.3 g/dL — ABNORMAL LOW (ref 11.6–15.9)
LYMPH%: 26.1 % (ref 14.0–48.0)
MCH: 30.1 pg (ref 26.0–34.0)
MCHC: 32.4 g/dL (ref 32.0–36.0)
MCV: 93 fL (ref 81–101)
MONO%: 5.9 % (ref 0.0–13.0)
Platelets: 347 10*3/uL (ref 145–400)
RBC: 3.76 10*6/uL (ref 3.70–5.32)

## 2012-12-11 MED ORDER — OCTREOTIDE ACETATE 30 MG IM KIT
30.0000 mg | PACK | Freq: Once | INTRAMUSCULAR | Status: AC
  Administered 2012-12-11: 30 mg via INTRAMUSCULAR

## 2012-12-11 MED ORDER — OCTREOTIDE ACETATE 30 MG IM KIT
PACK | INTRAMUSCULAR | Status: AC
  Filled 2012-12-11: qty 1

## 2012-12-11 NOTE — Progress Notes (Signed)
This office note has been dictated.

## 2012-12-13 ENCOUNTER — Non-Acute Institutional Stay (SKILLED_NURSING_FACILITY): Payer: Medicare HMO | Admitting: Internal Medicine

## 2012-12-13 DIAGNOSIS — D630 Anemia in neoplastic disease: Secondary | ICD-10-CM

## 2012-12-13 DIAGNOSIS — E210 Primary hyperparathyroidism: Secondary | ICD-10-CM

## 2012-12-13 NOTE — Progress Notes (Signed)
PROGRESS NOTE  DATE: 12/13/2012  FACILITY:  [facility]  LEVEL OF CARE: SNF (31)  Acute Visit  CHIEF COMPLAINT:  Manage anemia for neoplasm and primary hyperparathyroidism  HISTORY OF PRESENT ILLNESS: I was requested by the staff to assess the patient regarding above problem(s):  ANEMIA: The anemia is unstable. The patient denies fatigue, melena or hematochezia. She is currently not on iron. On 12-12-12 hemoglobin 10.3, MCV 92. In 6/14 hemoglobin 11.5.  PRIMARY HYPERPARATHYROIDISM: 19-24-14 calcium 10.5. In 6/14 calcium 11, ionized calcium 5.7 and intact PTH 90. Patient denies any neuromuscular problems.  PAST MEDICAL HISTORY : Reviewed.  No changes.  CURRENT MEDICATIONS: Reviewed per [facility]  REVIEW OF SYSTEMS: Difficult to obtain, overall the patient is a poor historian  PHYSICAL EXAMINATION  GENERAL: no acute distress, normal body habitus NECK: supple, trachea midline, no neck masses, no thyroid tenderness, no thyromegaly RESPIRATORY: breathing is even & unlabored, BS CTAB CARDIAC: RRR, no murmur,no extra heart sounds, left lower extremity has +2 edema, right lower extremity has +1 edema GI: abdomen soft, normal BS, no masses, no tenderness, no hepatomegaly, no splenomegaly PSYCHIATRIC: the patient is alert & oriented to person, affect & behavior appropriate  LABS/RADIOLOGY: See history of present illness  ASSESSMENT/PLAN:  Anemia of neoplasm-hemoglobin improved Primary hyperparathyroidism-calcium improved.  CPT CODE: 21308

## 2012-12-17 ENCOUNTER — Telehealth: Payer: Self-pay | Admitting: Hematology & Oncology

## 2012-12-17 NOTE — Telephone Encounter (Signed)
Pt moved to [facility], sister is taking appointments and had me change address. She is aware of 10-21 and 11-18

## 2012-12-18 LAB — COMPREHENSIVE METABOLIC PANEL
ALT: 10 U/L (ref 0–35)
Albumin: 4.3 g/dL (ref 3.5–5.2)
Alkaline Phosphatase: 87 U/L (ref 39–117)
BUN: 21 mg/dL (ref 6–23)
CO2: 26 mEq/L (ref 19–32)
Glucose, Bld: 140 mg/dL — ABNORMAL HIGH (ref 70–99)
Potassium: 3.7 mEq/L (ref 3.5–5.3)
Sodium: 142 mEq/L (ref 135–145)
Total Protein: 7.3 g/dL (ref 6.0–8.3)

## 2012-12-18 LAB — CHROMOGRANIN A: Chromogranin A: 238 ng/mL — ABNORMAL HIGH (ref 1.9–15.0)

## 2012-12-18 NOTE — Progress Notes (Signed)
CC:   [name] [facility] and [place], [place] [phone]  DIAGNOSES: 1. Metastatic carcinoid tumor with liver metastases. 2. Recent onset of seizures.  CURRENT THERAPY:  Sandostatin 30 mg IM monthly.  INTERIM HISTORY:  [name] comes in for a followup.  It seems like things are going rough for her right now.  We last saw her back in July. At that point in time, she had recently broken her right hip.  This was a closed fracture.  She did not require any surgery for this.  However, unfortunately, she now is having issues with seizures.  She has had a thorough evaluation for this.  I think she was hospitalized back about 2 weeks ago.  She had some mental status issues.  Again, she had I think recurrent seizures.  She had a CT of the brain which was unremarkable.  MRI of the brain was done which showed some motion degradation.  She had a remote left basal ganglia infarct.  There were some cerebellar infarcts.  Otherwise, there was nothing that would suggest an etiology for the seizures.  I am sure she was seen by Neurology.  I am not sure if she had any type of EEG or other workup.  She is in today.  She lives at the nursing home.  Blood pressure is still an issue.  We will see about increasing her clonidine.  She may also could benefit from the Maxzide at a low dose.  She really does not get around all that much.  She is in a wheelchair. She has always had some ambulatory issues because of a past CVA.  Her appetite seems to be doing okay.  She has lost a little weight since we last saw her.  PHYSICAL EXAMINATION:  General:  This is a somewhat elderly-appearing African American female in no obvious distress.  Vital signs: Temperature of 98.3, pulse 82, respiratory rate 18, blood pressure 177/58.  Weight is 105.  Head and neck:  Normocephalic, atraumatic skull.  There are no ocular or oral lesions.  There are no palpable cervical or supraclavicular lymph nodes.  Lungs:  Clear  bilaterally. Cardiac:  Regular rate and rhythm with an occasional extra beat.  She has no murmurs, rubs, or bruits.  Abdomen:  Soft.  She has good bowel sounds.  There is no abdominal mass.  There is no palpable hepatosplenomegaly.  Extremities:  Some weakness in her legs.  This is symmetric.  Neurological.  No focal neurological deficits.  LABORATORY STUDIES:  White cell count is 6.3, hemoglobin 11.3, hematocrit 34.9, platelet count 247,000.  Her last chromogranin A level was 106.  IMPRESSION:  [name] is a very charming 67 year old African American female with a longstanding history of carcinoid.  We have had her on Sandostatin.  This really has done well for her.  We will see what her next chromogranin A level is.  This will be interesting to obtain.  Again, I do not see how the carcinoid would be related to the seizures. I do not see her having carcinoid syndrome.  Again, I believe that the seizures may be from hypertension.  Her blood pressure has always been very difficult to control.  We will plan to give her the Sandostatin today.  I will plan to see her back in 2 months.    ______________________________ Josph Macho, M.D. PRE/MEDQ  D:  12/11/2012  T:  12/18/2012  Job:  [id number]

## 2012-12-25 ENCOUNTER — Ambulatory Visit: Payer: Medicare HMO | Admitting: Diagnostic Neuroimaging

## 2012-12-28 ENCOUNTER — Ambulatory Visit: Payer: Medicare HMO | Admitting: Hematology & Oncology

## 2012-12-28 ENCOUNTER — Ambulatory Visit: Payer: Medicare HMO

## 2012-12-28 ENCOUNTER — Other Ambulatory Visit: Payer: Medicare HMO | Admitting: Lab

## 2012-12-28 ENCOUNTER — Ambulatory Visit: Payer: Medicare HMO | Admitting: Diagnostic Neuroimaging

## 2012-12-28 ENCOUNTER — Telehealth: Payer: Self-pay

## 2012-12-28 NOTE — Telephone Encounter (Signed)
Called patient's sister to cancel appt. She advised to call [facility] to notify them appt cancelled today due to provider being ill. Spoke to rehab. Confirmed. Renee from [facility] said she would deliver message and have nurse to call back to reschedule.

## 2013-01-04 NOTE — Progress Notes (Signed)
Patient ID: [name], female   DOB: [birth year], 67 y.o.   MRN: [id number]        HISTORY & PHYSICAL  DATE: 12/06/2012   FACILITY: [facility]  LEVEL OF CARE: SNF (31)  ALLERGIES:  No Known Allergies  CHIEF COMPLAINT:  Manage seizure disorder, carcinoid tumor, and chronic kidney disease.    HISTORY OF PRESENT ILLNESS:  The patient is a 67 year-old, African-American female who was hospitalized with acute encephalopathy.  After hospitalization, she is admitted to this facility for long-term care management.    SEIZURE DISORDER:  The patient apparently had a witnessed seizure in the nursing home.  CT of the head did not show any acute findings.  EEG was negative for epileptiform activity.  She was loaded with Keppra and now is on Keppra.  The patient's seizure disorder remains stable. No complications reported from the medications presently being used. Staff do not report any recent seizure activity.    She is a poor historian.    CARCINOID TUMOR:  The patient has a history of carcinoid tumor with liver metastasis.  She is currently followed by the oncologist.  Most recent LFTs have been stable.  She receives Sandostatin monthly in her office visits to the oncologist.  MRI of the head was negative for any metastases.    CHRONIC KIDNEY DISEASE: The patient's chronic kidney disease remains stable.  Patient denies increasing lower extremity swelling or confusion. Last BUN and creatinine are:    BUN 18, creatinine 1.02.     PAST MEDICAL HISTORY :  Past Medical History  Diagnosis Date  . 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 Myna Hidalgo w/sandostatin injections q monthly  . Anemia     iron deficiency  . Depression   . Hyperlipidemia   . Hypertension   . History of cocaine abuse     quit 03/06; seizure and HTN urgency secondary to use 12/04  . Seizures   . 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 Weight loss     15 lbs 08/07 (regained 104 08/07 and 112 lbs 12/07)  . Arthritis   . Halitosis     per notes  . Herpes zoster of eye 04/09    right eye  . Chronic kidney disease, stage III (moderate)     BL Cr 1.2-1.3    PAST SURGICAL HISTORY: Past Surgical History  Procedure Laterality Date  . Hip arthroplasty Right 07/24/2012    Procedure: ARTHROPLASTY BIPOLAR HIP;  Surgeon: Verlee Rossetti, MD;  Location: [facility] OR;  Service: Orthopedics;  Laterality: Right;    SOCIAL HISTORY:  reports that she has never smoked. She does not have any smokeless tobacco history on file. She reports that she does not drink alcohol or use illicit drugs.  FAMILY HISTORY:  Family History  Problem Relation Age of Onset  . Cancer Mother   . Cancer Father   . Diabetes Brother     CURRENT MEDICATIONS: Reviewed per [facility]  REVIEW OF SYSTEMS:  Difficult to obtain.  Patient is a poor historian.    PHYSICAL EXAMINATION  VS:  T 98.6       P 80      RR 16  BP 150/72      POX 98%        WT (Lb) 120.9    GENERAL: no acute distress, normal body habitus EYES: conjunctivae normal, sclerae normal, normal eye lids MOUTH/THROAT: lips without lesions,no lesions in the mouth,tongue is without lesions,uvula elevates in midline NECK: supple, trachea midline, no neck masses, no thyroid tenderness, no thyromegaly LYMPHATICS: no LAN in the neck, no supraclavicular LAN RESPIRATORY: breathing is even & unlabored, BS CTAB CARDIAC: RRR, no murmur,no extra heart sounds, no edema GI:  ABDOMEN: abdomen soft, normal BS, no masses, no tenderness  LIVER/SPLEEN: no hepatomegaly, no splenomegaly MUSCULOSKELETAL: HEAD: normal to inspection & palpation BACK: no kyphosis, scoliosis or spinal processes  tenderness EXTREMITIES: LEFT UPPER EXTREMITY: full range of motion, normal strength & tone RIGHT UPPER EXTREMITY: strength decreased, range of motion moderate   LEFT LOWER EXTREMITY: strength decreased, range of motion minimal   RIGHT LOWER EXTREMITY: strength decreased, range of motion minimal   PSYCHIATRIC: the patient is alert & oriented to person, decreased affect and mood    LABS/RADIOLOGY: Urine drug screen negative.    Urinalysis negative.    MRSA by PCR negative.     Chest x-ray:  No acute disease.    Hemoglobin 11.3, MCV 88.3, otherwise CBC normal.    Hemoglobin A1c 6.2.    Fasting lipid panel normal.    TSH 1.554.    Liver profile normal.     Magnesium 1.8, phosphorus 4.    Right knee x-ray:  Cannot exclude fracture.    CT of the cervical spine:   No acute fracture.    ASSESSMENT/PLAN:  Seizure disorder.  Stable.    Carcinoid tumor.   No recurrent metastases.     Chronic kidney disease stage III.  Reassess renal functions.    Renovascular hypertension.  Blood pressure elevated.  We will review a log.    Check CBC and BMP.    I have reviewed patient's medical records received at admission/from hospitalization.  CPT CODE: 16109

## 2013-01-07 ENCOUNTER — Ambulatory Visit (HOSPITAL_BASED_OUTPATIENT_CLINIC_OR_DEPARTMENT_OTHER): Payer: Commercial Managed Care - HMO

## 2013-01-07 VITALS — BP 150/81 | HR 79 | Temp 98.8°F | Resp 20

## 2013-01-07 DIAGNOSIS — C787 Secondary malignant neoplasm of liver and intrahepatic bile duct: Secondary | ICD-10-CM

## 2013-01-07 DIAGNOSIS — E340 Carcinoid syndrome: Secondary | ICD-10-CM

## 2013-01-07 DIAGNOSIS — D499 Neoplasm of unspecified behavior of unspecified site: Secondary | ICD-10-CM

## 2013-01-07 MED ORDER — OCTREOTIDE ACETATE 30 MG IM KIT
30.0000 mg | PACK | Freq: Once | INTRAMUSCULAR | Status: AC
  Administered 2013-01-07: 30 mg via INTRAMUSCULAR

## 2013-01-07 NOTE — Patient Instructions (Signed)

## 2013-01-08 ENCOUNTER — Ambulatory Visit: Payer: Medicare HMO

## 2013-01-10 ENCOUNTER — Non-Acute Institutional Stay (SKILLED_NURSING_FACILITY): Payer: Medicare HMO | Admitting: Internal Medicine

## 2013-01-10 DIAGNOSIS — E210 Primary hyperparathyroidism: Secondary | ICD-10-CM

## 2013-01-10 DIAGNOSIS — I150 Renovascular hypertension: Secondary | ICD-10-CM

## 2013-01-10 DIAGNOSIS — D499 Neoplasm of unspecified behavior of unspecified site: Secondary | ICD-10-CM

## 2013-01-10 DIAGNOSIS — I6990 Unspecified sequelae of unspecified cerebrovascular disease: Secondary | ICD-10-CM

## 2013-01-10 NOTE — Progress Notes (Signed)
PROGRESS NOTE  DATE: 01-10-13  FACILITY: Nursing Home Location: [facility]  LEVEL OF CARE: SNF (31)  Routine Visit  CHIEF COMPLAINT:  Manage hypertension, CVA and carcinoid tumor  HISTORY OF PRESENT ILLNESS:  REASSESSMENT OF ONGOING PROBLEM(S):  HTN: Pt 's HTN remains stable.  Denies CP, sob, DOE, headaches, dizziness or visual disturbances.  No complications from the medications currently being used.  Last BP : 142/72, 120/62, 146/68.  CVA: The patient's CVA remains stable.  Patient denies new neurologic symptoms such as numbness, tingling, weakness, speech difficulties or visual disturbances.  No complications reported from the medications currently being used. She has a history of hemorrhagic CVA and residual right-sided hemiparesis.  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 She is status post bowel resection. She is currently followed by the oncologist and is on Sandostatin injections q. monthly. She denies ongoing abdominal pain melena, hematochezia, nausea or vomiting.  PAST MEDICAL HISTORY : Reviewed.  No changes.  CURRENT MEDICATIONS: Reviewed per [facility]  REVIEW OF SYSTEMS: Difficult to obtain, patient is a poor historian  PHYSICAL EXAMINATION  VS:  T 98.1      P 60      RR 18      BP 146/68     POX %     WT (Lb) 120  GENERAL: no acute distress, normal body habitus EYES: Normal sclerae, normal conjunctivae, no discharge NECK: supple, trachea midline, no neck masses, no thyroid tenderness, no thyromegaly LYMPHATICS: No cervical lymphadenopathy, supraclavicular lymphadenopathy RESPIRATORY: breathing is even & unlabored, BS CTAB CARDIAC: RRR, no murmur,no extra heart sounds, right lower extremity has +2 edema and left lower extremity has +1 edema GI: abdomen soft, normal BS, no masses, no tenderness, no hepatomegaly, no splenomegaly PSYCHIATRIC: the patient is alert & oriented to person, affect & behavior  appropriate  LABS/RADIOLOGY:  9-14 hemoglobin 10.3, MCV 92 otherwise CBC normal, calcium 10.5 otherwise BMP normal  6/14 calcium 11, ionized calcium 5.7, intact PTH 90, CBC normal, calcium 10.6 otherwise CMP normal  ASSESSMENT/PLAN:  Renovascular hypertension-Uncontrolled.  Increase lisinopril to 40 mg daily. CVA-continue supportive care. Carcinoid tumor- status post resection. Primary hyperparathyroidism-patient asymptomatic. Will monitor. History of seizure disorder-no episodes off antiseizure medications. Dementia-Exelon patch started. Depression-Remeron started Check BMP on 01-14-13  CPT CODE: 78295

## 2013-01-17 ENCOUNTER — Non-Acute Institutional Stay (SKILLED_NURSING_FACILITY): Payer: Medicare HMO | Admitting: Internal Medicine

## 2013-01-17 DIAGNOSIS — R739 Hyperglycemia, unspecified: Secondary | ICD-10-CM

## 2013-01-17 DIAGNOSIS — N179 Acute kidney failure, unspecified: Secondary | ICD-10-CM

## 2013-01-17 DIAGNOSIS — R7309 Other abnormal glucose: Secondary | ICD-10-CM

## 2013-01-24 ENCOUNTER — Non-Acute Institutional Stay (SKILLED_NURSING_FACILITY): Payer: Medicare HMO | Admitting: Internal Medicine

## 2013-01-25 NOTE — Progress Notes (Signed)
PROGRESS NOTE  DATE: 01/24/2013  FACILITY:  [facility]  LEVEL OF CARE: SNF (31)  Acute Visit  CHIEF COMPLAINT:  Manage renal insufficiency  HISTORY OF PRESENT ILLNESS: I was requested by the staff to assess the patient regarding above problem(s):  On 01-18-13 Cr 1.09.  On 01-14-13 Cr 1.15.  Pt denies confusion on increasing LE swelling.  PAST MEDICAL HISTORY : Reviewed.  No changes.  CURRENT MEDICATIONS: Reviewed per [facility]  PHYSICAL EXAMINATION  GENERAL: no acute distress, normal body habitus RESPIRATORY: breathing is even & unlabored, BS CTAB CARDIAC: RRR, no murmur,no extra heart sounds, +2 RLE edema & +1 LLE edema  LABS/RADIOLOGY: See HPI  ASSESSMENT/PLAN:  Renal insufficiency-renal fcns improved.  CPT CODE: 78469

## 2013-02-05 ENCOUNTER — Ambulatory Visit (HOSPITAL_BASED_OUTPATIENT_CLINIC_OR_DEPARTMENT_OTHER): Payer: Medicare HMO

## 2013-02-05 ENCOUNTER — Ambulatory Visit (HOSPITAL_BASED_OUTPATIENT_CLINIC_OR_DEPARTMENT_OTHER): Payer: Medicare HMO | Admitting: Hematology & Oncology

## 2013-02-05 ENCOUNTER — Other Ambulatory Visit (HOSPITAL_BASED_OUTPATIENT_CLINIC_OR_DEPARTMENT_OTHER): Payer: Commercial Managed Care - HMO | Admitting: Lab

## 2013-02-05 VITALS — BP 131/57 | HR 74 | Temp 97.9°F | Resp 14 | Ht 64.0 in | Wt 113.0 lb

## 2013-02-05 DIAGNOSIS — C787 Secondary malignant neoplasm of liver and intrahepatic bile duct: Secondary | ICD-10-CM

## 2013-02-05 DIAGNOSIS — E3400 Carcinoid syndrome, unspecified: Secondary | ICD-10-CM

## 2013-02-05 DIAGNOSIS — C7A010 Malignant carcinoid tumor of the duodenum: Secondary | ICD-10-CM

## 2013-02-05 DIAGNOSIS — D499 Neoplasm of unspecified behavior of unspecified site: Secondary | ICD-10-CM

## 2013-02-05 LAB — CBC WITH DIFFERENTIAL (CANCER CENTER ONLY)
BASO#: 0 10*3/uL (ref 0.0–0.2)
Eosinophils Absolute: 0 10*3/uL (ref 0.0–0.5)
HCT: 34.9 % (ref 34.8–46.6)
HGB: 11.3 g/dL — ABNORMAL LOW (ref 11.6–15.9)
LYMPH#: 1.1 10*3/uL (ref 0.9–3.3)
MCH: 29.9 pg (ref 26.0–34.0)
MONO#: 0.4 10*3/uL (ref 0.1–0.9)
NEUT#: 4.1 10*3/uL (ref 1.5–6.5)
NEUT%: 73.4 % (ref 39.6–80.0)
Platelets: 214 10*3/uL (ref 145–400)
RBC: 3.78 10*6/uL (ref 3.70–5.32)
WBC: 5.6 10*3/uL (ref 3.9–10.0)

## 2013-02-05 LAB — CMP (CANCER CENTER ONLY)
AST: 18 U/L (ref 11–38)
Alkaline Phosphatase: 74 U/L (ref 26–84)
BUN, Bld: 19 mg/dL (ref 7–22)
Chloride: 102 mEq/L (ref 98–108)
Glucose, Bld: 105 mg/dL (ref 73–118)
Sodium: 140 mEq/L (ref 128–145)
Total Bilirubin: 0.7 mg/dl (ref 0.20–1.60)

## 2013-02-05 MED ORDER — OCTREOTIDE ACETATE 30 MG IM KIT
30.0000 mg | PACK | Freq: Once | INTRAMUSCULAR | Status: AC
  Administered 2013-02-05: 30 mg via INTRAMUSCULAR

## 2013-02-05 NOTE — Progress Notes (Signed)
This office note has been dictated.

## 2013-02-09 NOTE — Progress Notes (Signed)
CC:   [name], M.D.  DIAGNOSIS:  Metastatic carcinoid with liver metastases.  CURRENT THERAPY:  Sandostatin 30 mg IM monthly.  INTERIM HISTORY:  [name] comes in for followup.  She is still at [facility].  She is doing okay there.  She is still very feeble.  She is still not really walking that much.  She has not had any further seizures.  It is still unclear as to why she had these seizures.  She had broken her hip I think in July or August. She did not have any surgery for this.  However, after this, she then had some seizure issues.  Again, I thought that this was all from her poorly controlled hypertension.  Her last chromogranin A was up to 238.  Hopefully, we find that this is coming back down now that we have her back on Sandostatin.  She is not having diarrhea.  She is having no problems with cough.  She is having no wheezing.  There is no flushing.  There is no leg swelling. There is no double vision or blurred vision.  PHYSICAL EXAMINATION:  This is a somewhat frail-appearing, African- American female in no obvious distress.  Vital signs show a temperature of 97.9, pulse 74, respiratory rate 14, blood pressure 131/57.  Weight is 113 pounds.  Head and neck shows a normocephalic, atraumatic skull. There are no ocular or oral lesions.  There are no palpable cervical or supraclavicular lymph nodes.  Lungs are clear bilaterally.  There are no rales, wheezes, or rhonchi.  Cardiac:  Regular rate and rhythm with a normal S1 and S2.  She has an occasional extra beat.  There is a 1/6 systolic ejection murmur.  Abdomen is soft.  She has good bowel sounds. There is no fluid wave.  There is no palpable abdominal mass.  There is no palpable hepatosplenomegaly.  Back:  No tenderness over the spine, ribs, or hips.  Extremities show no clubbing, cyanosis, or edema.  She does have some weakness over on the right side.  Neurological exam shows the weakness  on the right side.  LABORATORY STUDIES:  White cell count is 5.6, hemoglobin 11.3, hematocrit 34.9, platelet count 214.  Sodium 140, potassium 4.2, BUN 19, creatinine 0.8.  Liver function tests are normal.  Albumin is 3.6.  IMPRESSION:  [name] is a 67 year old, African-American female with metastatic carcinoid.  We have been following her for probably 5 or 6 years.  So far, I have really never seen any indication that the carcinoid really is getting out of control and growing.  We will see how her chromogranin A level looks.  Given her current clinical state, I just do not see that we need to really be too aggressive with the carcinoid.  Her performance status is ECOG 3.  We will continue to give her the Sandostatin.  I will plan to see her back myself in another 3 months.  We will have her come back monthly for the Sandostatin __________.    ______________________________ Josph Macho, M.D. PRE/MEDQ  D:  02/05/2013  T:  02/09/2013  Job:  [id number]

## 2013-02-12 DIAGNOSIS — R739 Hyperglycemia, unspecified: Secondary | ICD-10-CM | POA: Insufficient documentation

## 2013-02-12 DIAGNOSIS — N179 Acute kidney failure, unspecified: Secondary | ICD-10-CM | POA: Insufficient documentation

## 2013-02-12 LAB — CHROMOGRANIN A: Chromogranin A: 104 ng/mL — ABNORMAL HIGH (ref 1.9–15.0)

## 2013-02-12 NOTE — Progress Notes (Signed)
Patient ID: [name], female   DOB: 06/11/1945, 67 y.o.   MRN: [id number]        PROGRESS NOTE  DATE: 01/17/2013  FACILITY:  [facility]  LEVEL OF CARE: SNF (31)  Acute Visit  CHIEF COMPLAINT:  Manage hyperglycemia, acute renal failure, and hypercalcemia.    HISTORY OF PRESENT ILLNESS: I was requested by the staff to assess the patient regarding above problem(s):  HYPERGLYCEMIA:  New problem.  On 01/14/2013:  Patient's glucose level was 164.  She does not have a history of diabetes mellitus and she is not on prednisone.   She denies polyuria or polydipsia.    ACUTE RENAL FAILURE:    New problem.  On 01/14/2013:  BUN 22, creatinine 1.15.  In 11/2012:  BUN 15, creatinine 0.88.  Her lisinopril was increased on 01/10/2013.  Patient denies confusion or increasing lower extremity swelling.    HYPERCALCEMIA:  On 01/14/2013:  Calcium level was 10.6.  In 11/2012:  Calcium level 10.5.  In 08/2012:  Ionized calcium 5.7, intact PTH 90.     PAST MEDICAL HISTORY : Reviewed.  No changes.  CURRENT MEDICATIONS: Reviewed per [facility]  REVIEW OF SYSTEMS:  GENERAL: no change in appetite, no fatigue, no weight changes, no fever, chills or weakness RESPIRATORY: no cough, SOB, DOE,, wheezing, hemoptysis CARDIAC: no chest pain or palpitations;  chronic lower extremity swelling   GI: no abdominal pain, diarrhea, constipation, heart burn, nausea or vomiting  PHYSICAL EXAMINATION  GENERAL: no acute distress, normal body habitus EYES: conjunctivae normal, sclerae normal, normal eye lids NECK: supple, trachea midline, no neck masses, no thyroid tenderness, no thyromegaly LYMPHATICS: no LAN in the neck, no supraclavicular LAN RESPIRATORY: breathing is even & unlabored, BS CTAB CARDIAC: RRR, no murmur,no extra heart sounds EDEMA/VARICOSITIES: right lower extremity has +2 edema, left lower extremity has +1 edema    ARTERIAL: pedal pulses nonpalpable    GI: abdomen soft, normal BS, no  masses, no tenderness, no hepatomegaly, no splenomegaly PSYCHIATRIC: the patient is alert & oriented to person, affect & behavior appropriate  ASSESSMENT/PLAN:  Hyperglycemia.  New problem.  Check fasting glucose level and hemoglobin A1c.    Acute renal failure.  New onset.  Significant problem.  Likely secondary to increase in lisinopril.  We will reassess.    Hypercalcemia.  Stable problem.  Secondary to primary hyperparathyroidism.    CPT CODE: 40981

## 2013-02-19 ENCOUNTER — Non-Acute Institutional Stay (SKILLED_NURSING_FACILITY): Payer: Medicare HMO | Admitting: Internal Medicine

## 2013-02-19 DIAGNOSIS — I150 Renovascular hypertension: Secondary | ICD-10-CM

## 2013-02-19 DIAGNOSIS — I6990 Unspecified sequelae of unspecified cerebrovascular disease: Secondary | ICD-10-CM

## 2013-02-19 DIAGNOSIS — D499 Neoplasm of unspecified behavior of unspecified site: Secondary | ICD-10-CM

## 2013-02-19 DIAGNOSIS — E210 Primary hyperparathyroidism: Secondary | ICD-10-CM

## 2013-02-22 ENCOUNTER — Encounter: Payer: Self-pay | Admitting: Internal Medicine

## 2013-02-22 NOTE — Progress Notes (Signed)
PROGRESS NOTE  DATE: 02-19-13  FACILITY: Nursing Home Location: [facility]  LEVEL OF CARE: SNF (31)  Routine Visit  CHIEF COMPLAINT:  Manage hypertension, CVA and carcinoid tumor  HISTORY OF PRESENT ILLNESS:  REASSESSMENT OF ONGOING PROBLEM(S):  HTN: Pt 's HTN remains stable.  Denies CP, sob, DOE, headaches, dizziness or visual disturbances.  No complications from the medications currently being used.  Last BP : 142/72, 120/62, 146/68, 158/78.  CVA: The patient's CVA remains stable.  Patient denies new neurologic symptoms such as numbness, tingling, weakness, speech difficulties or visual disturbances.  No complications reported from the medications currently being used. She has a history of hemorrhagic CVA and residual right-sided hemiparesis.  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 She is status post bowel resection. She is currently followed by the oncologist and is on Sandostatin injections q. monthly. She denies ongoing abdominal pain melena, hematochezia, nausea or vomiting.  PAST MEDICAL HISTORY : Reviewed.  No changes.  CURRENT MEDICATIONS: Reviewed per [facility]  REVIEW OF SYSTEMS: Difficult to obtain, patient is a poor historian  PHYSICAL EXAMINATION  VS:  T 97.2      P 62      RR 18      BP 158/78     POX %     WT (Lb) 115  GENERAL: no acute distress, 10-14 glucose 164, creatinine 1.15, calcium 10.6 otherwise BMP normal normal body habitus EYES: Normal sclerae, normal conjunctivae, no discharge NECK: supple, trachea midline, no neck masses, no thyroid tenderness, no thyromegaly LYMPHATICS: No cervical lymphadenopathy, supraclavicular lymphadenopathy RESPIRATORY: breathing is even & unlabored, BS CTAB CARDIAC: RRR, no murmur,no extra heart sounds, right lower extremity has +2 edema and left lower extremity has +1 edema GI: abdomen soft, normal BS, no masses, no tenderness, no hepatomegaly, no splenomegaly PSYCHIATRIC: the patient is  alert & oriented to person, affect & behavior appropriate  LABS/RADIOLOGY:  11-14 creatinine 1.19, BUN 24  9-14 hemoglobin 10.3, MCV 92 otherwise CBC normal, calcium 10.6 otherwise BMP normal  6/14 calcium 11, ionized calcium 5.7, intact PTH 90, CBC normal, calcium 10.6 otherwise CMP normal  ASSESSMENT/PLAN:  Renovascular hypertension-Uncontrolled.  Start hydralazine 50 mg 3 times a day CVA-continue supportive care. Carcinoid tumor- status post resection. Primary hyperparathyroidism-patient asymptomatic. Will monitor. History of seizure disorder-no episodes off antiseizure medications. Dementia-Exelon patch started. Depression-Remeron started Check liver profile  CPT CODE: 45409

## 2013-02-26 NOTE — Progress Notes (Signed)
CC:   [facility], [place] [phone]  DIAGNOSES: 1. Metastatic carcinoid tumor with liver metastases. 2. Recent onset of seizures.  CURRENT THERAPY:  Sandostatin 30 mg IM every month.  INTERIM HISTORY:  [name] comes in for a followup.  It seems like things are going rough for her right now.  We last saw her back in July. At that point in time, she had recently broken her right hip.  This was a closed fracture.  She did not require any surgery for this.  However unfortunately, she now is having issues with seizures.  She has had a thorough evaluation for this.  I think she was hospitalized back about 2 weeks ago.  She has some mental status issues.  Again, she had I think recurrent seizures.  She had CT of the brain which was unremarkable.  MRI of the brain was done which showed some motion degradation.  She had a remote left basal ganglia infarct.  There is some cerebellar infarct.  Otherwise, there is nothing that would suggest an etiology for the seizures.  I am sure she was seen by Neurology.  I am not sure if she had any type of EEG or other workup.  She is in today.  She __________ at the nursing home.  Blood pressure is still an issue.  We will see about increasing her clonidine.  She may also could benefit from Maxzide at a low dose.  She really does not get around all that much.  She is in a wheelchair. She has always had some ambulatory issues because of the past CVA.  Her appetite seems to be doing okay.  She has lost a little weight since we last saw her.  PHYSICAL EXAMINATION:  General:  This is a somewhat elderly-appearing African American female in no obvious distress.  Vital signs: Temperature of 98.3, pulse 82, respiratory rate 18, blood pressure 177/58.  Weight is 105.  Head and neck:  Normocephalic, atraumatic skull.  There are no ocular or oral lesions.  There are no palpable cervical or supraclavicular lymph nodes.  Lungs:   Clear bilaterally. Cardiac:  Regular rate and rhythm with occasional extra beat.  She has no murmurs, rubs, or bruits.  Abdomen:  Soft.  She has good bowel sounds.  There is no abdominal mass.  There is no palpable hepatosplenomegaly.  Extremities:  Some weakness in her legs.  This is symmetric.  Neurologic:  No focal neurological deficits.  LABORATORY STUDIES:  White cell count is 6.3, hemoglobin 11.3, hematocrit 34.9, platelet count 247,000.  Her last chromogranin A level was 106.  IMPRESSION:  [name] is a very charming, 67 year old, African American female with longstanding history of carcinoid.  We have had her on Sandostatin.  This really has done well for her.  We will see what her next chromogranin A level is.  This will be interesting to obtain.  Again, I do not see how the carcinoid would be related to the seizures. I do not see her having carcinoid syndrome.  Again, I believe that the seizures may be from hypertension.  Her blood pressure has really been very difficult to control.  We plan to give her the Sandostatin today.  I will plan to see her back in 2 months.    ______________________________ Josph Macho, M.D. PRE/MEDQ  D:  12/11/2012  T:  02/24/2013  Job:  [id number]

## 2013-02-27 NOTE — Progress Notes (Signed)
Patient ID: [name], female   DOB: [birth year], 67 y.o.   MRN: [id number]     [facility]  No Known Allergies  Chief Complaint  Patient presents with  . Medical Managment of Chronic Issues    HPI She is being seen for the management of her chronic illnesses. There are no concerns being voiced by the nursing staff at this time. She is not voicing any concerns or complaints. There has not beena significant change in her recent status.   Past Medical History  Diagnosis Date  . 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 Myna Hidalgo w/sandostatin injections q monthly  . Anemia     iron deficiency  . Depression   . Hyperlipidemia   . Hypertension   . History of cocaine abuse     quit 03/06; seizure and HTN urgency secondary to use 12/04  . Seizures   . 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 Weight loss     15 lbs 08/07 (regained 104 08/07 and 112 lbs 12/07)  . Arthritis   . Halitosis     per notes  . Herpes zoster of eye 04/09    right eye  . Chronic kidney disease, stage III (moderate)     BL Cr 1.2-1.3    Past Surgical History  Procedure Laterality Date  . Hip arthroplasty Right 07/24/2012    Procedure: ARTHROPLASTY BIPOLAR HIP;  Surgeon: Verlee Rossetti, MD;  Location: [facility];  Service: Orthopedics;  Laterality: Right;    Filed Vitals:   10/08/12 1256  BP: 130/89  Pulse: 60  Height: 5\' 2"  (1.575 m)  Weight: 115 lb (52.164 kg)    MEDICATIONS norvasc 10 mg daily Lisinopril 20 mg daily mvi daily Clonidine 0.2 mg three times daily   LABS REVIEWED;   09-05-12: wbc 3.9; hgb 11.5; hct 34.6; mcv 90; plt 256; glucose 75; bun 18; creat 0.79; k+4.3; na++143 Ca++ 10.6; liver normal albumin 4.2    09-06-12: ca++ 11.0; ptsh 90; ionized ca++ 5.7   Review of Systems  Constitutional: Negative for malaise/fatigue.  Eyes: Negative for blurred vision.  Respiratory: Negative for cough, shortness of breath and wheezing.   Cardiovascular: Negative for chest pain and palpitations.  Gastrointestinal: Negative for heartburn and abdominal pain.  Musculoskeletal: Negative for joint pain and myalgias.  Skin: Negative.   Neurological: Negative for dizziness and headaches.  Psychiatric/Behavioral: Negative for depression. The patient is nervous/anxious.      Physical Exam  Constitutional: She is oriented to person, place, and time. No distress.  Thin   Neck: Neck supple. No JVD present.  Cardiovascular: Normal rate and regular rhythm.   GI: Soft. Bowel sounds are normal. She exhibits no distension. There is no tenderness.  Musculoskeletal: She exhibits no edema.  Neurological: She is alert and oriented to person, place, and time.  Skin: Skin is warm and dry. She is not diaphoretic.  Psychiatric: She has a normal mood and affect.     ASSESSMENT/PLAN  1. Hypertension: is stable will continue norvasc 10 mg daily; lisinopril 20 mg daily; clonidine 0.2 mg three times daily will monitor  2. Status post right hip fracture: she is stable will not make changes at this time; will continue  to monitor her status   3. Hypercalcemia: her ionized ca++ is 5.7 she has hyperparathyroidism; will continue to monitor her status  4. CKD III: her renal function is stable at this time; will monitor

## 2013-03-05 ENCOUNTER — Other Ambulatory Visit: Payer: Medicare HMO | Admitting: Lab

## 2013-03-05 ENCOUNTER — Ambulatory Visit: Payer: Medicare HMO

## 2013-03-26 ENCOUNTER — Non-Acute Institutional Stay (SKILLED_NURSING_FACILITY): Payer: Medicare HMO | Admitting: Internal Medicine

## 2013-03-26 DIAGNOSIS — I150 Renovascular hypertension: Secondary | ICD-10-CM

## 2013-03-26 DIAGNOSIS — I6990 Unspecified sequelae of unspecified cerebrovascular disease: Secondary | ICD-10-CM

## 2013-03-26 DIAGNOSIS — E210 Primary hyperparathyroidism: Secondary | ICD-10-CM

## 2013-03-26 DIAGNOSIS — D499 Neoplasm of unspecified behavior of unspecified site: Secondary | ICD-10-CM

## 2013-03-26 NOTE — Progress Notes (Signed)
PROGRESS NOTE  DATE: 03-26-13  FACILITY: Nursing Home Location: [facility]  LEVEL OF CARE: SNF (31)  Routine Visit  CHIEF COMPLAINT:  Manage hypertension, CVA and carcinoid tumor  HISTORY OF PRESENT ILLNESS:  REASSESSMENT OF ONGOING PROBLEM(S):  HTN: Pt 's HTN remains stable.  Denies CP, sob, DOE, headaches, dizziness or visual disturbances.  No complications from the medications currently being used.  Last BP : 142/72, 120/62, 146/68, 158/78, 110/64.  CVA: The patient's CVA remains stable.  Patient denies new neurologic symptoms such as numbness, tingling, weakness, speech difficulties or visual disturbances.  No complications reported from the medications currently being used. She has a history of hemorrhagic CVA and residual right-sided hemiparesis.  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 She is status post bowel resection. She is currently followed by the oncologist and is on Sandostatin injections q. monthly. She denies ongoing abdominal pain melena, hematochezia, nausea or vomiting.  PAST MEDICAL HISTORY : Reviewed.  No changes.  CURRENT MEDICATIONS: Reviewed per [facility]  REVIEW OF SYSTEMS: Difficult to obtain, patient is a poor historian  PHYSICAL EXAMINATION  VS:  T 98      P 86      RR 16      BP 110/64      WT (Lb) 122  GENERAL: no acute distress, normal body habitus NECK: supple, trachea midline, no neck masses, no thyroid tenderness, no thyromegaly RESPIRATORY: breathing is even & unlabored, BS CTAB CARDIAC: RRR, no murmur,no extra heart sounds, right lower extremity has +2 edema and left lower extremity has +1 edema GI: abdomen soft, normal BS, no masses, no tenderness, no hepatomegaly, no splenomegaly PSYCHIATRIC: the patient is alert & oriented to person, affect & behavior appropriate  LABS/RADIOLOGY:  12-14 liver profile normal  11-14 creatinine 1.09, BUN 24 10-14 glucose 164, creatinine 1.15, calcium 10.6 otherwise BMP  normal  9-14 hemoglobin 10.3, MCV 92 otherwise CBC normal, calcium 10.6 otherwise BMP normal  6/14 calcium 11, ionized calcium 5.7, intact PTH 90, CBC normal, calcium 10.6 otherwise CMP normal  ASSESSMENT/PLAN:  Renovascular hypertension-well controlled CVA-continue supportive care. Carcinoid tumor- status post resection. Primary hyperparathyroidism-patient asymptomatic. Will monitor. History of seizure disorder-no episodes off antiseizure medications. Dementia-Depakote was started Depression-on Remeron Check Depakote level  CPT CODE: [id number]

## 2013-04-02 ENCOUNTER — Ambulatory Visit: Payer: Medicare HMO

## 2013-04-02 ENCOUNTER — Other Ambulatory Visit: Payer: Medicare HMO | Admitting: Lab

## 2013-04-22 ENCOUNTER — Telehealth: Payer: Self-pay | Admitting: Hematology & Oncology

## 2013-04-22 NOTE — Telephone Encounter (Signed)
[place]: [id number] Status: Approved Dates: 05/01/2013 - 07/30/2013

## 2013-04-26 ENCOUNTER — Telehealth: Payer: Self-pay | Admitting: Hematology & Oncology

## 2013-04-26 NOTE — Telephone Encounter (Signed)
[employer] Authorization [name]: [id number] Status: Approved  Dates: 05/01/2013 - 10/29/2013 12 visits  [id number]

## 2013-04-30 ENCOUNTER — Other Ambulatory Visit: Payer: Medicare HMO | Admitting: Lab

## 2013-04-30 ENCOUNTER — Ambulatory Visit: Payer: Medicare HMO | Admitting: Hematology & Oncology

## 2013-04-30 ENCOUNTER — Ambulatory Visit: Payer: Medicare HMO

## 2013-05-01 ENCOUNTER — Ambulatory Visit: Payer: Medicare HMO | Admitting: Hematology & Oncology

## 2013-05-01 ENCOUNTER — Other Ambulatory Visit: Payer: Commercial Managed Care - HMO | Admitting: Lab

## 2013-05-01 ENCOUNTER — Ambulatory Visit (HOSPITAL_BASED_OUTPATIENT_CLINIC_OR_DEPARTMENT_OTHER): Payer: Commercial Managed Care - HMO

## 2013-05-01 DIAGNOSIS — D499 Neoplasm of unspecified behavior of unspecified site: Secondary | ICD-10-CM

## 2013-05-01 DIAGNOSIS — E340 Carcinoid syndrome: Secondary | ICD-10-CM

## 2013-05-01 DIAGNOSIS — C787 Secondary malignant neoplasm of liver and intrahepatic bile duct: Secondary | ICD-10-CM

## 2013-05-01 MED ORDER — OCTREOTIDE ACETATE 30 MG IM KIT
PACK | INTRAMUSCULAR | Status: AC
  Filled 2013-05-01: qty 1

## 2013-05-01 MED ORDER — OCTREOTIDE ACETATE 30 MG IM KIT
30.0000 mg | PACK | Freq: Once | INTRAMUSCULAR | Status: AC
  Administered 2013-05-01: 30 mg via INTRAMUSCULAR

## 2013-05-09 ENCOUNTER — Other Ambulatory Visit: Payer: Medicare HMO | Admitting: Lab

## 2013-05-09 ENCOUNTER — Ambulatory Visit: Payer: Medicare HMO | Admitting: Hematology & Oncology

## 2013-05-09 ENCOUNTER — Ambulatory Visit: Payer: Medicare HMO

## 2013-06-19 DEATH — deceased

## 2013-11-13 ENCOUNTER — Other Ambulatory Visit: Payer: Self-pay | Admitting: Pharmacist

## 2014-07-18 IMAGING — CR DG HIP COMPLETE 2+V*R*
3 series · 3 of 3 positions shown · non-contrast
Comparison: None.

CLINICAL DATA: Right hip pain.

RIGHT HIP - COMPLETE 2+ VIEW

[t pelvis a.p.]
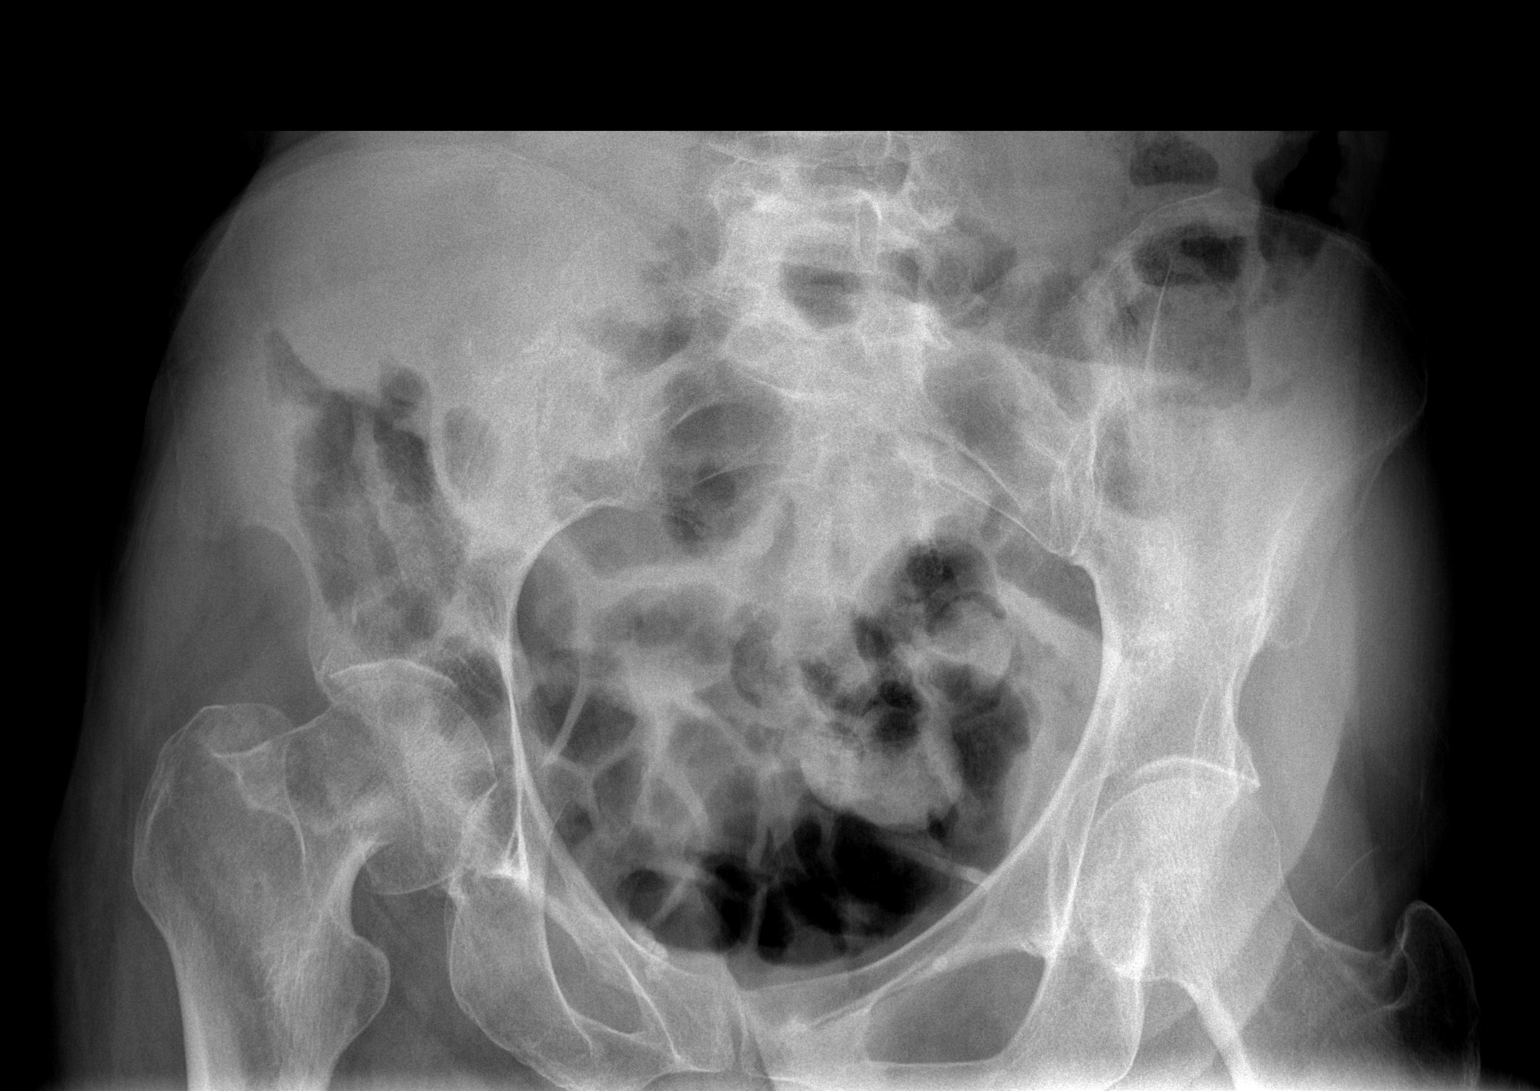

[t hip ap right]
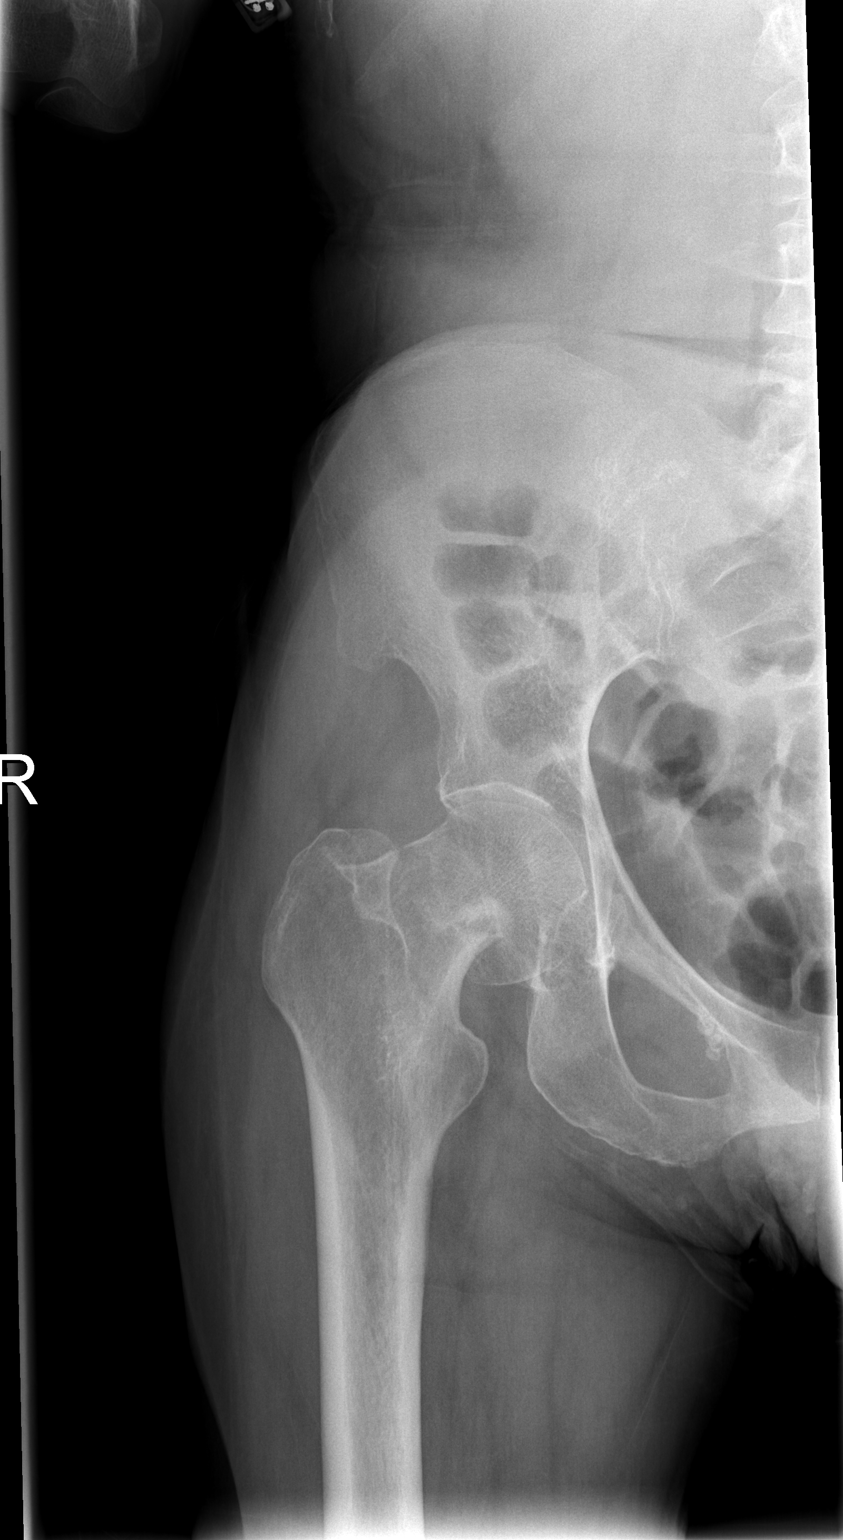

[t hip frog leg right]
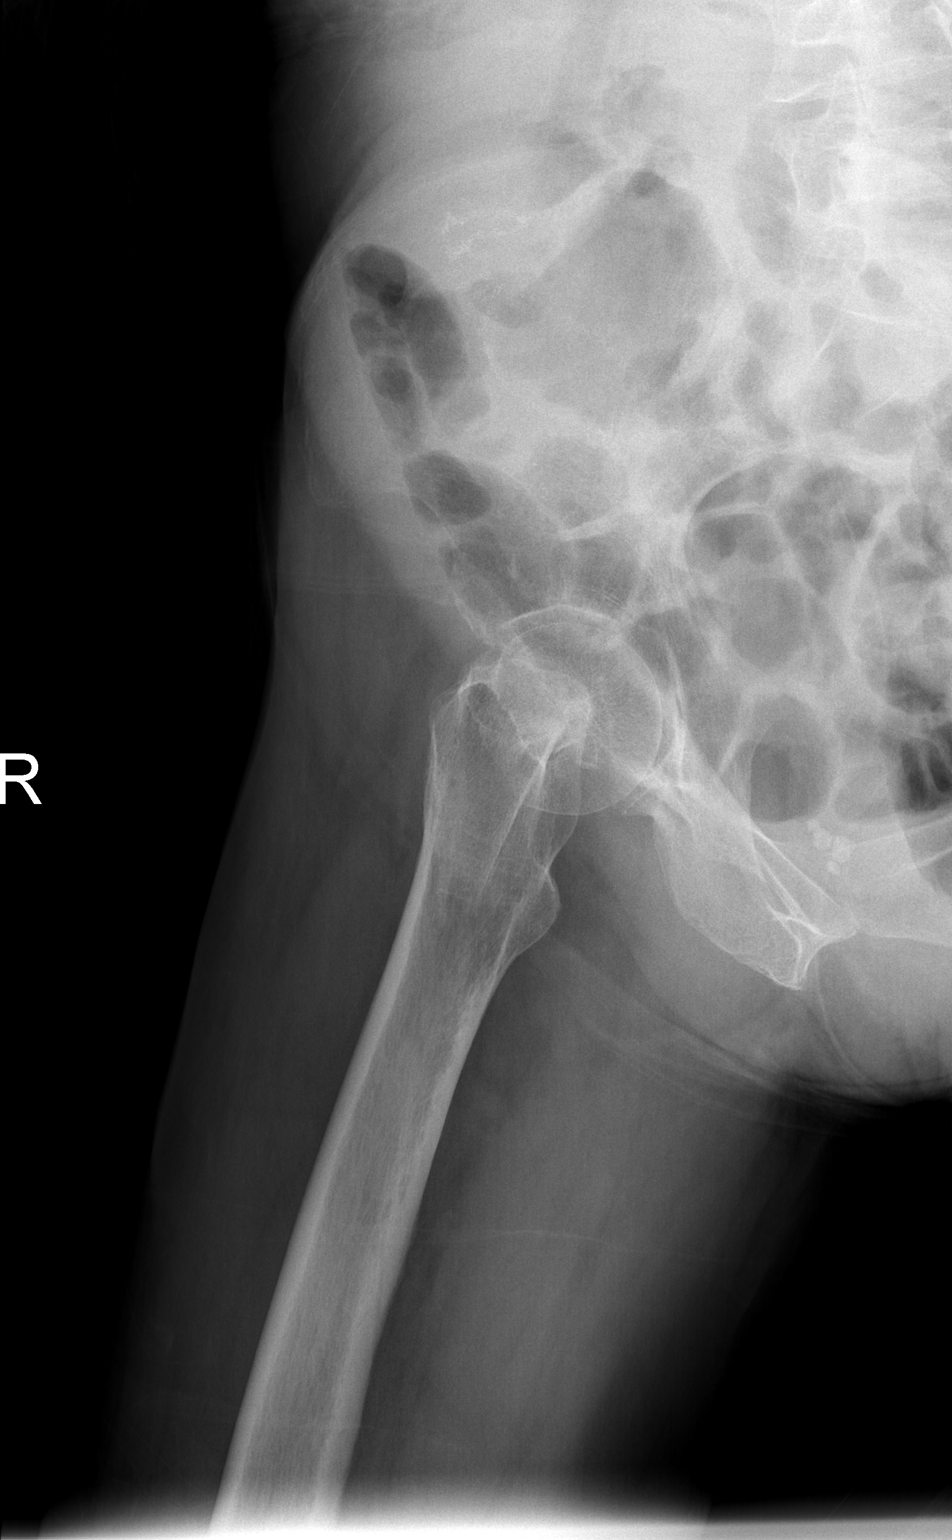

[3 of 3 positions shown; findings below may reference images not displayed]

FINDINGS: The patient has an acute subcapital fracture right hip.
No other bony or joint abnormality is identified.
IMPRESSION: Acute subcapital fracture right hip.

## 2014-11-24 IMAGING — CR DG CHEST 2V
2 series · 2 of 2 positions shown · non-contrast
Comparison: 05/28/2008 and thoracic spine CT examinations dated
07/23/2009 and 04/02/2009.

CLINICAL DATA: Altered mental status.

EXAM:
CHEST  2 VIEW

[x chest ap]
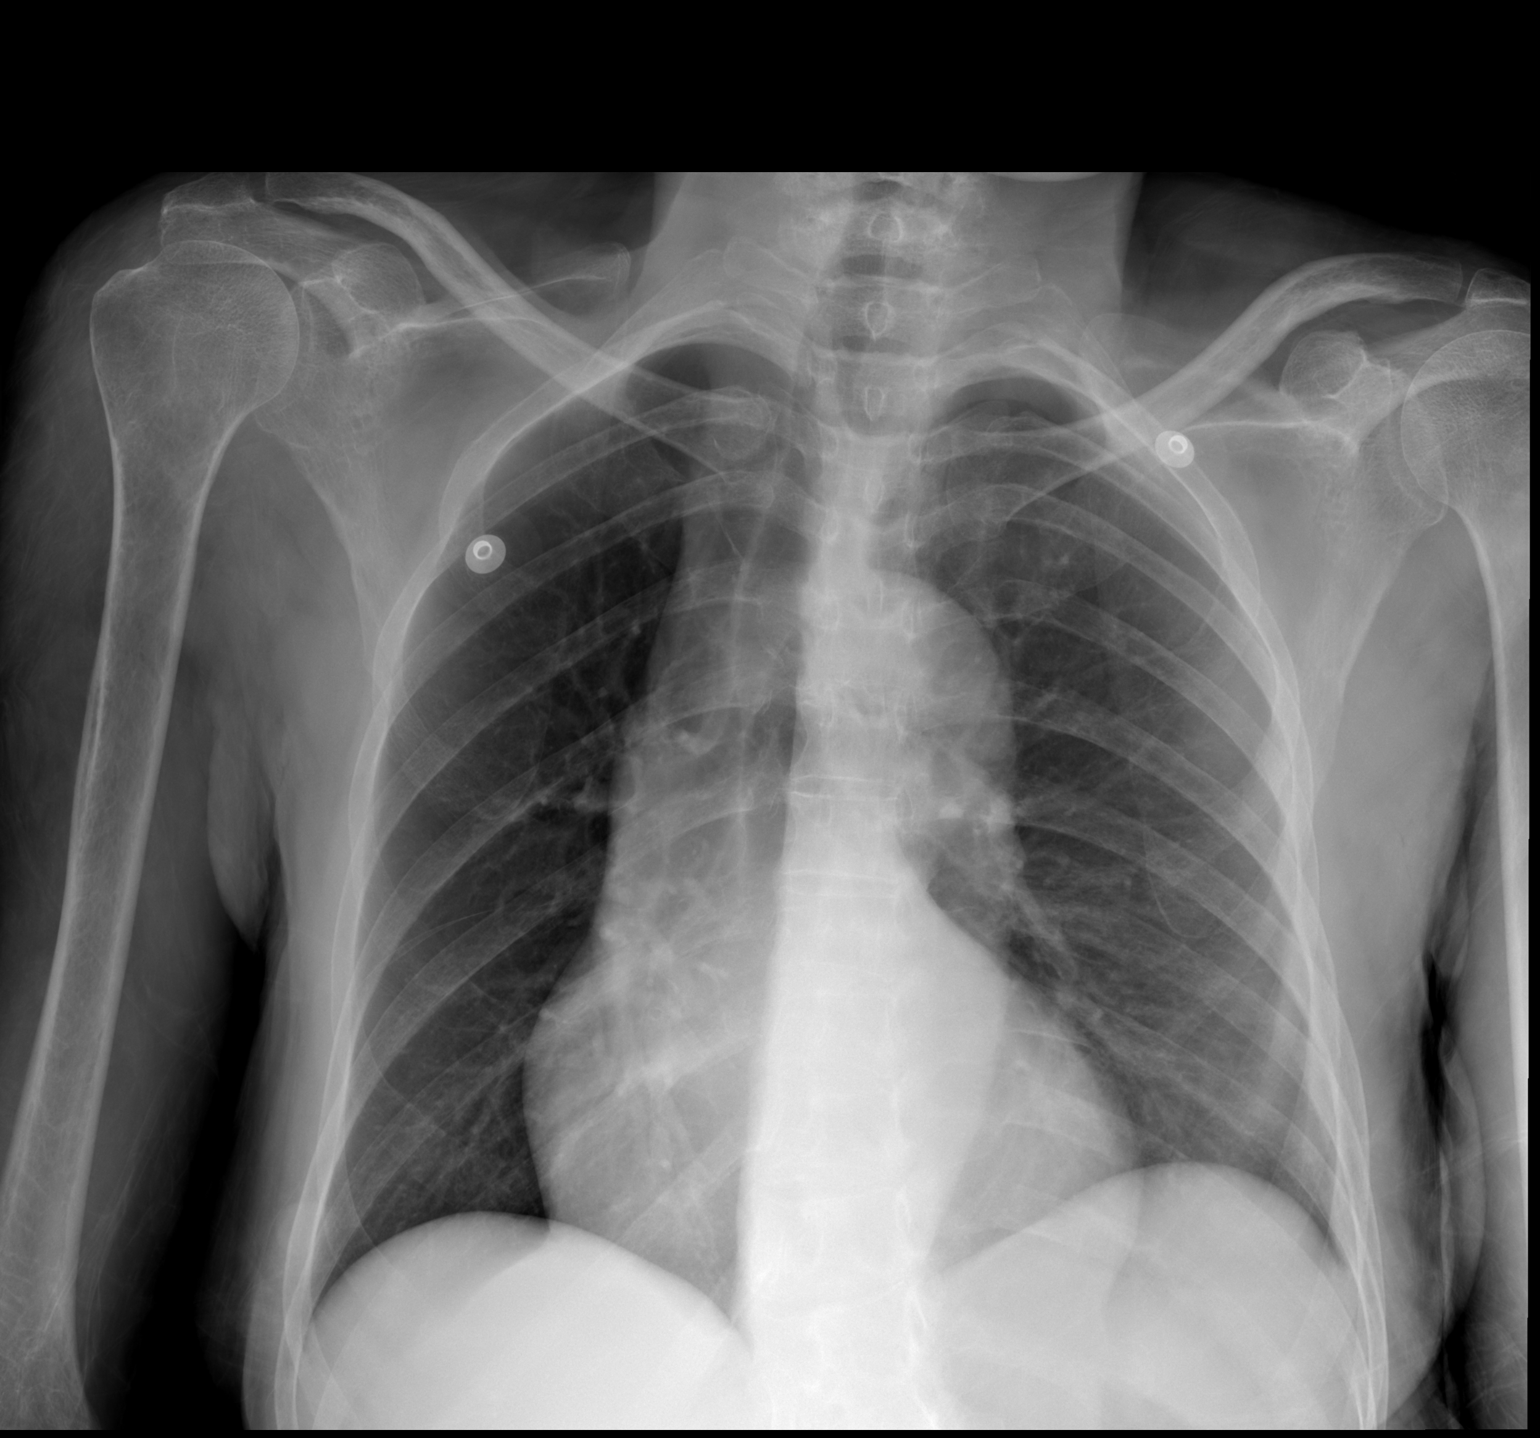

[w chest lat]
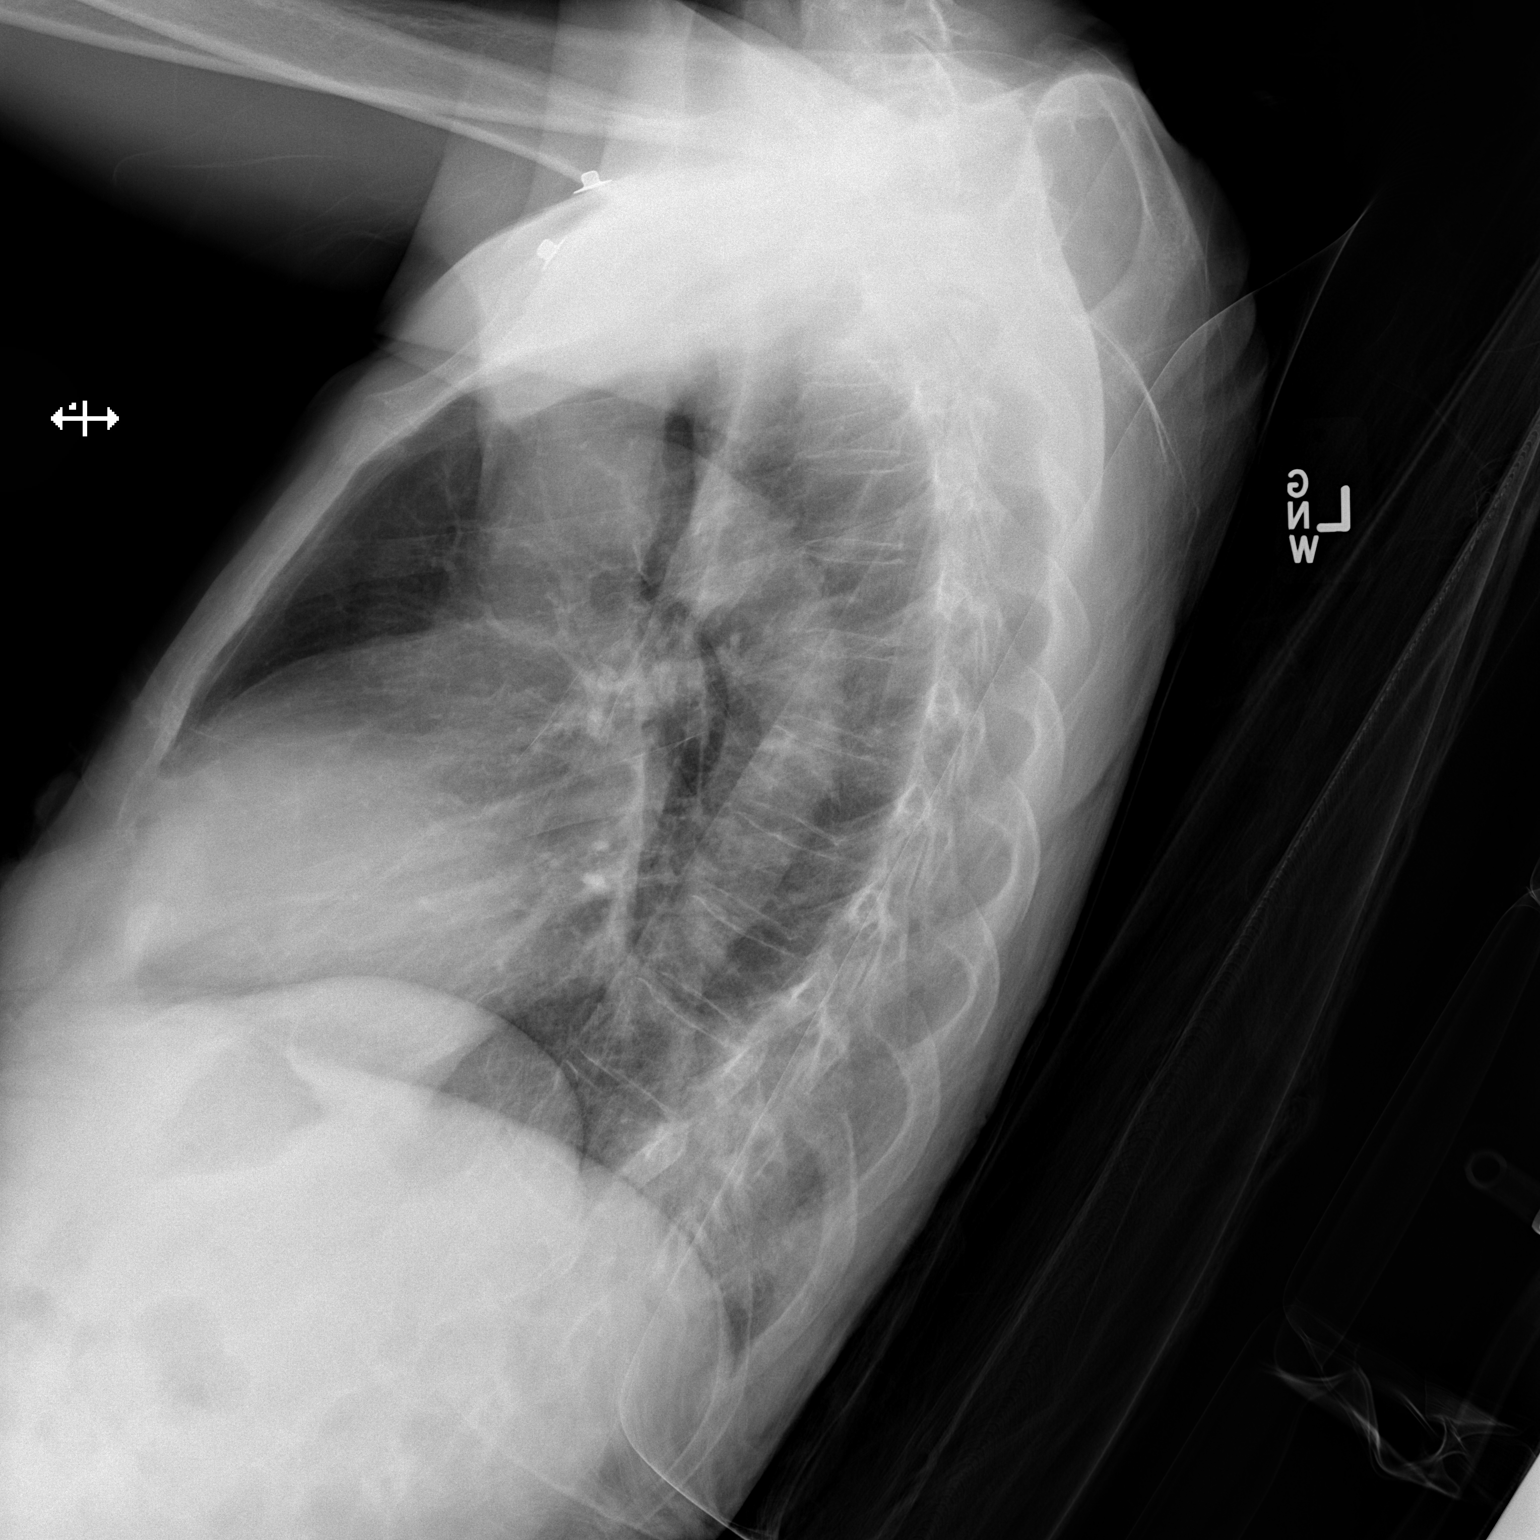

[2 of 2 positions shown; findings below may reference images not displayed]

FINDINGS: Heart size near the upper limit of normal. Clear lungs. Mild
scoliosis.
IMPRESSION: No acute abnormality.

## 2017-07-19 DEATH — deceased
